# Patient Record
Sex: Male | Born: 1980 | Race: Black or African American | Hispanic: No | Marital: Married | Smoking: Current every day smoker
Health system: Southern US, Community
[De-identification: ages and names within clinical notes are randomized; demographics above are authoritative.]

## PROBLEM LIST (undated history)

## (undated) DIAGNOSIS — G4733 Obstructive sleep apnea (adult) (pediatric): Secondary | ICD-10-CM

## (undated) DIAGNOSIS — R001 Bradycardia, unspecified: Secondary | ICD-10-CM

## (undated) DIAGNOSIS — J45909 Unspecified asthma, uncomplicated: Secondary | ICD-10-CM

## (undated) DIAGNOSIS — I472 Ventricular tachycardia: Secondary | ICD-10-CM

## (undated) DIAGNOSIS — Z8489 Family history of other specified conditions: Secondary | ICD-10-CM

## (undated) DIAGNOSIS — I441 Atrioventricular block, second degree: Secondary | ICD-10-CM

## (undated) DIAGNOSIS — I4729 Other ventricular tachycardia: Secondary | ICD-10-CM

## (undated) DIAGNOSIS — E119 Type 2 diabetes mellitus without complications: Secondary | ICD-10-CM

## (undated) DIAGNOSIS — I10 Essential (primary) hypertension: Secondary | ICD-10-CM

## (undated) DIAGNOSIS — I5042 Chronic combined systolic (congestive) and diastolic (congestive) heart failure: Secondary | ICD-10-CM

## (undated) HISTORY — DX: Bradycardia, unspecified: R00.1

## (undated) HISTORY — DX: Other ventricular tachycardia: I47.29

## (undated) HISTORY — DX: Obstructive sleep apnea (adult) (pediatric): G47.33

## (undated) HISTORY — DX: Unspecified asthma, uncomplicated: J45.909

## (undated) HISTORY — DX: Chronic combined systolic (congestive) and diastolic (congestive) heart failure: I50.42

## (undated) HISTORY — DX: Ventricular tachycardia: I47.2

## (undated) HISTORY — DX: Essential (primary) hypertension: I10

## (undated) HISTORY — DX: Type 2 diabetes mellitus without complications: E11.9

## (undated) HISTORY — DX: Morbid (severe) obesity due to excess calories: E66.01

## (undated) HISTORY — DX: Atrioventricular block, second degree: I44.1

## (undated) HISTORY — PX: NO PAST SURGERIES: SHX2092

---

## 2000-09-17 ENCOUNTER — Encounter: Payer: Self-pay | Admitting: *Deleted

## 2000-09-17 ENCOUNTER — Ambulatory Visit (HOSPITAL_COMMUNITY): Admission: RE | Admit: 2000-09-17 | Discharge: 2000-09-17 | Payer: Self-pay | Admitting: *Deleted

## 2001-02-14 ENCOUNTER — Encounter: Payer: Self-pay | Admitting: Family Medicine

## 2001-02-14 ENCOUNTER — Ambulatory Visit (HOSPITAL_COMMUNITY): Admission: RE | Admit: 2001-02-14 | Discharge: 2001-02-14 | Payer: Self-pay | Admitting: Family Medicine

## 2001-04-14 ENCOUNTER — Encounter: Payer: Self-pay | Admitting: *Deleted

## 2001-04-14 ENCOUNTER — Ambulatory Visit: Admission: RE | Admit: 2001-04-14 | Discharge: 2001-04-14 | Payer: Self-pay | Admitting: *Deleted

## 2005-07-10 ENCOUNTER — Ambulatory Visit: Payer: Self-pay | Admitting: Internal Medicine

## 2005-07-24 ENCOUNTER — Ambulatory Visit: Payer: Self-pay | Admitting: Internal Medicine

## 2010-02-07 ENCOUNTER — Emergency Department (HOSPITAL_COMMUNITY): Admission: EM | Admit: 2010-02-07 | Discharge: 2010-02-07 | Payer: Self-pay | Admitting: Family Medicine

## 2010-02-07 ENCOUNTER — Emergency Department (HOSPITAL_COMMUNITY): Admission: EM | Admit: 2010-02-07 | Discharge: 2010-02-07 | Payer: Self-pay | Admitting: Emergency Medicine

## 2011-03-09 LAB — CBC
HCT: 44.6 % (ref 39.0–52.0)
Hemoglobin: 15.2 g/dL (ref 13.0–17.0)
MCHC: 34.1 g/dL (ref 30.0–36.0)
MCV: 83 fL (ref 78.0–100.0)
Platelets: 255 10*3/uL (ref 150–400)
RBC: 5.37 MIL/uL (ref 4.22–5.81)
RDW: 13.9 % (ref 11.5–15.5)
WBC: 8.5 10*3/uL (ref 4.0–10.5)

## 2011-03-09 LAB — DIFFERENTIAL
Basophils Absolute: 0 10*3/uL (ref 0.0–0.1)
Eosinophils Absolute: 0 10*3/uL (ref 0.0–0.7)
Lymphs Abs: 0.5 10*3/uL — ABNORMAL LOW (ref 0.7–4.0)
Neutrophils Relative %: 83 % — ABNORMAL HIGH (ref 43–77)

## 2011-03-09 LAB — BRAIN NATRIURETIC PEPTIDE: Pro B Natriuretic peptide (BNP): 34 pg/mL (ref 0.0–100.0)

## 2012-09-23 ENCOUNTER — Emergency Department (INDEPENDENT_AMBULATORY_CARE_PROVIDER_SITE_OTHER)
Admission: EM | Admit: 2012-09-23 | Discharge: 2012-09-23 | Disposition: A | Discharge status: Home / Self Care | Payer: Self-pay | Source: Home / Self Care | Attending: Family Medicine | Admitting: Family Medicine

## 2012-09-23 ENCOUNTER — Encounter (HOSPITAL_COMMUNITY): Payer: Self-pay | Admitting: *Deleted

## 2012-09-23 DIAGNOSIS — H109 Unspecified conjunctivitis: Secondary | ICD-10-CM

## 2012-09-23 MED ORDER — TOBRAMYCIN 0.3 % OP SOLN: 1.0000 [drp] | OPHTHALMIC | Status: DC

## 2012-09-23 NOTE — ED Provider Notes (Signed)
History     CSN: [id number]  Arrival date & time 09/23/12  1334   None     Chief Complaint  Patient presents with  . Conjunctivitis    (Consider location/radiation/quality/duration/timing/severity/associated sxs/prior treatment) Patient is a 31 y.o. male presenting with conjunctivitis. The history is provided by the patient. No language interpreter was used.  Conjunctivitis  The current episode started 3 to 5 days ago. The onset was gradual. The problem has been gradually worsening. The problem is moderate. Nothing relieves the symptoms. Nothing aggravates the symptoms. Associated symptoms include eye redness.   Pt complains of redness to both eyes.  Pt reports drainage in am Past Medical History  Diagnosis Date  . Hypertension     History reviewed. No pertinent past surgical history.  Family History  Problem Relation Age of Onset  . Family history unknown: Yes    History  Substance Use Topics  . Smoking status: Never Smoker   . Smokeless tobacco: Not on file  . Alcohol Use: No      Review of Systems  Eyes: Positive for redness.  All other systems reviewed and are negative.    Allergies  Penicillins  Home Medications  No current outpatient prescriptions on file.  BP 167/97  Pulse 92  Temp 98.5 F (36.9 C) (Oral)  Resp 16  SpO2 100%  Physical Exam  Nursing note and vitals reviewed. Constitutional: He is oriented to person, place, and time. He appears well-developed and well-nourished.  HENT:  Head: Normocephalic and atraumatic.  Right Ear: External ear normal.  Left Ear: External ear normal.  Eyes: Conjunctivae normal are normal. Pupils are equal, round, and reactive to light. Right eye exhibits discharge. Left eye exhibits discharge.  Neck: Normal range of motion.  Cardiovascular: Normal rate.   Pulmonary/Chest: Effort normal.  Musculoskeletal: Normal range of motion.  Neurological: He is alert and oriented to person, place, and time. He has  normal reflexes.  Skin: Skin is warm.    ED Course  Procedures (including critical care time)  Labs Reviewed - No data to display No results found.   1. Conjunctivitis       MDM  tobrex opth and warm compresses        Lonia Skinner [place], [place] 09/23/12 1500  Lonia Skinner [place], [place] 09/23/12 1720  Lonia Skinner [place], [place] 09/23/12 1721  Lonia Skinner [place], [place] 09/23/12 1721  Lonia Skinner [place], [place] 09/23/12 1722  Lonia Skinner [place], [place] 09/23/12 1725

## 2012-09-23 NOTE — ED Notes (Signed)
Pt reports redness and drainage in both eyes since friday

## 2012-09-23 NOTE — ED Provider Notes (Signed)
Medical screening examination/treatment/procedure(s) were performed by resident physician or non-physician practitioner and as supervising physician I was immediately available for consultation/collaboration.   Art Levan DOUGLAS MD.    Annaclaire Walsworth D Rayshell Goecke, MD 09/23/12 2059 

## 2015-02-17 ENCOUNTER — Encounter: Payer: Self-pay | Admitting: Family

## 2015-03-05 ENCOUNTER — Emergency Department (HOSPITAL_COMMUNITY): Payer: 59

## 2015-03-05 ENCOUNTER — Encounter (HOSPITAL_COMMUNITY): Payer: Self-pay | Admitting: *Deleted

## 2015-03-05 ENCOUNTER — Inpatient Hospital Stay (HOSPITAL_COMMUNITY)
Admission: EM | Admit: 2015-03-05 | Discharge: 2015-03-08 | DRG: 292 | Disposition: A | Discharge status: Home / Self Care | Payer: 59 | Attending: Oncology | Admitting: Oncology

## 2015-03-05 DIAGNOSIS — R188 Other ascites: Secondary | ICD-10-CM | POA: Diagnosis present

## 2015-03-05 DIAGNOSIS — R800 Isolated proteinuria: Secondary | ICD-10-CM | POA: Diagnosis not present

## 2015-03-05 DIAGNOSIS — G4733 Obstructive sleep apnea (adult) (pediatric): Secondary | ICD-10-CM

## 2015-03-05 DIAGNOSIS — I472 Ventricular tachycardia: Secondary | ICD-10-CM | POA: Diagnosis present

## 2015-03-05 DIAGNOSIS — I110 Hypertensive heart disease with heart failure: Secondary | ICD-10-CM | POA: Diagnosis present

## 2015-03-05 DIAGNOSIS — Z880 Allergy status to penicillin: Secondary | ICD-10-CM | POA: Diagnosis not present

## 2015-03-05 DIAGNOSIS — I5041 Acute combined systolic (congestive) and diastolic (congestive) heart failure: Secondary | ICD-10-CM

## 2015-03-05 DIAGNOSIS — E785 Hyperlipidemia, unspecified: Secondary | ICD-10-CM | POA: Diagnosis present

## 2015-03-05 DIAGNOSIS — N049 Nephrotic syndrome with unspecified morphologic changes: Secondary | ICD-10-CM | POA: Diagnosis present

## 2015-03-05 DIAGNOSIS — I43 Cardiomyopathy in diseases classified elsewhere: Secondary | ICD-10-CM | POA: Diagnosis present

## 2015-03-05 DIAGNOSIS — I5043 Acute on chronic combined systolic (congestive) and diastolic (congestive) heart failure: Secondary | ICD-10-CM | POA: Diagnosis present

## 2015-03-05 DIAGNOSIS — R0602 Shortness of breath: Secondary | ICD-10-CM | POA: Diagnosis not present

## 2015-03-05 DIAGNOSIS — J449 Chronic obstructive pulmonary disease, unspecified: Secondary | ICD-10-CM | POA: Diagnosis present

## 2015-03-05 DIAGNOSIS — I272 Other secondary pulmonary hypertension: Secondary | ICD-10-CM | POA: Diagnosis present

## 2015-03-05 DIAGNOSIS — R0600 Dyspnea, unspecified: Secondary | ICD-10-CM

## 2015-03-05 DIAGNOSIS — R609 Edema, unspecified: Secondary | ICD-10-CM

## 2015-03-05 DIAGNOSIS — E119 Type 2 diabetes mellitus without complications: Secondary | ICD-10-CM | POA: Diagnosis present

## 2015-03-05 DIAGNOSIS — I701 Atherosclerosis of renal artery: Secondary | ICD-10-CM | POA: Diagnosis present

## 2015-03-05 DIAGNOSIS — I441 Atrioventricular block, second degree: Secondary | ICD-10-CM | POA: Diagnosis not present

## 2015-03-05 DIAGNOSIS — E662 Morbid (severe) obesity with alveolar hypoventilation: Secondary | ICD-10-CM | POA: Diagnosis present

## 2015-03-05 DIAGNOSIS — I10 Essential (primary) hypertension: Secondary | ICD-10-CM

## 2015-03-05 DIAGNOSIS — F17210 Nicotine dependence, cigarettes, uncomplicated: Secondary | ICD-10-CM | POA: Diagnosis present

## 2015-03-05 DIAGNOSIS — I4510 Unspecified right bundle-branch block: Secondary | ICD-10-CM | POA: Diagnosis present

## 2015-03-05 DIAGNOSIS — I429 Cardiomyopathy, unspecified: Secondary | ICD-10-CM

## 2015-03-05 HISTORY — DX: Family history of other specified conditions: Z84.89

## 2015-03-05 LAB — BASIC METABOLIC PANEL
ANION GAP: 7 (ref 5–15)
BUN: 8 mg/dL (ref 6–23)
CO2: 31 mmol/L (ref 19–32)
Calcium: 8.6 mg/dL (ref 8.4–10.5)
Chloride: 103 mmol/L (ref 96–112)
Creatinine, Ser: 1.07 mg/dL (ref 0.50–1.35)
GFR calc Af Amer: >90 mL/min (ref >90)
GFR calc non Af Amer: 90 mL/min — ABNORMAL LOW (ref >90)
GLUCOSE: 115 mg/dL — AB (ref 70–99)
Potassium: 3.9 mmol/L (ref 3.5–5.1)
Sodium: 141 mmol/L (ref 135–145)

## 2015-03-05 LAB — I-STAT TROPONIN, ED: Troponin i, poc: 0.05 ng/mL (ref 0.00–0.08)

## 2015-03-05 LAB — URINALYSIS, ROUTINE W REFLEX MICROSCOPIC
Bilirubin Urine: NEGATIVE
GLUCOSE, UA: NEGATIVE mg/dL
Ketones, ur: NEGATIVE mg/dL
LEUKOCYTES UA: NEGATIVE
Nitrite: NEGATIVE
Protein, ur: 100 mg/dL — AB
Specific Gravity, Urine: 1.012 (ref 1.005–1.030)
UROBILINOGEN UA: 1 mg/dL (ref 0.0–1.0)
pH: 5.5 (ref 5.0–8.0)

## 2015-03-05 LAB — CBC
HEMATOCRIT: 44.2 % (ref 39.0–52.0)
HEMOGLOBIN: 14.5 g/dL (ref 13.0–17.0)
MCH: 26.3 pg (ref 26.0–34.0)
MCHC: 32.8 g/dL (ref 30.0–36.0)
MCV: 80.2 fL (ref 78.0–100.0)
PLATELETS: 346 10*3/uL (ref 150–400)
RBC: 5.51 MIL/uL (ref 4.22–5.81)
RDW: 15.2 % (ref 11.5–15.5)
WBC: 7.9 10*3/uL (ref 4.0–10.5)

## 2015-03-05 LAB — URINE MICROSCOPIC-ADD ON

## 2015-03-05 LAB — HEPATIC FUNCTION PANEL
ALT: 36 U/L (ref 0–53)
AST: 36 U/L (ref 0–37)
Albumin: 2.9 g/dL — ABNORMAL LOW (ref 3.5–5.2)
Alkaline Phosphatase: 46 U/L (ref 39–117)
BILIRUBIN DIRECT: 0.2 mg/dL (ref 0.0–0.5)
BILIRUBIN INDIRECT: 0.8 mg/dL (ref 0.3–0.9)
Total Bilirubin: 1 mg/dL (ref 0.3–1.2)
Total Protein: 5.8 g/dL — ABNORMAL LOW (ref 6.0–8.3)

## 2015-03-05 LAB — GLUCOSE, CAPILLARY: Glucose-Capillary: 103 mg/dL — ABNORMAL HIGH (ref 70–99)

## 2015-03-05 LAB — BRAIN NATRIURETIC PEPTIDE: B NATRIURETIC PEPTIDE 5: 338.9 pg/mL — AB (ref 0.0–100.0)

## 2015-03-05 LAB — TROPONIN I: Troponin I: 0.07 ng/mL — ABNORMAL HIGH (ref <0.031)

## 2015-03-05 MED ORDER — FUROSEMIDE 10 MG/ML IJ SOLN
40.0000 mg | Freq: Once | INTRAMUSCULAR | Status: AC
  Administered 2015-03-05: 40 mg via INTRAVENOUS
  Filled 2015-03-05: qty 4

## 2015-03-05 MED ORDER — ENOXAPARIN SODIUM 80 MG/0.8ML ~~LOC~~ SOLN
80.0000 mg | SUBCUTANEOUS | Status: DC
  Administered 2015-03-05 – 2015-03-07 (×3): 80 mg via SUBCUTANEOUS
  Filled 2015-03-05 (×4): qty 0.8

## 2015-03-05 MED ORDER — NITROGLYCERIN 2 % TD OINT
0.5000 [in_us] | TOPICAL_OINTMENT | Freq: Once | TRANSDERMAL | Status: AC
  Administered 2015-03-05: 0.5 [in_us] via TOPICAL
  Filled 2015-03-05: qty 1

## 2015-03-05 MED ORDER — IPRATROPIUM-ALBUTEROL 0.5-2.5 (3) MG/3ML IN SOLN
3.0000 mL | Freq: Four times a day (QID) | RESPIRATORY_TRACT | Status: DC
  Administered 2015-03-05 – 2015-03-07 (×6): 3 mL via RESPIRATORY_TRACT
  Filled 2015-03-05 (×7): qty 3

## 2015-03-05 NOTE — H&P (Signed)
Date: 03/05/2015               Patient Name:  Roger Ward MRN: [id number]  DOB: [birth year] Age / Sex: 34 y.o., male   PCP: No Pcp Per Patient         Medical Service: Internal Medicine Teaching Service         Attending Physician: Dr. Levert Feinstein, MD    First Contact: Dr. Eleonore Chiquito Pager: [phone]  Second Contact: Dr. Inocente Salles Pager: [phone]       After Hours (After 5p/  First Contact Pager: [phone]  weekends / holidays): Second Contact Pager: [phone]   Chief Complaint: shortness of breath for 4 months, swelling all over body for 1 week  History of Present Illness: Mr. Caesare Ward is a 34 yo man with no known medical history who presented to the hospital due to his shortness of breath and edema. He first noted shortness of breath about 4 months ago. He has had an occasionally productive cough with wheezing since that time. The shortness of breath is worst when he ambulates and at night. Though he has only been sleeping on one pillow, he does admit to waking up gasping for breath. He believes the shortness of breath has gotten worse since he first noticed it.   He noticed the swelling in his legs, then neck, face and whole body about a week ago. It has also gotten worse. He denies nausea, vomiting, diarrhea, fever, chills or urinary symptoms. He admits to a high fat, high salt diet. He is a Merchandiser, retail at [employer] and has had exposure to plastic floors at work in the past; he currently works in an office. He has never lived with a bird or worked on a farm. He has a 20 pack year history of smoking.   Meds: No current facility-administered medications for this encounter.    Allergies: Allergies as of 03/05/2015 - Review Complete 03/05/2015  Allergen Reaction Noted  . Penicillins  09/23/2012   Past Medical History  Diagnosis Date  . Hypertension   . Family history of adverse reaction to anesthesia     mother and aunt  both put to sleep for surgery and  "stopped breathing"   History reviewed. No pertinent past surgical history. Family History  Problem Relation Age of Onset  . Hypertension Mother   . Hyperlipidemia Mother   . Heart failure Mother   . Multiple sclerosis Mother   . Breast cancer Maternal Grandmother   . Other Maternal Uncle    History   Social History  . Marital Status: Single    Spouse Name: N/A  . Number of Children: N/A  . Years of Education: N/A   Occupational History  . Supervisor at [employer]    Social History Main Topics  . Smoking status: Current Every Day Smoker -- 0.25 packs/day for 19 years    Types: Cigarettes    Start date: 01/05/1996  . Smokeless tobacco: Never Used     Comment: Already decreasaed smoking  . Alcohol Use: 0.6 - 1.2 oz/week    1-2 Standard drinks or equivalent per week  . Drug Use: No  . Sexual Activity: Yes    Birth Control/ Protection: Condom   Other Topics Concern  . Not on file   Social History Narrative    Review of Systems: See HPI   Physical Exam: Blood pressure 153/104, pulse 98, temperature 98.5 F (36.9 C), temperature source Oral, resp. rate  22, height  (1.803 m), weight 351 lb 3.2 oz (159.303 kg), SpO2 100 %.on room air Appearance: in NAD, lying in bed with wife at bedside, sitting at 45 degree angle HEENT: AT/[place], PERRL, EOMi, no periorbital edema, full neck and face Heart: distant heart sounds, RRR, normal S1S2 Lungs: distant lung sounds, diffuse rhonchi and expiratory wheezes, slightly increased effort Abdomen: BS+, soft, nontender, extensive striae across abdomen that patient says are new, trace edema Musculoskeletal: normal range of motion Extremities: 2+ pitting edema to knees Neurologic: A&Ox3, grossly intact Skin: no rashes or lesions   Lab results: Basic Metabolic Panel:  Recent Labs  16/10/96 1155  NA 141  K 3.9  CL 103  CO2 31  GLUCOSE 115*  BUN 8  CREATININE 1.07  CALCIUM 8.6   CBC:  Recent Labs   03/05/15 1155  WBC 7.9  HGB 14.5  HCT 44.2  MCV 80.2  PLT 346   Urinalysis:  Recent Labs  03/05/15 1155  COLORURINE YELLOW  LABSPEC 1.012  PHURINE 5.5  GLUCOSEU NEGATIVE  HGBUR TRACE*  BILIRUBINUR NEGATIVE  KETONESUR NEGATIVE  PROTEINUR 100*  UROBILINOGEN 1.0  NITRITE NEGATIVE  LEUKOCYTESUR NEGATIVE    Imaging results:  Dg Chest 2 View  03/05/2015   CLINICAL DATA:  Pt states he has had a non-productive cough and SOB x 4months. He also claims swelling of his face, ankles and abdomen x 1week. No prior injury or surgery to his chest. Current smoker: 1/2 ppd  EXAM: CHEST  2 VIEW  COMPARISON:  02/07/2010  FINDINGS: Cardiac silhouette is borderline enlarged and larger than it was on the prior study. There is central vascular congestion and bilateral interstitial thickening, also new. No mediastinal or hilar masses. No focal areas of lung consolidation. No pleural effusion or pneumothorax.  Bony thorax is intact.  IMPRESSION: 1. Borderline enlargement of the cardiopericardial silhouette and mild bilateral interstitial thickening. Findings may reflect mild interstitial edema. Interstitial inflammation or infection is possible.   Electronically Signed   By: Amie Portland M.D.   On: 03/05/2015 12:04    Other results: EKG: Rate 105, sinus tachycardia, left-axis deviation, RBBB, conduction delay, PVCs  Assessment & Plan by Problem: Principal Problem:   Dyspnea Active Problems:   Edema  Dyspnea and Edema: These symptoms are likely due to fluid overload of cardiac etiology. He has no known history of heart failure, but his symptoms and EKG are worrisome for it. He admits to a poor diet and a family history of CHF (mother). Rhonchi and wheezing noted on exam. May have been cardiac wheezes, but the patient has a history of smoking and was found to have interstitial thickening on chest xray that could reflect mild interstitial inflammation. - Duonebs - Trend troponins - Telemetry -  Echocardiogram - Consider chest CT tomorrow to better visualize possible interstitial disease - Repeat EKG - Lasix 40 mg once; will observe and re-dose tomorrow - Ins & outs - Lipid panel  - HgbA1c - TSH  Hypertension: Patient has never taken medications for his hypertension, but has been told on prior ED visits that he was hypertensive. Currently 121/73, but previously 172/121. Getting IV lasix. - Trend BPs; consider starting medication for hypertension if returns to hypertension  Diet: Heart healthy  DVT Ppx: Lovenox  Dispo: Disposition is deferred at this time, awaiting improvement of current medical problems. Anticipated discharge in approximately 2-3 day(s).   The patient does not have a current PCP (No Pcp Per Patient) and does not  know need an [facility] hospital follow-up appointment after discharge.  The patient does not have transportation limitations that hinder transportation to clinic appointments.  Signed: Stark Bray, MD 03/05/2015, 5:15 PM

## 2015-03-05 NOTE — ED Notes (Signed)
Internal medicine at bedside

## 2015-03-05 NOTE — Progress Notes (Signed)
Report taken from ED RN.   Ainsleigh Kakos [employer], RN-BC, [employer]

## 2015-03-05 NOTE — ED Notes (Signed)
Pt reports having a cough for months and now sob x 3 months. Reports swelling to ankles and face. Airway intact at triage.

## 2015-03-05 NOTE — ED Notes (Signed)
Attempted report to 6E 

## 2015-03-05 NOTE — H&P (Signed)
Roger Ward is an 34 y.o. male.  Chief Complaint: shortness of breath and swelling   HPI  34 y/o obese male with no significant past medical history who presented to the ED with 3-4 months of shortness of breath and recent gradual onset swelling of the legs, abdomen and face/throat over the past 1-2 weeks. Patient states that he has SOB on exertion and when performing activities of daily living but not as much during rest. He also states that the SOB is worse at night and wakes him sometimes gasping for air, but states he only uses 1 pillow. He admits to a cough that is sometimes productive for clear/white phlegm. Patient denies chest pain/dizziness/headache/difficulty swallowing/nausea/vomiting/diarrhea/abdominal pain/dysuria or frequency/pain in his joints or muscles. He states that the swelling started 1-2 weeks ago and has gradually gotten worse, and states that propping his legs up while at rest helps with the swelling but hasn't noticed anything that makes it worse. He admits to a poor diet high in salt and fat. He has been smoking since the age of 40 but now only smokes 1-2 cigarettes a day. He states he only drinks alcohol once every other weekend and denies any illicit drug use. He is not currently on any medications and has taken no medications to alleviate his SOB or swelling. No recent illness/sick contacts/travel. 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 He also denies any exposure to animals (including farm animals and birds), no hot tub use. The patient does not have a PCP and states he has been told on previous clinic visits for acute illness that he has HTN but has never been prescribed any medications for it.   ED Procedures: EKG, Troponins, Chest X-ray, Labs (CBC, BMP, BNP, TSH, A1c, Lipid Panel, Hepatic Function, U/A)  No current facility-administered medications for this encounter.  Current  outpatient prescriptions:  .  tobramycin (TOBREX) 0.3 % ophthalmic solution, Place 1 drop into both eyes every 4 (four) hours. (Patient not taking: Reported on 03/05/2015), Disp: 5 mL, Rfl: 0   Allergies  Allergen Reactions  . Penicillins Swelling   Past Medical History  Diagnosis Date  . Hypertension    History reviewed. No pertinent past surgical history.   Family History  Problem Relation Age of Onset  . Hypertension Mother   . Hyperlipidemia Mother   . Heart failure Mother   . Breast cancer Maternal Grandmother   . Multiple sclerosis Mother   . Prostate cancer Maternal Uncle    History   Social History  . Marital Status: Single    Spouse Name: N/A  . Number of Children: N/A  . Years of Education: N/A   Occupational History  . 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 Smoking status: Current Every Day Smoker -- 0.50 packs/day for 19 years    Types: Cigarettes    Start date: 01/05/1996  . Smokeless tobacco: Not on file  . Alcohol Use: 0.6 - 1.2 oz/week    1-2 Standard drinks or equivalent per week  . Drug Use: No  . Sexual Activity: Yes   Other Topics Concern  . Not on file   Social History Narrative    Review of Systems  Constitutional: Negative for fever, chills, malaise/fatigue and diaphoresis.  HENT: Positive for congestion. Negative for sore throat.   Eyes: Negative for blurred vision.  Respiratory: Positive for cough, sputum  production, shortness of breath and wheezing. Negative for hemoptysis.   Cardiovascular: Positive for orthopnea and leg swelling. Negative for chest pain and palpitations.  Gastrointestinal: Negative for heartburn, nausea, vomiting, abdominal pain and diarrhea.  Genitourinary: Negative for dysuria, urgency and frequency.  Musculoskeletal: Negative for joint pain and neck pain.  Skin: Negative for itching and rash.  Neurological: Negative for dizziness, tingling, focal weakness, weakness and headaches.   Endo/Heme/Allergies: Negative for environmental allergies.      Filed Vitals:   03/05/15 1500 03/05/15 1530 03/05/15 1600 03/05/15 1613  BP: 168/114 167/112 153/104   Pulse: 94 111 98   Temp:    98.5 F (36.9 C)  TempSrc:    Oral  Resp: _0 Height:      Weight:      SpO2: 100% 98% 100%    Physical Exam  Constitutional: He is oriented to person, place, and time. He appears well-developed and well-nourished. He is cooperative. No distress.  obese  HENT:  Right Ear: External ear normal.  Left Ear: External ear normal.  Mouth/Throat: Oropharynx is clear and moist. No oropharyngeal exudate.  Facial/cheek edema.   Eyes: EOM are normal. Pupils are equal, round, and reactive to light. Right conjunctiva is injected. Left conjunctiva is injected. No scleral icterus.  Neck: Normal range of motion. No JVD present.  Bilateral edema.   Cardiovascular: Normal rate, regular rhythm, S1 normal and S2 normal.  Exam reveals distant heart sounds and decreased pulses. Exam reveals no gallop, no S3, no S4 and no friction rub.   No murmur heard. Respiratory: He has rhonchi.  End expiratory wheezes. No crackles.   GI: Bowel sounds are normal. He exhibits distension and ascites. He exhibits no fluid wave, no abdominal bruit and no pulsatile midline mass. There is no hepatosplenomegaly. There is no tenderness. There is no rebound, no guarding and no CVA tenderness. No hernia.  Prominent striae noted bilaterally in lower quadrants.   Musculoskeletal: Normal range of motion. He exhibits edema.  Neurological: He is alert and oriented to person, place, and time. No cranial nerve deficit. Coordination normal.  Skin: Skin is warm. No rash noted. He is not diaphoretic. No cyanosis or erythema. Nails show no clubbing.  Psychiatric: He has a normal mood and affect. Thought content normal.     BMP Latest Ref Rng 03/05/2015  Glucose 70 - 99 mg/dL 115(H)  BUN 6 - 23 mg/dL 8  Creatinine 0.50 - 1.35 mg/dL  1.07  Sodium 135 - 145 mmol/L 141  Potassium 3.5 - 5.1 mmol/L 3.9  Chloride 96 - 112 mmol/L 103  CO2 19 - 32 mmol/L 31  Calcium 8.4 - 10.5 mg/dL 8.6   CBC Latest Ref Rng 03/05/2015 02/07/2010  WBC 4.0 - 10.5 K/uL 7.9 8.5  Hemoglobin 13.0 - 17.0 g/dL 14.5 15.2  Hematocrit 39.0 - 52.0 % 44.2 44.6  Platelets 150 - 400 K/uL 346 255   Lipid Panel     Component Value Date/Time   CHOL 166 03/06/2015 0155   TRIG 248* 03/06/2015 0155   HDL 42 03/06/2015 0155   CHOLHDL 4.0 03/06/2015 0155   VLDL 50* 03/06/2015 0155   LDLCALC 74 03/06/2015 0155     Hepatic Function Latest Ref Rng 03/05/2015  Total Protein 6.0 - 8.3 g/dL 5.8(L)  Albumin 3.5 - 5.2 g/dL 2.9(L)  AST 0 - 37 U/L 36  ALT 0 - 53 U/L 36  Alk Phosphatase 39 - 117 U/L 46  Total Bilirubin 0.3 -  1.2 mg/dL 1.0  Bilirubin, Direct 0.0 - 0.5 mg/dL 0.2     Recent Labs  03/05/15 1155  BNP 338.9*  BNP from 2011: 34.0   Recent Labs  03/05/15 1228  TROPIPOC 0.05   Urinalysis    Component Value Date/Time   COLORURINE YELLOW 03/05/2015 1155   APPEARANCEUR CLEAR 03/05/2015 1155   LABSPEC 1.012 03/05/2015 1155   PHURINE 5.5 03/05/2015 1155   GLUCOSEU NEGATIVE 03/05/2015 1155   HGBUR TRACE* 03/05/2015 1155   BILIRUBINUR NEGATIVE 03/05/2015 1155   KETONESUR NEGATIVE 03/05/2015 1155   PROTEINUR 100* 03/05/2015 1155   UROBILINOGEN 1.0 03/05/2015 1155   NITRITE NEGATIVE 03/05/2015 1155   LEUKOCYTESUR NEGATIVE 03/05/2015 1155  Urine microscopic add-on (03/05/2015): positive for hyaline casts.  Lab Results  Component Value Date   TSH 0.568 03/05/2015    No results found for: HGBA1C   Imaging:  Chest X-ray: Borderline enlargement of the cardiopericardial silhouette and mild bilateral interstitial thickening. Findings may reflect mild interstitial edema.  EKG: Rate 105. Sinus Tachycardia with occasional premature ventricular complexes, LAD, RBBB   Assessment/Plan 34 y/o obese male with no significant past medical history  who presented to the ED with 3-4 months of shortness of breath and recent gradual onset swelling of the legs, abdomen and face/throat over the past 1-2 weeks.   Given the patients decreased albumin (2.9 g/dL), protein detected on U/A, uncontrolled HTN and gross edema, nephrotic syndrome is at the top of my differential (although creatinine 1.07, BUN 8). Given the patient's 1) history with obesity 2) smoking and a poor diet 3) symptoms of SOB that is worse at night with gradual onset swelling 4) family history of CHF and HTN 5) physical exam findings of diffuse edema, pulmonary wheezing and rhonchous breathing and 6) chest x-ray finding of cardiac enlargement with possible interstitial edema, heart failure/Cardiac Asthma is also at the top of my differential. Given his smoking status and the lung findings on x-ray, an interstitial etiology is also high on my differential. He seems too young for COPD with only a 10-20 pack year history but interstitial lung disease/pulmonary hypertension possibly leading to cor pulmonale is also on my differential given the EKG findings of RBBB, gross edema on physical exam, and apparent OSA. Cancer/mass (SVC for example) also considered given his smoking status (less likely given absent findings on chest x-ray). Interstitial inflammation or infection is possible (less likely given no fever, leukocytosis, sick contacts/exposures, history or crackles/consolidation heard on physical exam). Chest X-ray was negative for pleural effusion/pneumothorax. PE unlikely given 49-monthhistory, normal O2 sats on room air, n/l CBC, no cyanosis or clubbing on physical exam, no history of PE/DVT.   Shortness of Breath: Likely secondary to fluid overload from nephrotic syndrome or cardiopulmonary etiology as discussed above.  - Trend troponins - Repeat EKG - Echocardiogram - consider CT imaging of the chest for pulmonary workup in the setting of unremarkable echo.   - Duonebs [id number]  -  Supplemental O2 if needed - Diet: Heart Healthy Diet. Diet/Nutrition counseling needed. - Pending labs for lipid panel, hepatic function, TSH, A1c - Repeat Bmet - Telemetry    Edema: Patient not taking any meds. Likely secondary to underlying nephrotic syndrome (U/A protein 100 mg/dL, albumin 2.9 g/dL) and/or cardiopulmonary disease. Also consider idiopathic or allergic angioedema, infection, lymphatic system obstruction (unlikely given unremarkable labs and absence of associated symptoms). Liver dysfunction and thyroid dysfunction less likely given unremarkable TSH and hepatic function panel outside of decreased albumin and  no reported history of hepatitis or alcohol/IV drug abuse. - IV lasix - 57m -  See how he's doing tomorrow. Consider increasing to 80 bid if he doesn't seem to be loosing fluid and if his kidney function remains normal.  - Monitor Ins/Outs - Consider 24hr urine with protein/creatinine ratio  HTN: BP stabilized from 172/21 on admission to 121/73. Patient has been told that he has HTN at previous acute care clinic visits but has never taken any medication. Monitor and consider prescription as an outpatient (hydrocholorthiazide + ACEi or ARB).  - IV Lasix 435m- Trend BP - Lifestyle/diet modification and counseling.   OSA: Patient dropping O2 sats on room air at night (into the 60s). Consider sleep study, CPAP. Could be leading to/worsening pulmonary HTN causing right heart dysfunction and could also be worsening kidney function secondary to hypoperfusion.   Obesity: Diet/lifestyle counseling. Exercise planning.   Diet: Heart Healthy   DVT PPX: Lovenox [id number]      CumMindi Slicker19/2016, 4:38 PM

## 2015-03-05 NOTE — Progress Notes (Signed)
New Admission Note:   Arrival Method: Via stretcher from ED with EMT Charlie  Mental Orientation: Alert and Oriented X4 Telemetry: Placed on box 6E06 per MD orders Assessment: Completed Skin: Warm, dry and intact. Bilateral lower extremity edema  IV: Clean, dry and intact. NSL at this time Pain: No pain stated at this time  Tubes: N/A Safety Measures: Safety Fall Prevention Plan has been given, discussed and signed Admission: Completed [address] Orientation: Patient has been orientated to the room, unit and staff.  Family: Mother at the bedside   Orders have been reviewed and implemented. Will continue to monitor the patient. Call light has been placed within reach and bed alarm has been activated.   [employer], RN-BC, RN3 Phone number: [phone] (Late entry)

## 2015-03-05 NOTE — ED Notes (Signed)
Informed patient of room #

## 2015-03-05 NOTE — ED Provider Notes (Signed)
CSN: [id number]     Arrival date & time 03/05/15  1108 History   First MD Initiated Contact with Patient 03/05/15 1132     Chief Complaint  Patient presents with  . Cough  . Shortness of Breath      HPI  Patient presents with concern of dyspnea, cough. Symptoms have progressed over the past few months, and over the past week in particular the patient has new swelling in ankles, face. Patient denies chest pain, belly pain, confusion, disorientation. Symptoms are worse with supine positioning, exertion. No new medication, diet, herbal supplement. No new travel.  Patient smokes, and we discussed smoking cessation.   Patient has not seen a physician for this illness. No clear precipitating, alleviating, exacerbating factors.   Past Medical History  Diagnosis Date  . Hypertension    History reviewed. No pertinent past surgical history. Family History  Problem Relation Age of Onset  . Family history unknown: Yes   History  Substance Use Topics  . Smoking status: Never Smoker   . Smokeless tobacco: Not on file  . Alcohol Use: No    Review of Systems  Constitutional:       Per HPI, otherwise negative  HENT:       Per HPI, otherwise negative  Respiratory:       Per HPI, otherwise negative  Cardiovascular:       Per HPI, otherwise negative  Gastrointestinal: Negative for vomiting.  Endocrine:       Negative aside from HPI  Genitourinary:       Neg aside from HPI   Musculoskeletal:       Per HPI, otherwise negative  Skin: Negative.   Neurological: Negative for syncope.      Allergies  Penicillins  Home Medications   Prior to Admission medications   Medication Sig Start Date End Date Taking? Authorizing Provider  tobramycin (TOBREX) 0.3 % ophthalmic solution Place 1 drop into both eyes every 4 (four) hours. 09/23/12   Lonia Skinner Sofia, PA-C   BP 172/121 mmHg  Pulse 103  Temp(Src) 98.2 F (36.8 C) (Oral)  Resp 23  Ht  (1.803 m)  Wt 351 lb 3.2 oz  (159.303 kg)  BMI 49.00 kg/m2  SpO2 100% Physical Exam  Constitutional: He is oriented to person, place, and time. He appears well-developed. No distress.  Obese and edematous male awake and alert, interacting appropriate.   HENT:  Head: Normocephalic and atraumatic.  Eyes: Conjunctivae and EOM are normal.  Cardiovascular: Normal rate and regular rhythm.   Pulmonary/Chest: Effort normal. No stridor. No respiratory distress.  Abdominal: He exhibits no distension.  Musculoskeletal: He exhibits edema. He exhibits no tenderness.  Neurological: He is alert and oriented to person, place, and time.  Skin: Skin is warm and dry.  Psychiatric: He has a normal mood and affect.  Nursing note and vitals reviewed.   ED Course  Procedures (including critical care time) Labs Review Labs Reviewed  BASIC METABOLIC PANEL - Abnormal; Notable for the following:    Glucose, Bld 115 (*)    GFR calc non Af Amer 90 (*)    All other components within normal limits  BRAIN NATRIURETIC PEPTIDE - Abnormal; Notable for the following:    B Natriuretic Peptide 338.9 (*)    All other components within normal limits  URINALYSIS, ROUTINE W REFLEX MICROSCOPIC - Abnormal; Notable for the following:    Hgb urine dipstick TRACE (*)    Protein, ur 100 (*)  All other components within normal limits  URINE MICROSCOPIC-ADD ON - Abnormal; Notable for the following:    Casts HYALINE CASTS (*)    All other components within normal limits  CBC  I-STAT TROPOININ, ED    Imaging Review Dg Chest 2 View  03/05/2015   CLINICAL DATA:  Pt states he has had a non-productive cough and SOB x 85months. He also claims swelling of his face, ankles and abdomen x 1week. No prior injury or surgery to his chest. Current smoker: 1/2 ppd  EXAM: CHEST  2 VIEW  COMPARISON:  02/07/2010  FINDINGS: Cardiac silhouette is borderline enlarged and larger than it was on the prior study. There is central vascular congestion and bilateral  interstitial thickening, also new. No mediastinal or hilar masses. No focal areas of lung consolidation. No pleural effusion or pneumothorax.  Bony thorax is intact.  IMPRESSION: 1. Borderline enlargement of the cardiopericardial silhouette and mild bilateral interstitial thickening. Findings may reflect mild interstitial edema. Interstitial inflammation or infection is possible.   Electronically Signed   By: Amie Portland M.D.   On: 03/05/2015 12:04     EKG Interpretation   Date/Time:  Saturday March 05 2015 11:30:32 EDT Ventricular Rate:  105 PR Interval:  152 QRS Duration: 146 QT Interval:  392 QTC Calculation: 518 R Axis:   -62 Text Interpretation:  Sinus tachycardia with occasional Premature  ventricular complexes Left axis deviation Right bundle branch block  Abnormal ECG Sinus tachycardia Non-specific intra-ventricular conduction  delay Premature ventricular complexes No significant change since last  tracing Abnormal ekg Confirmed by Gerhard Munch  MD ([id number]) on 03/05/2015  11:33:48 AM     After the initial evaluation, with elevated blood pressure, 170/120, patient had nitroglycerin paste, was on monitors.  Cardiac 101, sinus tach, abnormal  Pulse oxygen 97% room air normal   2:47 PM Patient aware of all results.   MDM   This previously well male presents with new dyspnea, swelling. Patient is awake and alert, afebrile, but his evaluation is notable for 3 notable abnormalities. Patient has evidence for interstitial changes in the lungs, elevated BNP, and evidence for nephropathy. Given the patient's notable recent progression of edema, his worsening dyspnea, patient was admitted for further evaluation and management. With no specific chest pain, and reassuring EKG, ongoing coronary ischemia is not suspected. No evidence for infection.     Gerhard Munch, MD 03/05/15 [phone]

## 2015-03-06 DIAGNOSIS — R0600 Dyspnea, unspecified: Secondary | ICD-10-CM

## 2015-03-06 LAB — BASIC METABOLIC PANEL
Anion gap: 6 (ref 5–15)
Anion gap: 3 — ABNORMAL LOW (ref 5–15)
BUN: 10 mg/dL (ref 6–23)
BUN: 9 mg/dL (ref 6–23)
CHLORIDE: 102 mmol/L (ref 96–112)
CHLORIDE: 101 mmol/L (ref 96–112)
CO2: 34 mmol/L — AB (ref 19–32)
CO2: 38 mmol/L — AB (ref 19–32)
CREATININE: 1.22 mg/dL (ref 0.50–1.35)
Calcium: 8.6 mg/dL (ref 8.4–10.5)
Calcium: 8.7 mg/dL (ref 8.4–10.5)
Creatinine, Ser: 1.12 mg/dL (ref 0.50–1.35)
GFR calc Af Amer: >90 mL/min (ref >90)
GFR calc Af Amer: 89 mL/min — ABNORMAL LOW (ref >90)
GFR calc non Af Amer: 77 mL/min — ABNORMAL LOW (ref >90)
GFR, EST NON AFRICAN AMERICAN: 85 mL/min — AB (ref >90)
GLUCOSE: 113 mg/dL — AB (ref 70–99)
Glucose, Bld: 119 mg/dL — ABNORMAL HIGH (ref 70–99)
Potassium: 4.2 mmol/L (ref 3.5–5.1)
Potassium: 3.7 mmol/L (ref 3.5–5.1)
SODIUM: 142 mmol/L (ref 135–145)
Sodium: 142 mmol/L (ref 135–145)

## 2015-03-06 LAB — LIPID PANEL
CHOL/HDL RATIO: 4 ratio
Cholesterol: 166 mg/dL (ref 0–200)
HDL: 42 mg/dL (ref >39)
LDL CALC: 74 mg/dL (ref 0–99)
Triglycerides: 248 mg/dL — ABNORMAL HIGH (ref <150)
VLDL: 50 mg/dL — AB (ref 0–40)

## 2015-03-06 LAB — TROPONIN I
TROPONIN I: 0.06 ng/mL — AB (ref <0.031)
Troponin I: 0.06 ng/mL — ABNORMAL HIGH (ref <0.031)

## 2015-03-06 LAB — TSH: TSH: 0.568 u[IU]/mL (ref 0.350–4.500)

## 2015-03-06 LAB — PROTEIN / CREATININE RATIO, URINE
Creatinine, Urine: 125.17 mg/dL
PROTEIN CREATININE RATIO: 0.66 — AB (ref 0.00–0.15)
Total Protein, Urine: 82 mg/dL

## 2015-03-06 LAB — MAGNESIUM: MAGNESIUM: 2.1 mg/dL (ref 1.5–2.5)

## 2015-03-06 MED ORDER — PERFLUTREN LIPID MICROSPHERE
1.0000 mL | INTRAVENOUS | Status: AC | PRN
  Administered 2015-03-06: 2 mL via INTRAVENOUS
  Filled 2015-03-06: qty 10

## 2015-03-06 MED ORDER — FUROSEMIDE 10 MG/ML IJ SOLN
40.0000 mg | Freq: Two times a day (BID) | INTRAMUSCULAR | Status: DC
  Administered 2015-03-06 – 2015-03-07 (×3): 40 mg via INTRAVENOUS
  Filled 2015-03-06 (×5): qty 4

## 2015-03-06 NOTE — Progress Notes (Signed)
Echocardiogram 2D Echocardiogram with Definity has been performed.  Roger Ward 03/06/2015, 11:01 AM

## 2015-03-06 NOTE — Progress Notes (Signed)
Principal Problem:   Dyspnea Active Problems:   Edema      Code Status Orders        Start     Ordered   03/05/15 1844  Full code   Continuous     03/05/15 1843      Length of Stay (days):1   SUBJECTIVE/24 HOUR EVENTS: 34 y.o. male with PMH significant for HTN who presented yesterday to the ED with 3-4 months of SOB and 1-2 weeks of gradually worsening gross edema found to have low serum albumin, a protein positive U/A, abnormal EKG with RBBB and LAD, chest x-ray evident for mild cardiomegaly and interstitial thickening, increased troponins and increased BNP. Patient's symptoms are unchanged from yesterday - gross edema and continued shortness of breath with no overnight events other than episodes of O2 desaturation on room air, likely secondary to OSA per wife's description and history. Patient was sleeping with CPAP when we entered the room. Patient initiated on 24 hr urine collection for possible nephrotic syndrome. Echocardiogram pending. Patient continues to deny chest pain/nausea/vomiting/diarrhea/dysuria.    OBJECTIVE: Filed Vitals:   03/06/15 0147 03/06/15 0215 03/06/15 0446 03/06/15 0851  BP:   140/98   Pulse:  82 97   Temp:   97.9 F (36.6 C)   TempSrc:   Oral   Resp:  20 18   Height:      Weight:      SpO2: 100% 100% 97% 94%    Intake/Output Summary (Last 24 hours) at 03/06/15 1045 Last data filed at 03/06/15 0907  Gross per 24 hour  Intake    820 ml  Output   3700 ml  Net  -2880 ml    Intake/Output last 3 shifts: I/O last 3 completed shifts: In: 70 [P.O.:820] Out: 3300 [Urine:3300]  Allergies  Allergen Reactions  . Penicillins Swelling    Medications: Scheduled Meds: . enoxaparin (LOVENOX) injection  80 mg Subcutaneous Q24H  . ipratropium-albuterol  3 mL Nebulization Q6H   Continuous Infusions:  PRN Meds:.perflutren lipid microspheres (DEFINITY) IV suspension  Physical Exam: GEN: NAD, AAOx3, WDWN, obese  HEENT: MMM, EOMI, PERRLA,  conjunctival injection NECK: no JVD, no carotid bruits CV: distant heart sounds, RRR, S1S2nl, no murmurs/rubs/gallops, no S3/S4 PULM: end-expiratory and some inspiratory wheezing bilaterally. Rhonchorous breathing. No crackles.  ABD: normal/active bowel sounds, distended, non-tender. No rebound, no guarding, unable to appreciate hepatosplenomegaly. Bilateral striae in the lower abdominal quadrants.  EXT: gross edema. no cyanosis, clubbing  NEURO: CN II-XII intact, no focal deficits  PSYCH: nl affect, nl speech MSK: nl ROM, no erythema    Labs:  CBC Latest Ref Rng 03/05/2015 02/07/2010  WBC 4.0 - 10.5 K/uL 7.9 8.5  Hemoglobin 13.0 - 17.0 g/dL 14.5 15.2  Hematocrit 39.0 - 52.0 % 44.2 44.6  Platelets 150 - 400 K/uL 346 255    CMP Latest Ref Rng 03/05/2015  Glucose 70 - 99 mg/dL 115(H)  BUN 6 - 23 mg/dL 8  Creatinine 0.50 - 1.35 mg/dL 1.07  Sodium 135 - 145 mmol/L 141  Potassium 3.5 - 5.1 mmol/L 3.9  Chloride 96 - 112 mmol/L 103  CO2 19 - 32 mmol/L 31  Calcium 8.4 - 10.5 mg/dL 8.6  Total Protein 6.0 - 8.3 g/dL 5.8(L)  Total Bilirubin 0.3 - 1.2 mg/dL 1.0  Alkaline Phos 39 - 117 U/L 46  AST 0 - 37 U/L 36  ALT 0 - 53 U/L 36   Lipid Panel     Component Value Date/Time  CHOL 166 03/06/2015 0155   TRIG 248* 03/06/2015 0155   HDL 42 03/06/2015 0155   CHOLHDL 4.0 03/06/2015 0155   VLDL 50* 03/06/2015 0155   LDLCALC 74 03/06/2015 0155   Hepatic Function Latest Ref Rng 03/05/2015  Total Protein 6.0 - 8.3 g/dL 5.8(L)  Albumin 3.5 - 5.2 g/dL 2.9(L)  AST 0 - 37 U/L 36  ALT 0 - 53 U/L 36  Alk Phosphatase 39 - 117 U/L 46  Total Bilirubin 0.3 - 1.2 mg/dL 1.0  Bilirubin, Direct 0.0 - 0.5 mg/dL 0.2    Lab Results  Component Value Date   TSH 0.568 03/05/2015    Lab Results  Component Value Date   TROPONINI 0.06* 03/06/2015   BNP (last 3 results)  Recent Labs  03/05/15 1155  BNP 338.9*   9:08am 03/06/2015 Protein/Creatinine Ratio: 0.66 (High) Total Protein, urine: 82  mg/dL Creatinine, urine: 125.17 mg/dL  Images: Second EKG - normal sinus rhythm, RBBB (not evident of tachycardia like the first EKG from ED admission).   Echo pending   Medications, Vitals, Labs, and Images reviewed.   ASSESSMENT AND PLAN: 34 y.o. male with PMH significant for HTN with 3-4 months of SOB and 1-2 weeks of gradually worsening gross edema found to have low serum albumin, a protein positive U/A, abnormal EKG with RBBB and LAD, chest x-ray evident for mild cardiomegaly and interstitial thickening, increased troponins and increased BNP being worked-up for possible nephrotic syndrome in the setting of uncontrolled HTN. Patient is also being worked up for possible interstitial disease/pulmonary hypertension in the setting of obesity, OSA and smoking possibly leading to right heart failure given his gross edema, SOB, abnormal lung findings on physical exam, cardiomegaly found on chest x-ray and RBBB evident on repeat EKGs.   Shortness of Breath: Likely secondary to fluid overload from nephrotic syndrome or cardiopulmonary disease.  - Trend troponins - Repeat EKG - Echocardiogram pending  - continue Duonebs 79m q6  - Supplemental O2 if needed - Diet: Heart Healthy Diet. Diet/Nutrition counseling needed. - Pending labs for A1c - Repeat Bmet - Telemetry  - 24hr Urine protein  - Protein/Creatinine ration  - Consider Pulmonary Function Test   Edema: Likely secondary to underlying nephrotic syndrome and/or cardiopulmonary disease.  - IV lasix 479mBID (patient put out 2.8L with 4030mV over 12hr yesterday).  - Monitor Ins/Outs - 24hr urine with protein/creatinine ratio  HTN: BP was spiking again last night but has since stabilized at 136/91. Monitor and consider prescription as an outpatient (hydrocholorthiazide + ACEi or ARB).  - IV Lasix 58m60mD  - Trend BP - Lifestyle/diet modification and counseling.   OSA: Per nursing report, patient dropping O2 sats on room air at  night (into the 60s).  - Sleep study, CPAP.  Obesity: Diet/lifestyle counseling. Exercise planning.   Diet: Heart Healthy   DVT PPX: Lovenox 58mg44mhr

## 2015-03-06 NOTE — Progress Notes (Signed)
Pt asleep.

## 2015-03-06 NOTE — Progress Notes (Signed)
Subjective: Roger Ward slept well despite using the CPAP for the first time. He feels like his abdominal swelling might be slightly decreased today, but he remains uncomfortable from his extra fluid.  Objective: Vital signs in last 24 hours: Filed Vitals:   03/06/15 0446 03/06/15 0851 03/06/15 1030 03/06/15 1045  BP: 140/98  139/93 136/91  Pulse: 97  94 95  Temp: 97.9 F (36.6 C)     TempSrc: Oral     Resp: 18     Height:      Weight:      SpO2: 97% 94%     Weight change:   Intake/Output Summary (Last 24 hours) at 03/06/15 1105 Last data filed at 03/06/15 0907  Gross per 24 hour  Intake    820 ml  Output   3700 ml  Net  -2880 ml   Physical Exam: Appearance: in NAD, asleep in bed with wife at bedside, at 45 degree angle in bed, CPAP in place HEENT: AT/[place], PERRL, EOMi, no periorbital edema, full neck and face Heart: distant heart sounds, RRR, normal S1S2 Lungs: distant lung sounds, diffuse rhonchi and expiratory wheezes, slightly increased effort Abdomen: BS+, soft, nontender, ascites evident with shifting dullness, extensive striae across abdomen that patient says are new Musculoskeletal: normal range of motion Extremities: 2+ pitting edema to knees Neurologic: A&Ox3, grossly intact Skin: no rashes or lesions  Lab Results: Basic Metabolic Panel:  Recent Labs Lab 03/05/15 1155  NA 141  K 3.9  CL 103  CO2 31  GLUCOSE 115*  BUN 8  CREATININE 1.07  CALCIUM 8.6   Liver Function Tests:  Recent Labs Lab 03/05/15 1943  AST 36  ALT 36  ALKPHOS 46  BILITOT 1.0  PROT 5.8*  ALBUMIN 2.9*   CBC:  Recent Labs Lab 03/05/15 1155  WBC 7.9  HGB 14.5  HCT 44.2  MCV 80.2  PLT 346   Cardiac Enzymes:  Recent Labs Lab 03/05/15 1943 03/06/15 0155  TROPONINI 0.07* 0.06*   CBG:  Recent Labs Lab 03/05/15 1658  GLUCAP 103*   Fasting Lipid Panel:  Recent Labs Lab 03/06/15 0155  CHOL 166  HDL 42  LDLCALC 74  TRIG 248*  CHOLHDL 4.0   Thyroid  Function Tests:  Recent Labs Lab 03/05/15 1943  TSH 0.568   Urinalysis:  Recent Labs Lab 03/05/15 1155  COLORURINE YELLOW  LABSPEC 1.012  PHURINE 5.5  GLUCOSEU NEGATIVE  HGBUR TRACE*  BILIRUBINUR NEGATIVE  KETONESUR NEGATIVE  PROTEINUR 100*  UROBILINOGEN 1.0  NITRITE NEGATIVE  LEUKOCYTESUR NEGATIVE   Studies/Results: Dg Chest 2 View  03/05/2015   CLINICAL DATA:  Pt states he has had a non-productive cough and SOB x 85months. He also claims swelling of his face, ankles and abdomen x 1week. No prior injury or surgery to his chest. Current smoker: 1/2 ppd  EXAM: CHEST  2 VIEW  COMPARISON:  02/07/2010  FINDINGS: Cardiac silhouette is borderline enlarged and larger than it was on the prior study. There is central vascular congestion and bilateral interstitial thickening, also new. No mediastinal or hilar masses. No focal areas of lung consolidation. No pleural effusion or pneumothorax.  Bony thorax is intact.  IMPRESSION: 1. Borderline enlargement of the cardiopericardial silhouette and mild bilateral interstitial thickening. Findings may reflect mild interstitial edema. Interstitial inflammation or infection is possible.   Electronically Signed   By: Amie Portland M.D.   On: 03/05/2015 12:04   Medications: I have reviewed the patient's current medications. Scheduled Meds: .  enoxaparin (LOVENOX) injection  80 mg Subcutaneous Q24H  . ipratropium-albuterol  3 mL Nebulization Q6H   Continuous Infusions:  PRN Meds:.perflutren lipid microspheres (DEFINITY) IV suspension Assessment/Plan: Principal Problem:   Dyspnea Active Problems:   Edema  Roger Ward is a 34 yo man with no medical history who is here with hypertension and new dyspnea / ascites / full body edema. The cause of these symptoms is likely multifactorial; he has been found to have interstitial thickening on chest xray, obstructive sleep apnea, proteinuria on UA and an elevated BNP with signs of right heart strain on EKG.    Combined Heart Failure: The patient had no known history of heart failure, but his symptoms and EKG were worrisome for hypertensive cardiomyopathy. He admits to a poor diet and a family history of CHF (mother). His echo on this admission showed an LVEF 40% with diffuse hypokinesis, high ventricular filling pressure and signs of elevated central venous pressure. Lipids (cholesterol 166, triglycerides 248, HDL 42, LDL 74). TSH WNL. Troponins 0.07-->0.06 (likely due to CHF exacerbation). 50 lbs above baseline (per patient). Down 4 pounds and 2.8 L. - Continue lasix 40 mg IV BID - Will consider cardiology consult - Ins & Outs - Telemetry - HgbA1c pending  Proteinuria: Patient had 100 protein on UA. His physical exam supports nephrotic syndrome. Dipstick protein 82, creatinine 125.17, ratio 0.66 (H). This is likely suggestive of nephrotic syndrome, but 24 hour test to confirm if >3.5 g. - 24 hour total protein and creatinine clearance pending  Mild Bilateral Interstitial Thickening of Lungs on CXR: Patient has a smoking history; no history of exposures. Wheezing and rhonchi on exam.  - Consider PFTs - Consider further chest imaging (CT)  - Duonebs PRN  Likely Obstructive Sleep Apnea: Observed to be gasping for breath with brief periods where he appeared to not breath by RN (dropped to 60% saturation). CPAP initiated on this hospitalization. If confirmed, this would be contributing to right heart failure / current presentation. Also likely component of obesity hypoventilation syndrome. - Continue CPAP at night - Sleep study to investigate as outpatient - O2 saturations at night  Hypertension: Patient has never taken medications for his hypertension, but has been told on prior ED visits that he was hypertensive. Currently 121/73, but previously 172/121. Getting IV lasix. - Trend BPs; consider starting medication for hypertension if returns to hypertension  Diet: Heart healthy  DVT Ppx:  Lovenox  Dispo: Disposition is deferred at this time, awaiting improvement of current medical problems.  Anticipated discharge in approximately 2-3 day(s).   The patient does not have a current PCP (No Pcp Per Patient) and does not know need an [facility] hospital follow-up appointment after discharge.  The patient does not have transportation limitations that hinder transportation to clinic appointments.  .Services Needed at time of discharge: Y = Yes, Blank = No PT:   OT:   RN:   Equipment:   Other:     LOS: 1 day   Stark Bray, MD 03/06/2015, 11:05 AM

## 2015-03-06 NOTE — Progress Notes (Signed)
HR in 30's, heart block 2nd degree type 2 nonsustained. Baseline HR is NSR 80's. BP is 162/89. Upon assessment of patient he is sleeping in bed and having periods of apnea. O2 saturation is 67-93% via RA. Patient denies CP or SOB. Patient's wife at bedside states, " He sleeps like this all the time at home." Respiratory at bedside administering breathing treatment. Dr. Valentino Saxon notified. Orders received. Will continue to monitor.

## 2015-03-07 DIAGNOSIS — I5041 Acute combined systolic (congestive) and diastolic (congestive) heart failure: Secondary | ICD-10-CM

## 2015-03-07 DIAGNOSIS — I441 Atrioventricular block, second degree: Secondary | ICD-10-CM

## 2015-03-07 DIAGNOSIS — I10 Essential (primary) hypertension: Secondary | ICD-10-CM

## 2015-03-07 DIAGNOSIS — R0600 Dyspnea, unspecified: Secondary | ICD-10-CM

## 2015-03-07 LAB — BASIC METABOLIC PANEL
ANION GAP: 7 (ref 5–15)
BUN: 8 mg/dL (ref 6–23)
CO2: 33 mmol/L — ABNORMAL HIGH (ref 19–32)
Calcium: 8.6 mg/dL (ref 8.4–10.5)
Chloride: 102 mmol/L (ref 96–112)
Creatinine, Ser: 1.06 mg/dL (ref 0.50–1.35)
GFR calc non Af Amer: >90 mL/min (ref >90)
Glucose, Bld: 97 mg/dL (ref 70–99)
POTASSIUM: 3.8 mmol/L (ref 3.5–5.1)
SODIUM: 142 mmol/L (ref 135–145)

## 2015-03-07 MED ORDER — IPRATROPIUM-ALBUTEROL 0.5-2.5 (3) MG/3ML IN SOLN: 3.0000 mL | Freq: Four times a day (QID) | RESPIRATORY_TRACT | Status: DC | PRN

## 2015-03-07 MED ORDER — FUROSEMIDE 40 MG PO TABS
40.0000 mg | ORAL_TABLET | Freq: Two times a day (BID) | ORAL | Status: DC
  Administered 2015-03-07 – 2015-03-08 (×2): 40 mg via ORAL
  Filled 2015-03-07 (×4): qty 1

## 2015-03-07 NOTE — Progress Notes (Signed)
Principal Problem:   Dyspnea Active Problems:   Edema      Code Status Orders        Start     Ordered   03/05/15 1844  Full code   Continuous     03/05/15 1843      Length of Stay (days):2   SUBJECTIVE/24 HOUR EVENTS: 34 y.o. male with PMH significant for HTN and probable OSA with 3-4 months of SOB and 1-2 weeks of gradually worsening gross edema found to have low serum albumin, a protein positive U/A, increased protein/creatinine ratio, abnormal EKG with old RBBB and LAD, chest x-ray evident for mild cardiomegaly and interstitial thickening, increased troponins, increased BNP and ejection fraction of 40% and diffuse hypokenesis in the setting of bilateral ventricular and atrial dilation found on echo. Patient's SOB and edema have slightly improved from yesterday - slept with CPAP last night and states he slept really well and didn't mind the mask.  Patient continues to deny chest pain/palpitations/dizziness/headache/nausea/vomiting/diarrhea/dysuria/fever/chills. Telemetry from last night showed runs of ventricular tachycardia with periods of second degree AV block, type 2 - team has agreed a cardiology consultation is necessary at this time given his abnormal heart findings in the setting of SOB, edema, and possible lung and/or kidney disease. Patient was initiated on 24 hr urine collection for possible nephrotic syndrome yesterday - collection has completed but results are still pending. PFT, protein electrophoresis, kappa/lambda light chain results still pending. Will review results to rule out possible amyloidosis as an etiology for his nephrotic syndrome and cardiac symptoms.   OBJECTIVE: Filed Vitals:   03/07/15 0500 03/07/15 0632 03/07/15 0814 03/07/15 1141  BP: 149/107 154/62 141/89 147/106  Pulse: 94 84 80 61  Temp: 98.2 F (36.8 C)  98.5 F (36.9 C) 97.9 F (36.6 C)  TempSrc: Oral  Oral Oral  Resp: '18 18 17 17  ' Height:      Weight:      SpO2: 95% 94% 99% 97%     Intake/Output Summary (Last 24 hours) at 03/07/15 1528 Last data filed at 03/07/15 1402  Gross per 24 hour  Intake    340 ml  Output   3425 ml  Net  -3085 ml    Intake/Output last 3 shifts: I/O last 3 completed shifts: In: 1520 [P.O.:1520] Out: 8150 [Urine:8150]  Allergies  Allergen Reactions  . Penicillins Swelling    Medications: Scheduled Meds: . enoxaparin (LOVENOX) injection  80 mg Subcutaneous Q24H  . furosemide  40 mg Oral BID   Continuous Infusions:  PRN Meds:.ipratropium-albuterol  Physical Exam: GEN: NAD, AAOx3, WDWN, obese  HEENT: MMM, EOMI, PERRLA, conjunctival injection NECK: no JVD, no carotid bruits CV: distant heart sounds, RRR, S1S2nl, no murmurs/rubs/gallops, no S3/S4 PULM: some end-expiratory and inspiratory wheezing bilaterally but significantly decreased from yesterday. Rhonchorous breathing decreased from yesterday. No crackles.  ABD: normal/active bowel sounds, distended, non-tender. No rebound, no guarding, unable to appreciate hepatosplenomegaly. Bilateral striae in the lower abdominal quadrants.  EXT: gross edema. no cyanosis, clubbing  NEURO: CN II-XII intact, no focal deficits.  PSYCH: nl affect, nl speech MSK: nl ROM, no erythema    Labs: Recent Labs     03/05/15  1155   03/06/15  2147  03/07/15  0639  HGB  14.5   --    --    --   HCT  44.2   --    --    --   PLT  346   --    --    --  NA  141   < >  142  142  K  3.9   < >  3.7  3.8  CL  103   < >  101  102  CO2  31   < >  38*  33*  BUN  8   < >  9  8  CREATININE  1.07   < >  1.22  1.06  CALCIUM  8.6   < >  8.7  8.6   < > = values in this interval not displayed.    CBC Latest Ref Rng 03/05/2015 02/07/2010  WBC 4.0 - 10.5 K/uL 7.9 8.5  Hemoglobin 13.0 - 17.0 g/dL 14.5 15.2  Hematocrit 39.0 - 52.0 % 44.2 44.6  Platelets 150 - 400 K/uL 346 255    BMP Latest Ref Rng 03/07/2015 03/06/2015 03/06/2015  Glucose 70 - 99 mg/dL 97 119(H) 113(H)  BUN 6 - 23 mg/dL '8 9 10   ' Creatinine 0.50 - 1.35 mg/dL 1.06 1.22 1.12  Sodium 135 - 145 mmol/L 142 142 142  Potassium 3.5 - 5.1 mmol/L 3.8 3.7 4.2  Chloride 96 - 112 mmol/L 102 101 102  CO2 19 - 32 mmol/L 33(H) 38(H) 34(H)  Calcium 8.4 - 10.5 mg/dL 8.6 8.7 8.6    CMP Latest Ref Rng 03/07/2015 03/06/2015 03/06/2015  Glucose 70 - 99 mg/dL 97 119(H) 113(H)  BUN 6 - 23 mg/dL '8 9 10  ' Creatinine 0.50 - 1.35 mg/dL 1.06 1.22 1.12  Sodium 135 - 145 mmol/L 142 142 142  Potassium 3.5 - 5.1 mmol/L 3.8 3.7 4.2  Chloride 96 - 112 mmol/L 102 101 102  CO2 19 - 32 mmol/L 33(H) 38(H) 34(H)  Calcium 8.4 - 10.5 mg/dL 8.6 8.7 8.6  Total Protein 6.0 - 8.3 g/dL - - -  Total Bilirubin 0.3 - 1.2 mg/dL - - -  Alkaline Phos 39 - 117 U/L - - -  AST 0 - 37 U/L - - -  ALT 0 - 53 U/L - - -   Lipid Panel     Component Value Date/Time   CHOL 166 03/06/2015 0155   TRIG 248* 03/06/2015 0155   HDL 42 03/06/2015 0155   CHOLHDL 4.0 03/06/2015 0155   VLDL 50* 03/06/2015 0155   LDLCALC 74 03/06/2015 0155   Hepatic Function Latest Ref Rng 03/05/2015  Total Protein 6.0 - 8.3 g/dL 5.8(L)  Albumin 3.5 - 5.2 g/dL 2.9(L)  AST 0 - 37 U/L 36  ALT 0 - 53 U/L 36  Alk Phosphatase 39 - 117 U/L 46  Total Bilirubin 0.3 - 1.2 mg/dL 1.0  Bilirubin, Direct 0.0 - 0.5 mg/dL 0.2   Lab Results  Component Value Date   TSH 0.568 03/05/2015   Lab Results  Component Value Date   TROPONINI 0.06* 03/06/2015   BNP (last 3 results)  Recent Labs  03/05/15 1155  BNP 338.9*   9:08am 03/06/2015 Protein/Creatinine Ratio: 0.66 (High) Total Protein, urine: 82 mg/dL Creatinine, urine: 125.17 mg/dL  Magnesium: 2.1 mg/dL (03/06/2015)  Lab Results  Component Value Date   HGBA1C 6.5* 03/05/2015    Images: 3rd EKG ordered today due to runs of VT and 2nd degree heart block on telemetry. Will review.   Medications, Vitals, Labs, and Images reviewed.   ASSESSMENT AND PLAN: 34 y.o. male with PMH significant for HTN and probably OSA with 3-4 months of SOB  and 1-2 weeks of gradually worsening gross edema found to have low serum albumin, a protein positive  U/A, abnormal EKG with old RBBB and LAD with VT and second degree heart block on telemetry, chest x-ray evident for mild cardiomegaly and interstitial thickening, echo showing EF of 40% with hypokinesis and diffuse dilatation, increased troponins and increased BNP being worked-up for possible nephrotic syndrome in the setting of uncontrolled HTN or amyloidosis. Patient is also being worked up for possible interstitial disease/pulmonary hypertension in the setting of obesity, OSA and smoking possibly leading to right heart failure given his gross edema, SOB, abnormal lung findings on physical exam, cardiomegaly found on chest x-ray and RBBB evident on repeat EKGs.   Shortness of Breath: Likely secondary to fluid overload from nephrotic syndrome or cardiopulmonary disease.  - Trend troponins - Repeat EKG - continue Duonebs 56m q6  - Supplemental O2 if needed - Diet: Heart Healthy Diet. Diet/Nutrition counseling needed. - Pending labs for protein electrophoresis, 24hr urine protein, kappa/lambda light chains.  - Repeat Bmet - Telemetry  - Pulmonary Function Test pending    Edema: Likely secondary to underlying nephrotic syndrome and/or cardiopulmonary disease.  - switch from IV lasix 472mBID to oral lasix 4041mID - Monitor Ins/Outs - 24hr urine with protein/creatinine ratio results pending   HTN: BP stabilizing in the 140s/100s. Monitor and consider prescription as an outpatient (hydrocholorthiazide + ACEi or ARB).  - oral Lasix 15m21mD  - Trend BP - Lifestyle/diet modification and counseling  OSA: Patient slept well on CPAP and maintained O2 sats in the 90s.  - Sleep study, CPAP.  Obesity: Diet/lifestyle counseling. Exercise planning.   Diabetes: Patient's A1c 6.5 in the setting of continuously high serum glucose on bmet (110-120), capillary glucose 103. This could also be  another contributor to possible kidney disease.  - Counsel the patient on lifestlye/diet/exercise modifications to control before starting metformin.   Diet: Heart Healthy   DVT PPX: Lovenox 15mg63mhr

## 2015-03-07 NOTE — Progress Notes (Signed)
Subjective: Roger Ward slept well again on the CPAP. He feels like his swelling is diffusely less and that he is breathing more easily. He has 2 episodes of cramping overnight (one in his right hand, one in his abdomen). The cramping resolved after 1 minute. He has noticed increased urination.  Objective: Vital signs in last 24 hours: Filed Vitals:   03/07/15 0446 03/07/15 0500 03/07/15 0632 03/07/15 0814  BP:  149/107 154/62 141/89  Pulse:  94 84 80  Temp:  98.2 F (36.8 C)  98.5 F (36.9 C)  TempSrc:  Oral  Oral  Resp:  Height:      Weight: 341 lb (154.677 kg)     SpO2:  95% 94% 99%   Weight change: -10 lb 3.2 oz (-4.627 kg)  Intake/Output Summary (Last 24 hours) at 03/07/15 0855 Last data filed at 03/07/15 0758  Gross per 24 hour  Intake    580 ml  Output   5075 ml  Net  -4495 ml   Physical Exam: Appearance: in NAD, asleep in bed with wife at bedside, at 25 degree angle in bed, CPAP in place, TV on HEENT: AT/[place], PERRL, EOMi, no periorbital edema, full neck and face Heart: distant heart sounds, RRR, normal S1S2 Lungs: distant lung sounds, faint diffuse rhonchi and expiratory wheezes, normal effort Abdomen: BS+, soft, nontender, ascites evident with shifting dullness, extensive striae across abdomen that patient says are new Musculoskeletal: normal range of motion Extremities: 1+ pitting edema to knees, appear full Neurologic: A&Ox3, grossly intact Skin: no rashes or lesions  Lab Results: Basic Metabolic Panel:  Recent Labs Lab 03/06/15 2147 03/07/15 0639  NA 142 142  K 3.7 3.8  CL 101 102  CO2 38* 33*  GLUCOSE 119* 97  BUN 9 8  CREATININE 1.22 1.06  CALCIUM 8.7 8.6  MG 2.1  --    Liver Function Tests:  Recent Labs Lab 03/05/15 1943  AST 36  ALT 36  ALKPHOS 46  BILITOT 1.0  PROT 5.8*  ALBUMIN 2.9*   CBC:  Recent Labs Lab 03/05/15 1155  WBC 7.9  HGB 14.5  HCT 44.2  MCV 80.2  PLT 346   Cardiac Enzymes:  Recent Labs Lab  03/05/15 1943 03/06/15 0155 03/06/15 0843  TROPONINI 0.07* 0.06* 0.06*   CBG:  Recent Labs Lab 03/05/15 1658  GLUCAP 103*   Fasting Lipid Panel:  Recent Labs Lab 03/06/15 0155  CHOL 166  HDL 42  LDLCALC 74  TRIG 248*  CHOLHDL 4.0   Thyroid Function Tests:  Recent Labs Lab 03/05/15 1943  TSH 0.568   Urinalysis:  Recent Labs Lab 03/05/15 1155  COLORURINE YELLOW  LABSPEC 1.012  PHURINE 5.5  GLUCOSEU NEGATIVE  HGBUR TRACE*  BILIRUBINUR NEGATIVE  KETONESUR NEGATIVE  PROTEINUR 100*  UROBILINOGEN 1.0  NITRITE NEGATIVE  LEUKOCYTESUR NEGATIVE   Studies/Results: Dg Chest 2 View  03/05/2015   CLINICAL DATA:  Pt states he has had a non-productive cough and SOB x 4months. He also claims swelling of his face, ankles and abdomen x 1week. No prior injury or surgery to his chest. Current smoker: 1/2 ppd  EXAM: CHEST  2 VIEW  COMPARISON:  02/07/2010  FINDINGS: Cardiac silhouette is borderline enlarged and larger than it was on the prior study. There is central vascular congestion and bilateral interstitial thickening, also new. No mediastinal or hilar masses. No focal areas of lung consolidation. No pleural effusion or pneumothorax.  Bony thorax is intact.  IMPRESSION: 1. Borderline enlargement of the cardiopericardial silhouette and mild bilateral interstitial thickening. Findings may reflect mild interstitial edema. Interstitial inflammation or infection is possible.   Electronically Signed   By: Amie Portland M.D.   On: 03/05/2015 12:04   Medications: I have reviewed the patient's current medications. Scheduled Meds: . enoxaparin (LOVENOX) injection  80 mg Subcutaneous Q24H  . furosemide  40 mg Intravenous BID   Continuous Infusions:  PRN Meds:.ipratropium-albuterol Assessment/Plan: Principal Problem:   Dyspnea Active Problems:   Edema  Roger Ward is a 34 yo man with no medical history who is here with hypertension and new dyspnea / ascites / full body edema. The  cause of these symptoms is likely multifactorial; he has been found to have interstitial thickening on chest xray, obstructive sleep apnea, proteinuria on UA and an elevated BNP with signs of right heart strain on EKG. Patient with new findings on telemetry today (ventricular tachycardia 10 beats, 2nd degree AV block Mobitz type II x2).  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 He admits to a poor diet and a family history of CHF (mother). His echo on this admission showed an LVEF 40% with diffuse hypokinesis, high ventricular filling pressure, pulmonary artery peak pressure 45 and signs of elevated central venous pressure. Lipids (cholesterol 166, triglycerides 248, HDL 42, LDL 74). TSH WNL. Troponins 0.07-->0.06 (likely due to CHF exacerbation). Admission weight was 50 lbs above baseline (per patient). Down 10 pounds and 6.6 L. 10 beats of ventricular tachycardia and two occurrences of brief second degree AV block, Mobitz type II on telemetry today. Concern for cardiomyopathy, considering cardiac amyloidosis, cardiac sarcoidosis. - Switch lasix from IV to PO 40 mg BID - Appreciate cardiology consult  - Ins & Outs - Telemetry - HgbA1c pending - Investigate telemetry - Repeat EKG  Proteinuria: Patient had 100 protein on UA. His physical exam supports nephrotic syndrome. Dipstick protein 82, creatinine 125.17, ratio 0.66 (H). This is likely suggestive of nephrotic syndrome, but 24 hour test to confirm if >3.5 g. - 24 hour total protein and creatinine clearance in process - Protein electrophoresis and kappa/lambda light chains pending  Mild Bilateral Interstitial Thickening of Lungs on CXR: Patient has a smoking history; no history of exposures. Wheezing and rhonchi on exam.  - Consider PFTs - Consider further chest imaging (CT)  - Duonebs PRN  Likely Obstructive Sleep Apnea: Observed to be gasping for  breath with brief periods where he appeared to not breath by RN (dropped to 60% saturation). CPAP initiated on this hospitalization without any problems. If confirmed, this would be contributing to right heart failure / current presentation. Also likely component of obesity hypoventilation syndrome.  - Continue CPAP at night - Sleep study to investigate as outpatient - O2 saturations at night  Hypertension: Patient has never taken medications for his hypertension, but has been told on prior ED visits that he was hypertensive. Currently 141/106, previously up to 172/121. Getting IV lasix. - Trend BPs; consider starting medication for hypertension if returns to hypertension  Diet: Heart healthy  DVT Ppx: Lovenox  Dispo: Disposition is deferred at this time, awaiting improvement of current medical problems.  Anticipated discharge in approximately 1-2 day(s).   The patient does not have a current PCP (No Pcp Per Patient) and does not know need an [facility] hospital follow-up appointment after discharge.  The patient does not have transportation limitations that hinder transportation  to clinic appointments.  .Services Needed at time of discharge: Y = Yes, Blank = No PT:   OT:   RN:   Equipment:   Other:     LOS: 2 days   Stark Bray, MD 03/07/2015, 8:55 AM

## 2015-03-07 NOTE — Progress Notes (Signed)
Pt states that he places self on and off the CPAP at bedtime. He is wearing a Large FFM with 7 CPAP pressure. And 2 LPM O2 bled in. RT made pt aware that if he needed any help to please call.

## 2015-03-07 NOTE — Progress Notes (Signed)
Telemetry shows 10 beat run of VT. Patient is asleep in bed. Easily aroused. Denies CP or SOB. Vs - 84-18-154/62. O2 saturation 94% on CPAP. No distress noted. MD notified. Will continue to monitor.

## 2015-03-07 NOTE — Consult Note (Signed)
Reason for Consult: NSVT, SOB Referring Physician:   PER BEAGLEY is an 34 y.o. male.  HPI:   The patient is a 34 yo obese male with a history of HTN, tobacco abuse.  He has never been prescribe meds for blood pressure.  No family history of MI or PPM.  He reports SOB and edema in his legs for a week.  Cough for a month.  + orthopnea and DOE  Past Medical History  Diagnosis Date  . Hypertension   . Family history of adverse reaction to anesthesia     mother and aunt  both put to sleep for surgery and "stopped breathing"    History reviewed. No pertinent past surgical history.  Family History  Problem Relation Age of Onset  . Hypertension Mother   . Hyperlipidemia Mother   . Heart failure Mother   . Multiple sclerosis Mother   . Breast cancer Maternal Grandmother   . Other Maternal Uncle     Social History:  reports that he has been smoking Cigarettes.  He started smoking about 19 years ago. He has a 4.75 pack-year smoking history. He has never used smokeless tobacco. He reports that he drinks about 0.6 - 1.2 oz of alcohol per week. He reports that he does not use illicit drugs.  Allergies:  Allergies  Allergen Reactions  . Penicillins Swelling    Medications:  Scheduled Meds: . enoxaparin (LOVENOX) injection  80 mg Subcutaneous Q24H  . furosemide  40 mg Oral BID   Continuous Infusions:  PRN Meds:.ipratropium-albuterol   Results for orders placed or performed during the hospital encounter of 03/05/15 (from the past 48 hour(s))  Hemoglobin A1c     Status: Abnormal   Collection Time: 03/05/15  7:43 PM  Result Value Ref Range   Hgb A1c MFr Bld 6.5 (H) 4.8 - 5.6 %    Comment: (NOTE)         Pre-diabetes: 5.7 - 6.4         Diabetes: >6.4         Glycemic control for adults with diabetes: <7.0    Mean Plasma Glucose 140 mg/dL    Comment: (NOTE) Performed At: [facility] [place], [place] [id number] Lindon Romp MD FH:[id number]    Hepatic function panel     Status: Abnormal   Collection Time: 03/05/15  7:43 PM  Result Value Ref Range   Total Protein 5.8 (L) 6.0 - 8.3 g/dL   Albumin 2.9 (L) 3.5 - 5.2 g/dL   AST 36 0 - 37 U/L   ALT 36 0 - 53 U/L   Alkaline Phosphatase 46 39 - 117 U/L   Total Bilirubin 1.0 0.3 - 1.2 mg/dL   Bilirubin, Direct 0.2 0.0 - 0.5 mg/dL   Indirect Bilirubin 0.8 0.3 - 0.9 mg/dL  TSH     Status: None   Collection Time: 03/05/15  7:43 PM  Result Value Ref Range   TSH 0.568 0.350 - 4.500 uIU/mL  Troponin I (q 6hr x 3)     Status: Abnormal   Collection Time: 03/05/15  7:43 PM  Result Value Ref Range   Troponin I 0.07 (H) <0.031 ng/mL    Comment:        PERSISTENTLY INCREASED TROPONIN VALUES IN THE RANGE OF 0.04-0.49 ng/mL CAN BE SEEN IN:       -UNSTABLE ANGINA       -CONGESTIVE HEART FAILURE       -  MYOCARDITIS       -CHEST TRAUMA       -ARRYHTHMIAS       -LATE PRESENTING MYOCARDIAL INFARCTION       -COPD   CLINICAL FOLLOW-UP RECOMMENDED.   Lipid panel     Status: Abnormal   Collection Time: 03/06/15  1:55 AM  Result Value Ref Range   Cholesterol 166 0 - 200 mg/dL   Triglycerides 248 (H) <150 mg/dL   HDL 42 >39 mg/dL   Total CHOL/HDL Ratio 4.0 RATIO   VLDL 50 (H) 0 - 40 mg/dL   LDL Cholesterol 74 0 - 99 mg/dL    Comment:        Total Cholesterol/HDL:CHD Risk Coronary Heart Disease Risk Table                     Men   Women  1/2 Average Risk   3.4   3.3  Average Risk       5.0   4.4  2 X Average Risk   9.6   7.1  3 X Average Risk  23.4   11.0        Use the calculated Patient Ratio above and the CHD Risk Table to determine the patient's CHD Risk.        ATP III CLASSIFICATION (LDL):  <100     mg/dL   Optimal  100-129  mg/dL   Near or Above                    Optimal  130-159  mg/dL   Borderline  160-189  mg/dL   High  >190     mg/dL   Very High   Troponin I (q 6hr x 3)     Status: Abnormal   Collection Time: 03/06/15  1:55 AM  Result Value Ref Range   Troponin I  0.06 (H) <0.031 ng/mL    Comment:        PERSISTENTLY INCREASED TROPONIN VALUES IN THE RANGE OF 0.04-0.49 ng/mL CAN BE SEEN IN:       -UNSTABLE ANGINA       -CONGESTIVE HEART FAILURE       -MYOCARDITIS       -CHEST TRAUMA       -ARRYHTHMIAS       -LATE PRESENTING MYOCARDIAL INFARCTION       -COPD   CLINICAL FOLLOW-UP RECOMMENDED.   Basic metabolic panel     Status: Abnormal   Collection Time: 03/06/15  8:43 AM  Result Value Ref Range   Sodium 142 135 - 145 mmol/L   Potassium 4.2 3.5 - 5.1 mmol/L   Chloride 102 96 - 112 mmol/L   CO2 34 (H) 19 - 32 mmol/L   Glucose, Bld 113 (H) 70 - 99 mg/dL   BUN 10 6 - 23 mg/dL   Creatinine, Ser 1.12 0.50 - 1.35 mg/dL   Calcium 8.6 8.4 - 10.5 mg/dL   GFR calc non Af Amer 85 (L) >90 mL/min   GFR calc Af Amer >90 >90 mL/min    Comment: (NOTE) The eGFR has been calculated using the CKD EPI equation. This calculation has not been validated in all clinical situations. eGFR's persistently <90 mL/min signify possible Chronic Kidney Disease.    Anion gap 6 5 - 15  Troponin I (q 6hr x 3)     Status: Abnormal   Collection Time: 03/06/15  8:43 AM  Result Value Ref Range   Troponin I  0.06 (H) <0.031 ng/mL    Comment:        PERSISTENTLY INCREASED TROPONIN VALUES IN THE RANGE OF 0.04-0.49 ng/mL CAN BE SEEN IN:       -UNSTABLE ANGINA       -CONGESTIVE HEART FAILURE       -MYOCARDITIS       -CHEST TRAUMA       -ARRYHTHMIAS       -LATE PRESENTING MYOCARDIAL INFARCTION       -COPD   CLINICAL FOLLOW-UP RECOMMENDED.   Protein / creatinine ratio, urine     Status: Abnormal   Collection Time: 03/06/15  9:08 AM  Result Value Ref Range   Creatinine, Urine 125.17 mg/dL   Total Protein, Urine 82 mg/dL    Comment: NO NORMAL RANGE ESTABLISHED FOR THIS TEST   Protein Creatinine Ratio 0.66 (H) 0.00 - 4.58  Basic metabolic panel     Status: Abnormal   Collection Time: 03/06/15  9:47 PM  Result Value Ref Range   Sodium 142 135 - 145 mmol/L   Potassium  3.7 3.5 - 5.1 mmol/L   Chloride 101 96 - 112 mmol/L   CO2 38 (H) 19 - 32 mmol/L   Glucose, Bld 119 (H) 70 - 99 mg/dL   BUN 9 6 - 23 mg/dL   Creatinine, Ser 1.22 0.50 - 1.35 mg/dL   Calcium 8.7 8.4 - 10.5 mg/dL   GFR calc non Af Amer 77 (L) >90 mL/min   GFR calc Af Amer 89 (L) >90 mL/min    Comment: (NOTE) The eGFR has been calculated using the CKD EPI equation. This calculation has not been validated in all clinical situations. eGFR's persistently <90 mL/min signify possible Chronic Kidney Disease.    Anion gap 3 (L) 5 - 15  Magnesium     Status: None   Collection Time: 03/06/15  9:47 PM  Result Value Ref Range   Magnesium 2.1 1.5 - 2.5 mg/dL  Basic metabolic panel     Status: Abnormal   Collection Time: 03/07/15  6:39 AM  Result Value Ref Range   Sodium 142 135 - 145 mmol/L   Potassium 3.8 3.5 - 5.1 mmol/L   Chloride 102 96 - 112 mmol/L   CO2 33 (H) 19 - 32 mmol/L   Glucose, Bld 97 70 - 99 mg/dL   BUN 8 6 - 23 mg/dL   Creatinine, Ser 1.06 0.50 - 1.35 mg/dL   Calcium 8.6 8.4 - 10.5 mg/dL   GFR calc non Af Amer >90 >90 mL/min   GFR calc Af Amer >90 >90 mL/min    Comment: (NOTE) The eGFR has been calculated using the CKD EPI equation. This calculation has not been validated in all clinical situations. eGFR's persistently <90 mL/min signify possible Chronic Kidney Disease.    Anion gap 7 5 - 15   Echo Study Conclusions  - Left ventricle: The cavity size was mildly to moderately dilated. Wall thickness was increased in a pattern of mild LVH. The estimated ejection fraction was 40%. Diffuse hypokinesis. Doppler parameters are consistent with high ventricular filling pressure. - Mitral valve: There was moderate regurgitation. - Left atrium: The atrium was mildly dilated. - Right ventricle: The cavity size was mildly dilated. Systolic function was mildly reduced. - Right atrium: The atrium was mildly dilated. - Pulmonary arteries: PA peak pressure: 45 mm Hg  (S).  No results found.  Review of Systems  Constitutional: Negative for fever and diaphoresis.  HENT: Positive for congestion.  Respiratory: Positive for cough and shortness of breath.   Cardiovascular: Positive for orthopnea and leg swelling. Negative for chest pain and claudication.  Gastrointestinal: Negative for nausea, vomiting, abdominal pain, blood in stool and melena.  Genitourinary: Negative for flank pain.  Neurological: Negative for dizziness.  All other systems reviewed and are negative.  Blood pressure 147/106, pulse 61, temperature 97.9 F (36.6 C), temperature source Oral, resp. rate 17, height '5\' 11"'  (1.803 m), weight 341 lb (154.677 kg), SpO2 97 %. Physical Exam  Nursing note and vitals reviewed. Constitutional: He is oriented to person, place, and time. He appears well-developed. No distress.  Obese  HENT:  Head: Normocephalic and atraumatic.  Eyes: EOM are normal. Pupils are equal, round, and reactive to light. No scleral icterus.  Neck: Normal range of motion. Neck supple.  Cardiovascular: Normal rate, regular rhythm, S1 normal and S2 normal.  Exam reveals gallop.   No murmur heard. Pulses:      Radial pulses are 2+ on the right side, and 2+ on the left side.       Dorsalis pedis pulses are 2+ on the right side, and 2+ on the left side.  Respiratory: Effort normal. He has no wheezes.  + rhonchi   GI: Soft. Bowel sounds are normal. He exhibits no distension. There is no tenderness.  Musculoskeletal: He exhibits edema.  Lymphadenopathy:    He has no cervical adenopathy.  Neurological: He is alert and oriented to person, place, and time. He exhibits normal muscle tone.  Skin: Skin is warm and dry.  Psychiatric: He has a normal mood and affect.   Lipid Panel     Component Value Date/Time   CHOL 166 03/06/2015 0155   TRIG 248* 03/06/2015 0155   HDL 42 03/06/2015 0155   CHOLHDL 4.0 03/06/2015 0155   VLDL 50* 03/06/2015 0155   LDLCALC 74 03/06/2015 0155      Assessment/Plan: Principal Problem:   Dyspnea Active Problems:   Edema   Mitral regurgitation, moderate   Cardiomyopathy, EF 40%   NSVT   Second degree AVB type II   RBBB    HLD     DM2   Tobacco abuse   34 yo obese male with a history of HTN, tobacco abuse.  He has never been prescribe meds for blood pressure.  No family history of MI or PPM.  He reports SOB and edema in his legs for a week.  Cough for a month.  + orthopnea and DOE.  He is having short runs of NSVT and second degree AVB type 2 in the setting of volume overload with a new cardiomyopathy with EF of 40%.  Diffuse hypokinesis with moderate MR.  LV mild to moderate dilation, RV mild dilation. PA pressure 68mMg.  He is not on any AV nodal agents and should not be.  He is diuresing very well on IV lasix 40 mg BID. This was just changed to PO.  I think he still has fluid to lose.  Net fluids:  -1.5L/-7.9L as of now.   Could be related to untreated HTN.    Troponin was elevated to 0.07.  Probably from volume overload, NSVT(I only saw a few short runs 7-10 beats).  He should have an ischemic eval.    He likely has OSA and is sleeping with a CPAP while here.  TSH WNL. Protein/Creatinine elevated.  Pending labs:  Urine prot electro, 24hr urine protein, 24 hr creatine clear, kappa/lambda light chains.    HAGER,  BRYAN, PA-C 03/07/2015, 5:01 PM    I have seen and examined the patient along with HAGER, BRYAN, PA-C.  I have reviewed the chart, notes and new data.  I agree with PA/NP's note.  Key new complaints: feeling better Key examination changes: morbid obese, crowded oropharynx worrisome for OSA, no edema, cannot see JVP or locate apical impulse Key new findings / data: echo reviewed, LVEF 40% with global hypokinesis, mild LVH, suspect secondary (functional) MR  PLAN: Malignant HTN with secondary severe cardiomyopathy and systolic+diastolic CHF  Needs outpatient sleep study. Low level of suspicion for CAD, but  outpatient nuclear stress test is reasonable. Workup for secondary causes of HTN due to primary renal disease has been initiated. Renal artery stenosis will be hard to evaluate with [place] due to habitus. Discussed dietary sodium restriction. Long term, weight loss would be beneficial. Aggressive BP control, preferential use of ACEi/ARB, diuretic, hydralazine + nitrates. Beta blockers are to be used cautiously in view of 6AM second degree AV block (which is most likely due to OSA, though)   Sanda Klein, MD, [facility] [place] [phone] 03/07/2015, 6:12 PM

## 2015-03-07 NOTE — Progress Notes (Addendum)
Patient had 3 short runs of 2nd degree heart block and then one short run of VT. Patient asymptomatic with stable vitals. MD notified.  Will continue to monitor.

## 2015-03-08 DIAGNOSIS — I5041 Acute combined systolic (congestive) and diastolic (congestive) heart failure: Secondary | ICD-10-CM

## 2015-03-08 DIAGNOSIS — R800 Isolated proteinuria: Secondary | ICD-10-CM

## 2015-03-08 DIAGNOSIS — I429 Cardiomyopathy, unspecified: Secondary | ICD-10-CM

## 2015-03-08 DIAGNOSIS — G4733 Obstructive sleep apnea (adult) (pediatric): Secondary | ICD-10-CM

## 2015-03-08 DIAGNOSIS — R7309 Other abnormal glucose: Secondary | ICD-10-CM

## 2015-03-08 DIAGNOSIS — I10 Essential (primary) hypertension: Secondary | ICD-10-CM

## 2015-03-08 LAB — HEMOGLOBIN A1C
HEMOGLOBIN A1C: 6.5 % — AB (ref 4.8–5.6)
MEAN PLASMA GLUCOSE: 140 mg/dL

## 2015-03-08 LAB — BASIC METABOLIC PANEL
ANION GAP: 7 (ref 5–15)
BUN: 9 mg/dL (ref 6–23)
CO2: 33 mmol/L — ABNORMAL HIGH (ref 19–32)
CREATININE: 1.03 mg/dL (ref 0.50–1.35)
Calcium: 8.5 mg/dL (ref 8.4–10.5)
Chloride: 100 mmol/L (ref 96–112)
GFR calc Af Amer: >90 mL/min (ref >90)
Glucose, Bld: 114 mg/dL — ABNORMAL HIGH (ref 70–99)
POTASSIUM: 4 mmol/L (ref 3.5–5.1)
Sodium: 140 mmol/L (ref 135–145)

## 2015-03-08 LAB — PROTEIN, URINE, 24 HOUR
Collection Interval-UPROT: 24 hours
PROTEIN 24H UR: 1400 mg/d — AB (ref <150)
Protein, Urine: 28 mg/dL — ABNORMAL HIGH (ref 5–25)
URINE TOTAL VOLUME-UPROT: 5000 mL

## 2015-03-08 LAB — KAPPA/LAMBDA LIGHT CHAINS
KAPPA, LAMDA LIGHT CHAIN RATIO: 1.31 (ref 0.26–1.65)
Kappa free light chain: 31.29 mg/L — ABNORMAL HIGH (ref 3.30–19.40)
Lambda free light chains: 23.92 mg/L (ref 5.71–26.30)

## 2015-03-08 MED ORDER — LISINOPRIL 5 MG PO TABS
5.0000 mg | ORAL_TABLET | Freq: Every day | ORAL | Status: DC
  Administered 2015-03-08: 5 mg via ORAL
  Filled 2015-03-08: qty 1

## 2015-03-08 MED ORDER — ISOSORB DINITRATE-HYDRALAZINE 20-37.5 MG PO TABS: 1.0000 | ORAL_TABLET | Freq: Three times a day (TID) | ORAL | Status: DC

## 2015-03-08 MED ORDER — ISOSORBIDE MONONITRATE 15 MG HALF TABLET
15.0000 mg | ORAL_TABLET | Freq: Every day | ORAL | Status: DC
  Administered 2015-03-08: 15 mg via ORAL
  Filled 2015-03-08 (×2): qty 1

## 2015-03-08 MED ORDER — LISINOPRIL 5 MG PO TABS: 5.0000 mg | ORAL_TABLET | Freq: Every day | ORAL | Status: DC

## 2015-03-08 MED ORDER — FUROSEMIDE 40 MG PO TABS
40.0000 mg | ORAL_TABLET | Freq: Two times a day (BID) | ORAL | Status: DC
Start: 2015-03-08 — End: 2015-04-22

## 2015-03-08 MED ORDER — HYDRALAZINE HCL 25 MG PO TABS
25.0000 mg | ORAL_TABLET | Freq: Three times a day (TID) | ORAL | Status: DC
Start: 2015-03-08 — End: 2015-03-08
  Administered 2015-03-08: 25 mg via ORAL
  Filled 2015-03-08 (×4): qty 1

## 2015-03-08 NOTE — Progress Notes (Signed)
Pt discharge instructions and work note given, pt verbalized understanding.  VSS. Denies pain. Pt left floor ambulating accompanied by staff and family.

## 2015-03-08 NOTE — Progress Notes (Signed)
Patient Name: Roger Ward Date of Encounter: 03/08/2015  Primary Cardiologist: New   Principal Problem:   Dyspnea Active Problems:   Edema    SUBJECTIVE  Denies any CP or SOB. Feeling well.  CURRENT MEDS . enoxaparin (LOVENOX) injection  80 mg Subcutaneous Q24H  . furosemide  40 mg Oral BID  . lisinopril  5 mg Oral Daily    OBJECTIVE  Filed Vitals:   03/08/15 0036 03/08/15 0129 03/08/15 0436 03/08/15 0820  BP:   153/96 160/102  Pulse: 93  89 95  Temp:   98.2 F (36.8 C) 98 F (36.7 C)  TempSrc:   Oral Oral  Resp: 24  16 18   Height:      Weight:      SpO2: 97% 97% 97% 99%    Intake/Output Summary (Last 24 hours) at 03/08/15 1247 Last data filed at 03/08/15 0905  Gross per 24 hour  Intake   1164 ml  Output   1566 ml  Net   -402 ml   Filed Weights   03/05/15 2028 03/07/15 0446 03/07/15 2051  Weight: 347 lb 3.6 oz (157.5 kg) 341 lb (154.677 kg) 340 lb 14.4 oz (154.631 kg)    PHYSICAL EXAM  General: Pleasant, NAD. Neuro: Alert and oriented X 3. Moves all extremities spontaneously. Psych: Normal affect. HEENT:  Normal  Neck: Supple without bruits or JVD. Lungs:  Resp regular and unlabored. Decreased breath on R base Heart: RRR no s3, s4, or murmurs. Abdomen: Soft, non-tender, non-distended, BS + x 4.  Extremities: No clubbing, cyanosis or edema. DP/PT/Radials 2+ and equal bilaterally.  Accessory Clinical Findings  CBC No results for input(s): WBC, NEUTROABS, HGB, HCT, MCV, PLT in the last 72 hours. Basic Metabolic Panel  Recent Labs  03/06/15 2147 03/07/15 0639 03/08/15 0455  NA 142 142 140  K 3.7 3.8 4.0  CL 101 102 100  CO2 38* 33* 33*  GLUCOSE 119* 97 114*  BUN 9 8 9   CREATININE 1.22 1.06 1.03  CALCIUM 8.7 8.6 8.5  MG 2.1  --   --    Liver Function Tests  Recent Labs  03/05/15 1943  AST 36  ALT 36  ALKPHOS 46  BILITOT 1.0  PROT 5.8*  ALBUMIN 2.9*   Cardiac Enzymes  Recent Labs  03/05/15 1943 03/06/15 0155  03/06/15 0843  TROPONINI 0.07* 0.06* 0.06*   Hemoglobin A1C  Recent Labs  03/05/15 1943  HGBA1C 6.5*   Fasting Lipid Panel  Recent Labs  03/06/15 0155  CHOL 166  HDL 42  LDLCALC 74  TRIG 248*  CHOLHDL 4.0   Thyroid Function Tests  Recent Labs  03/05/15 1943  TSH 0.568    TELE NSR with single burst of NSVT and episodic AV block this morning    ECG  No new EKG  Echocardiogram 03/06/2015  LV EF: 40%  ------------------------------------------------------------------- Indications:   Dyspnea 786.09.  ------------------------------------------------------------------- History:  Risk factors: Current tobacco use. Morbidly obese.  ------------------------------------------------------------------- Study Conclusions  - Left ventricle: The cavity size was mildly to moderately dilated. Wall thickness was increased in a pattern of mild LVH. The estimated ejection fraction was 40%. Diffuse hypokinesis. Doppler parameters are consistent with high ventricular filling pressure. - Mitral valve: There was moderate regurgitation. - Left atrium: The atrium was mildly dilated. - Right ventricle: The cavity size was mildly dilated. Systolic function was mildly reduced. - Right atrium: The atrium was mildly dilated. - Pulmonary arteries: PA peak pressure: 45 mm Hg (  S).    Radiology/Studies  Dg Chest 2 View  03/05/2015   CLINICAL DATA:  Pt states he has had a non-productive cough and SOB x 4months. He also claims swelling of his face, ankles and abdomen x 1week. No prior injury or surgery to his chest. Current smoker: 1/2 ppd  EXAM: CHEST  2 VIEW  COMPARISON:  02/07/2010  FINDINGS: Cardiac silhouette is borderline enlarged and larger than it was on the prior study. There is central vascular congestion and bilateral interstitial thickening, also new. No mediastinal or hilar masses. No focal areas of lung consolidation. No pleural effusion or pneumothorax.  Bony  thorax is intact.  IMPRESSION: 1. Borderline enlargement of the cardiopericardial silhouette and mild bilateral interstitial thickening. Findings may reflect mild interstitial edema. Interstitial inflammation or infection is possible.   Electronically Signed   By: Amie Portland M.D.   On: 03/05/2015 12:04    ASSESSMENT AND PLAN  34 yo male with PMH of HTN not on BP med and tobacco abuse came in with 1 wk onset of SOB and edema  1. Acute on chronic combined systolic and diastolic heart failure  - Echo 03/06/2015 EF 40%, diffuse hypokinesis, moderate MR, PA peak pressure  - switched to  BID of lasix. Will need BMET in 1 wk to assess renal function.   - Add hydralazine + low dose nitrate. Continue ACEI  2. Newly diagnosed cardiomoypathy with EF 40%  - will need outpatient treadmill myoview to r/o ischemic CAD (no lexiscan or adenosine given AV block)  3. Malignant HTN  4. NSVT and AVB type 2 in the setting of volume overload  - will avoid BB, continue to have AV block overnight. Will need sleep study and possibly outpatient Holter monitor before EP eval. No Lexiscan with AV block, will need treadmill stress test  5. Probable OSA: need outpt sleep study  6. RBBB  Signed, Azalee Course PA-C Pager: [phone]  I have seen and examined the patient along with Azalee Course PA-C.  I have reviewed the chart, notes and new data.  I agree with PA's note.  Key new complaints: feeling much better, walking without dyspnea Key examination changes: no rales or S3 Key new findings / data: proteinuria 1.4 g/24h, not in nephrotic range  Second degree AV block again at monitor around 10 AM. He confirmed that he fell asleep around that time.  PLAN: Add Bid[place]. Needs outpatient sleep study. I think his OSA is the cause for his intermittent second degree AV block. OSA needs to be treated promptly - can we get him equipment before formal sleep study is performed? Avoid beta blocker for now Treadmill  stress test as outpatient AFTER BP control is optimized.     Thurmon Fair, MD, [facility] [phone] 03/08/2015, 1:29 PM

## 2015-03-08 NOTE — Progress Notes (Signed)
Pt brady down x 2 HR 26 and 29 back up to 97, pt went into second degree heart block type 2, bp 160/102, advised Dr. Leatha Gilding, no new orders at this time.

## 2015-03-08 NOTE — Discharge Summary (Signed)
Name: Roger Ward MRN: [id number] DOB: [birth year] 34 y.o. PCP: No Pcp Per Patient  Date of Admission: 03/05/2015 11:31 AM Date of Discharge: 03/08/2015 Attending Physician: Levert Feinstein, MD  Discharge Diagnosis: Principal Problem:   Dyspnea Active Problems:   Edema   Cardiomyopathy   Acute combined systolic and diastolic congestive heart failure   Second degree AV block, Mobitz type I   OSA (obstructive sleep apnea)   Essential hypertension, malignant   Second degree AV block  Discharge Medications:   Medication List    STOP taking these medications        tobramycin 0.3 % ophthalmic solution  Commonly known as:  TOBREX      TAKE these medications        furosemide 40 MG tablet  Commonly known as:  LASIX  Take 1 tablet (40 mg total) by mouth 2 (two) times daily.     isosorbide-hydrALAZINE 20-37.5 MG per tablet  Commonly known as:  BIDIL  Take 1 tablet by mouth 3 (three) times daily.     lisinopril 5 MG tablet  Commonly known as:  PRINIVIL,ZESTRIL  Take 1 tablet (5 mg total) by mouth daily.        Disposition and follow-up:   Roger Ward was discharged from [facility] in Stable condition.  At the hospital follow up visit please address:  1.  Acute on Chronic Combined Heart Failure: New diagnosis; was started on lasix 40 mg BID. LVEF 40% with diffuse hypokinesis. Patient's weight was up on admission; he lost 7 pounds and 8.1 cumulative L by discharge. Please monitor for volume status and electrolytes  Cardiomyopathy: second degree AV block mobitz type II noted x2 on telemetry along with 10 beats of ventricular tachycardia. With diffuse hypokinesis, could be cardiac amyloidosis or cardiac sarcoidosis?  Cardiology follow up in April. Outpatient stress test recommended along with new medications.  Proteinuria: Some protein in urine, but not nephrotic range; please follow up pending urine studies  Hypertension: PO lasix 40 mg BID,  lisinopril 5 mg daily, bidil 3 tablets per day prescribed on this admission  OSA: Sleep study scheduled for 5/25 at 8PM at [facility]. CPAP ordered for home. Please confirm that patient has been using this.  Pre-Diabetes/Diabetes: Patient's A1c 6.5%; counseled on diet   2.  Labs / imaging needed at time of follow-up: BMET (in context of new lasix use)  3.  Pending labs/ test needing follow-up: Protein electrophoresis and kappa/lambda light chains pending.  Follow-up Appointments: Follow-up Information    Follow up with Jeanine Luz, FNP On 03/11/2015.   Specialty:  Family Medicine   Why:  10:00 AM   Contact information:   [address] [place] [address] [phone]       Follow up with Runell Gess, MD On 04/06/2015.   Specialty:  Cardiology   Why:  9:00AM   Contact information:   [address] [place] [address] [phone]       Follow up with [facility] On 05/11/2015.   Why:  8:00PM   Contact information:   [address] [place] [address] [phone]      Discharge Instructions: Please do all you can to cut down on the salt and sugar in your diet; this will help you high blood pressure, diabetes, heart and kidneys.   Diabetes Mellitus and Food It is important for you to manage your blood sugar (  glucose) level. Your blood glucose level can be greatly affected by what you eat. Eating healthier foods in the appropriate amounts throughout the day at about the same time each day will help you control your blood glucose level. It can also help slow or prevent worsening of your diabetes mellitus. Healthy eating may even help you improve the level of your blood pressure and reach or maintain a healthy weight.  HOW CAN FOOD AFFECT ME? Carbohydrates Carbohydrates affect your blood glucose level more than any other type of food. Your dietitian will help you determine how many carbohydrates to eat at each  meal and teach you how to count carbohydrates. Counting carbohydrates is important to keep your blood glucose at a healthy level, especially if you are using insulin or taking certain medicines for diabetes mellitus. Alcohol Alcohol can cause sudden decreases in blood glucose (hypoglycemia), especially if you use insulin or take certain medicines for diabetes mellitus. Hypoglycemia can be a life-threatening condition. Symptoms of hypoglycemia (sleepiness, dizziness, and disorientation) are similar to symptoms of having too much alcohol.  If your health care provider has given you approval to drink alcohol, do so in moderation and use the following guidelines:  Women should not have more than one drink per day, and men should not have more than two drinks per day. One drink is equal to:  12 oz of beer.  5 oz of wine.  1 oz of hard liquor.  Do not drink on an empty stomach.  Keep yourself hydrated. Have water, diet soda, or unsweetened iced tea.  Regular soda, juice, and other mixers might contain a lot of carbohydrates and should be counted. WHAT FOODS ARE NOT RECOMMENDED? As you make food choices, it is important to remember that all foods are not the same. Some foods have fewer nutrients per serving than other foods, even though they might have the same number of calories or carbohydrates. It is difficult to get your body what it needs when you eat foods with fewer nutrients. Examples of foods that you should avoid that are high in calories and carbohydrates but low in nutrients include:  Trans fats (most processed foods list trans fats on the Nutrition Facts label).  Regular soda.  Juice.  Candy.  Sweets, such as cake, pie, doughnuts, and cookies.  Fried foods. WHAT FOODS CAN I EAT? Have nutrient-rich foods, which will nourish your body and keep you healthy. The food you should eat also will depend on several factors, including:  The calories you need.  The medicines you  take.  Your weight.  Your blood glucose level.  Your blood pressure level.  Your cholesterol level. You also should eat a variety of foods, including:  Protein, such as meat, poultry, fish, tofu, nuts, and seeds (lean animal proteins are best).  Fruits.  Vegetables.  Dairy products, such as milk, cheese, and yogurt (low fat is best).  Breads, grains, pasta, cereal, rice, and beans.  Fats such as olive oil, trans fat-free margarine, canola oil, avocado, and olives. DOES EVERYONE WITH DIABETES MELLITUS HAVE THE SAME MEAL PLAN? Because every person with diabetes mellitus is different, there is not one meal plan that works for everyone. It is very important that you meet with a dietitian who will help you create a meal plan that is just right for you. Document Released: 08/30/2005 Document Revised: 12/08/2013 Document Reviewed: 10/30/2013 [facility] Patient Information 2015 [place], [place]. This information is not intended to replace advice given to you by your health  care provider. Make sure you discuss any questions you have with your health care provider. Low-Sodium Eating Plan Sodium raises blood pressure and causes water to be held in the body. Getting less sodium from food will help lower your blood pressure, reduce any swelling, and protect your heart, liver, and kidneys. We get sodium by adding salt (sodium chloride) to food. Most of our sodium comes from canned, boxed, and frozen foods. Restaurant foods, fast foods, and pizza are also very high in sodium. Even if you take medicine to lower your blood pressure or to reduce fluid in your body, getting less sodium from your food is important. WHAT IS MY PLAN? Most people should limit their sodium intake to 2,300 mg a day. Your health care provider recommends that you limit your sodium intake to __________ a day.  WHAT DO I NEED TO KNOW ABOUT THIS EATING PLAN? For the low-sodium eating plan, you will follow these general  guidelines:  Choose foods with a % Daily Value for sodium of less than 5% (as listed on the food label).   Use salt-free seasonings or herbs instead of table salt or sea salt.   Check with your health care provider or pharmacist before using salt substitutes.   Eat fresh foods.  Eat more vegetables and fruits.  Limit canned vegetables. If you do use them, rinse them well to decrease the sodium.   Limit cheese to 1 oz (28 g) per day.   Eat lower-sodium products, often labeled as "lower sodium" or "no salt added."  Avoid foods that contain monosodium glutamate (MSG). MSG is sometimes added to [place] food and some canned foods.  Check food labels (Nutrition Facts labels) on foods to learn how much sodium is in one serving.  Eat more home-cooked food and less restaurant, buffet, and fast food.  When eating at a restaurant, ask that your food be prepared with less salt or none, if possible.  HOW DO I READ FOOD LABELS FOR SODIUM INFORMATION? The Nutrition Facts label lists the amount of sodium in one serving of the food. If you eat more than one serving, you must multiply the listed amount of sodium by the number of servings. Food labels may also identify foods as:  Sodium free--Less than 5 mg in a serving.  Very low sodium--35 mg or less in a serving.  Low sodium--140 mg or less in a serving.  Light in sodium--50% less sodium in a serving. For example, if a food that usually has 300 mg of sodium is changed to become light in sodium, it will have 150 mg of sodium.  Reduced sodium--25% less sodium in a serving. For example, if a food that usually has 400 mg of sodium is changed to reduced sodium, it will have 300 mg of sodium. WHAT FOODS CAN I EAT? Grains Low-sodium cereals, including oats, puffed wheat and rice, and shredded wheat cereals. Low-sodium crackers. Unsalted rice and pasta. Lower-sodium bread.  Vegetables Frozen or fresh vegetables. Low-sodium or  reduced-sodium canned vegetables. Low-sodium or reduced-sodium tomato sauce and paste. Low-sodium or reduced-sodium tomato and vegetable juices.  Fruits Fresh, frozen, and canned fruit. Fruit juice.  Meat and Other Protein Products Low-sodium canned tuna and salmon. Fresh or frozen meat, poultry, seafood, and fish. Lamb. Unsalted nuts. Dried beans, peas, and lentils without added salt. Unsalted canned beans. Homemade soups without salt. Eggs.  Dairy Milk. Soy milk. Ricotta cheese. Low-sodium or reduced-sodium cheeses. Yogurt.  Condiments Fresh and dried herbs and spices. Salt-free seasonings.  Onion and garlic powders. Low-sodium varieties of mustard and ketchup. Lemon juice.  Fats and Oils Reduced-sodium salad dressings. Unsalted butter.  Other Unsalted popcorn and pretzels.  The items listed above may not be a complete list of recommended foods or beverages. Contact your dietitian for more options. WHAT FOODS ARE NOT RECOMMENDED? Grains Instant hot cereals. Bread stuffing, pancake, and biscuit mixes. Croutons. Seasoned rice or pasta mixes. Noodle soup cups. Boxed or frozen macaroni and cheese. Self-rising flour. Regular salted crackers. Vegetables Regular canned vegetables. Regular canned tomato sauce and paste. Regular tomato and vegetable juices. Frozen vegetables in sauces. Salted french fries. Olives. Rosita Fire. Relishes. Sauerkraut. Salsa. Meat and Other Protein Products Salted, canned, smoked, spiced, or pickled meats, seafood, or fish. Bacon, ham, sausage, hot dogs, corned beef, chipped beef, and packaged luncheon meats. Salt pork. Jerky. Pickled herring. Anchovies, regular canned tuna, and sardines. Salted nuts. Dairy Processed cheese and cheese spreads. Cheese curds. Blue cheese and cottage cheese. Buttermilk.  Condiments Onion and garlic salt, seasoned salt, table salt, and sea salt. Canned and packaged gravies. Worcestershire sauce. Tartar sauce. Barbecue sauce.  Teriyaki sauce. Soy sauce, including reduced sodium. Steak sauce. Fish sauce. Oyster sauce. Cocktail sauce. Horseradish. Regular ketchup and mustard. Meat flavorings and tenderizers. Bouillon cubes. Hot sauce. Tabasco sauce. Marinades. Taco seasonings. Relishes. Fats and Oils Regular salad dressings. Salted butter. Margarine. Ghee. Bacon fat.  Other Potato and tortilla chips. Corn chips and puffs. Salted popcorn and pretzels. Canned or dried soups. Pizza. Frozen entrees and pot pies.  The items listed above may not be a complete list of foods and beverages to avoid. Contact your dietitian for more information. Document Released: 05/25/2002 Document Revised: 12/08/2013 Document Reviewed: 10/07/2013 [facility] Patient Information 2015 [place], [place]. This information is not intended to replace advice given to you by your health care provider. Make sure you discuss any questions you have with your health care provider.  Discharge Instructions    For home use only DME continuous positive airway pressure (CPAP)    Complete by:  As directed   Patient has OSA or probable OSA:  Yes  Is the patient currently using CPAP in the home:  No  If no (to question two) date of sleep study:  To be scheduled by PCP  Date of face to face encounter:  03/08/15  Settings:  5-10  Signs and symptoms of probable OSA  (select all that apply):  Witnessed apneas  CPAP supplies needed:  Mask, headgear, cushions, filters, heated tubing and water chamber           Consultations: Treatment Team:  Rounding Lbcardiology, MD  Procedures Performed:  Dg Chest 2 View  03/05/2015   CLINICAL DATA:  Pt states he has had a non-productive cough and SOB x 4months. He also claims swelling of his face, ankles and abdomen x 1week. No prior injury or surgery to his chest. Current smoker: 1/2 ppd  EXAM: CHEST  2 VIEW  COMPARISON:  02/07/2010  FINDINGS: Cardiac silhouette is borderline enlarged and larger than it was on the prior study.  There is central vascular congestion and bilateral interstitial thickening, also new. No mediastinal or hilar masses. No focal areas of lung consolidation. No pleural effusion or pneumothorax.  Bony thorax is intact.  IMPRESSION: 1. Borderline enlargement of the cardiopericardial silhouette and mild bilateral interstitial thickening. Findings may reflect mild interstitial edema. Interstitial inflammation or infection is possible.   Electronically Signed   By: Amie Portland M.D.   On: 03/05/2015 12:04  2D Echo: 03/06/15 Study Conclusions - Left ventricle: The cavity size was mildly to moderately dilated. Wall thickness was increased in a pattern of mild LVH. The estimated ejection fraction was 40%. Diffuse hypokinesis. Doppler parameters are consistent with high ventricular filling pressure. - Mitral valve: There was moderate regurgitation. - Left atrium: The atrium was mildly dilated. - Right ventricle: The cavity size was mildly dilated. Systolic function was mildly reduced. - Right atrium: The atrium was mildly dilated. - Pulmonary arteries: PA peak pressure: 45 mm Hg (S).  Admission HPI: Roger Ward is a 34 yo man with no known medical history who presented to the hospital due to his shortness of breath and edema. He first noted shortness of breath about 4 months ago. He has had an occasionally productive cough with wheezing since that time. The shortness of breath is worst when he ambulates and at night. Though he has only been sleeping on one pillow, he does admit to waking up gasping for breath. He believes the shortness of breath has gotten worse since he first noticed it.   He noticed the swelling in his legs, then neck, face and whole body about a week ago. It has also gotten worse. He denies nausea, vomiting, diarrhea, fever, chills or urinary symptoms. He admits to a high fat, high salt diet. He is a Merchandiser, retail at [employer] and has had exposure to plastic floors at  work in the past; he currently works in an office. He has never lived with a bird or worked on a farm. He has a 20 pack year history of smoking.   Hospital Course by problem list: Principal Problem:   Dyspnea Active Problems:   Edema   Cardiomyopathy   Acute combined systolic and diastolic congestive heart failure   Second degree AV block, Mobitz type I   OSA (obstructive sleep apnea)   Essential hypertension, malignant   Second degree AV block   Roger Ward is a 34 yo man with no medical history who is here with hypertension and new dyspnea / ascites / full body edema. The cause of these symptoms is likely multifactorial; he has been found to have interstitial thickening on chest xray, obstructive sleep apnea, proteinuria on UA and a newly diagnosed cardiomyopathy (with runs of ventricular tachycardia and second degree AVB type 2) in the setting of volume overload.   Acute on Chronic Combined Heart Failure: The patient had no known history of heart failure, but his symptoms and EKG were worrisome for hypertensive cardiomyopathy. He admits to a poor diet and a family history of CHF (mother). His echo on this admission showed an LVEF 40% with diffuse hypokinesis, high ventricular filling pressure, pulmonary artery peak pressure 45 and signs of elevated central venous pressure. Lipids (cholesterol 166, triglycerides 248, HDL 42, LDL 74). TSH WNL. Troponins 0.07-->0.06 (likely due to volume overload). Concern for cardiomyopathy; considering cardiac amyloidosis, cardiac sarcoidosis. Admission weight was 50 lbs above baseline (per patient). Lasix 40 mg PO BID was initiated. Down 7 pounds and 8.1 L (cumulative) by day of discharge.   Cardiomyopathy: 10 beats of ventricular tachycardia noted on 03/07/15. Second degree AV block, Mobitz type II also noted x2. Patient also had slightly elevated troponins (to 0.07), likely due to volume overload. Outpatient nuclear stress test recommended along with new  medications. Patient has cardiology follow up with Dr. Allyson Sabal.  Proteinuria: Patient had 100 protein on UA. His physical exam (edema, ascites) was suggestive of nephrotic syndrome, but dipstick showed  protein/creatinine ratio of 0.66 (only slightly elevated) and 24hour protein showed 1.4 g (signifnicant, but does not meet criteria to qualify as nephrotic syndrome). Protein electrophoresis and kappa/lambda light chains pending.  Pre-Diabetes/Diabetes Type II: Patient technically has diabetes with an A1c 6.5%. Spoke with patient about low carbohydrate diet and provided resources. Manage with diet and lifestyle changes for now.  Observed Obstructive Sleep Apnea: Observed to be gasping for breath with brief periods where he appeared to not breath by RN (dropped to 60% saturation) and wife. CPAP initiated on this hospitalization without any problems at settings 5 to 7. OSA is likely contributing to his right heart strain and presentation. Also likely component of obesity hypoventilation syndrome. Patient to continue on CPAP at night at home and needs an outpatient sleep study.   Hypertension: Patient has never taken medications for his hypertension, but has been told on prior ED visits that he was hypertensive. Maintained in the 150s / 90s here with several peaks above this. Started PO lasix 40 mg BID and lisinopril 5 mg daily. Cardiology also recommended eventual incorporation of hydralazine and low dose nitrate (did not start them in the hospital, as patient already had introduction of two new medications).  Mild Bilateral Interstitial Thickening of Lungs on CXR: Patient has a smoking history; no history of exposures. Wheezing and rhonchi on exam. Could consider PFTs in future.  Discharge Vitals:   BP 158/93 mmHg  Pulse 93  Temp(Src) 98 F (36.7 C) (Oral)  Resp 18  Ht 5\' 11"  (1.803 m)  Wt 340 lb 14.4 oz (154.631 kg)  BMI 47.57 kg/m2  SpO2 99%  Discharge Labs:  Results for orders placed or  performed during the hospital encounter of 03/05/15 (from the past 24 hour(s))  Basic metabolic panel     Status: Abnormal   Collection Time: 03/08/15  4:55 AM  Result Value Ref Range   Sodium 140 135 - 145 mmol/L   Potassium 4.0 3.5 - 5.1 mmol/L   Chloride 100 96 - 112 mmol/L   CO2 33 (H) 19 - 32 mmol/L   Glucose, Bld 114 (H) 70 - 99 mg/dL   BUN 9 6 - 23 mg/dL   Creatinine, Ser 1.61 0.50 - 1.35 mg/dL   Calcium 8.5 8.4 - 09.6 mg/dL   GFR calc non Af Amer >90 >90 mL/min   GFR calc Af Amer >90 >90 mL/min   Anion gap 7 5 - 15    Signed: Stark Bray, MD 03/08/2015, 2:00 PM    Services Ordered on Discharge: none Equipment Ordered on Discharge: CPAP

## 2015-03-08 NOTE — Care Management Note (Signed)
CARE MANAGEMENT NOTE 03/08/2015  Patient:  Roger Ward, Roger Ward   Account Number:  [id number]  Date Initiated:  03/08/2015  Documentation initiated by:  Dalonda Simoni  Subjective/Objective Assessment:   CM following for progression and d/c planning.     Action/Plan:   C-PAP ordered for this pt and MD has arranged an outpatient sleep study for May 11, 2015 @ 8pm.   Anticipated DC Date:  03/08/2015   Anticipated DC Plan:  HOME W HOME HEALTH SERVICES         Choice offered to / List presented to:     DME arranged  CPAP      DME agency  [facility]        Status of service:  Completed, signed off Medicare Important Message given?  NA - LOS <3 / Initial given by admissions (If response is "NO", the following Medicare IM given date fields will be blank) Date Medicare IM given:   Medicare IM given by:   Date Additional Medicare IM given:   Additional Medicare IM given by:    Discharge Disposition:  HOME/SELF CARE  Per UR Regulation:    If discussed at Long Length of Stay Meetings, dates discussed:    Comments:

## 2015-03-08 NOTE — Progress Notes (Signed)
Principal Problem:   Dyspnea Active Problems:   Edema      Code Status Orders        Start     Ordered   03/05/15 1844  Full code   Continuous     03/05/15 1843      Length of Stay (days):3   SUBJECTIVE/24 HOUR EVENTS: 34 y.o. male with PMH significant for HTN and probable OSA with 3-4 months of SOB and 1-2 weeks of gradually worsening gross edema found to have low serum albumin, a protein positive U/A, increased protein/creatinine ratio, abnormal EKG with old RBBB and LAD, chest x-ray evident for mild cardiomegaly and interstitial thickening, increased troponins, increased BNP and ejection fraction of 40% and diffuse hypokenesis in the setting of bilateral ventricular and atrial dilation found on echo. Patient's SOB and edema continue to improve - slept with CPAP last night and states he had some issues with it being on too high of an oxygen setting - improved and he slept better when it was lowered. Patient continues to deny chest pain/palpitations/dizziness/headache/nausea/vomiting/diarrhea/dysuria/fever/chills. Cardiology consultation yesterday - they recommended outpatient stress test and sleep study and outpatient meds with ACEi/ARB and diuretic. Urine collection was completed yesterday and showed moderate proteinuria but not enough to meet nephrotic syndrome criteria. This is still unexplained from his relatively mild HTN and only slightly elevated A1c. PFT, protein electrophoresis, kappa/lambda light chain results still pending. Will review results to rule out possible amyloidosis as an etiology for his nephrotic syndrome and cardiac symptoms.   OBJECTIVE: Filed Vitals:   03/08/15 0036 03/08/15 0129 03/08/15 0436 03/08/15 0820  BP:   153/96 160/102  Pulse: 93  89 95  Temp:   98.2 F (36.8 C) 98 F (36.7 C)  TempSrc:   Oral Oral  Resp: 24  16 18   Height:      Weight:      SpO2: 97% 97% 97% 99%    Intake/Output Summary (Last 24 hours) at 03/08/15 1151 Last data filed at  03/08/15 0905  Gross per 24 hour  Intake   1164 ml  Output   1566 ml  Net   -402 ml    Intake/Output last 3 shifts: I/O last 3 completed shifts: In: 1024 [P.O.:1024] Out: 3841 [Urine:3840; Stool:1]  Allergies  Allergen Reactions  . Penicillins Swelling    Medications: Scheduled Meds: . enoxaparin (LOVENOX) injection  80 mg Subcutaneous Q24H  . furosemide  40 mg Oral BID  . lisinopril  5 mg Oral Daily   Continuous Infusions:  PRN Meds:.ipratropium-albuterol  Physical Exam: GEN: NAD, AAOx3, WDWN, obese  HEENT: MMM, EOMI, PERRLA, conjunctival injection NECK: no JVD, no carotid bruits CV: distant heart sounds, RRR, S1S2nl, no murmurs/rubs/gallops, no S3/S4 PULM: some end-expiratory wheezing bilaterally but significantly decreased from admission. No crackles.  ABD: normal/active bowel sounds, distended, non-tender. No rebound, no guarding, unable to appreciate hepatosplenomegaly. Bilateral striae in the lower abdominal quadrants.  EXT: gross edema. no cyanosis, clubbing  NEURO: CN II-XII intact, no focal deficits.  PSYCH: nl affect, nl speech MSK: nl ROM, no erythema    Labs: Recent Labs     03/05/15  1155   03/07/15  0639  03/08/15  0455  HGB  14.5   --    --    --   HCT  44.2   --    --    --   PLT  346   --    --    --   NA  141   < >  142  140  K  3.9   < >  3.8  4.0  CL  103   < >  102  100  CO2  31   < >  33*  33*  BUN  8   < >  8  9  CREATININE  1.07   < >  1.06  1.03  CALCIUM  8.6   < >  8.6  8.5   < > = values in this interval not displayed.    CBC Latest Ref Rng 03/05/2015 02/07/2010  WBC 4.0 - 10.5 K/uL 7.9 8.5  Hemoglobin 13.0 - 17.0 g/dL 16.1 09.6  Hematocrit 04.5 - 52.0 % 44.2 44.6  Platelets 150 - 400 K/uL 346 255    BMP Latest Ref Rng 03/08/2015 03/07/2015 03/06/2015  Glucose 70 - 99 mg/dL 409(W) 97 119(J)  BUN 6 - 23 mg/dL Creatinine 0.50 - 1.35 mg/dL 4.78 2.95 6.21  Sodium 135 - 145 mmol/L 140 142 142  Potassium 3.5 - 5.1  mmol/L 4.0 3.8 3.7  Chloride 96 - 112 mmol/L 100 102 101  CO2 19 - 32 mmol/L 33(H) 33(H) 38(H)  Calcium 8.4 - 10.5 mg/dL 8.5 8.6 8.7    CMP Latest Ref Rng 03/08/2015 03/07/2015 03/06/2015  Glucose 70 - 99 mg/dL 308(M) 97 578(I)  BUN 6 - 23 mg/dL Creatinine 0.50 - 1.35 mg/dL 6.96 2.95 2.84  Sodium 135 - 145 mmol/L 140 142 142  Potassium 3.5 - 5.1 mmol/L 4.0 3.8 3.7  Chloride 96 - 112 mmol/L 100 102 101  CO2 19 - 32 mmol/L 33(H) 33(H) 38(H)  Calcium 8.4 - 10.5 mg/dL 8.5 8.6 8.7  Total Protein 6.0 - 8.3 g/dL - - -  Total Bilirubin 0.3 - 1.2 mg/dL - - -  Alkaline Phos 39 - 117 U/L - - -  AST 0 - 37 U/L - - -  ALT 0 - 53 U/L - - -    Images: No new images.   Medications, Vitals, Labs, and Images reviewed.   ASSESSMENT AND PLAN: 34 y.o. male with PMH significant for HTN and probably OSA with 3-4 months of SOB and 1-2 weeks of gradually worsening gross edema found to have low serum albumin, a protein positive U/A and elevated 24hr protein and creatinine, abnormal EKG with old RBBB and LAD with VT and second degree heart block on telemetry, chest x-ray evident for mild cardiomegaly and interstitial thickening, echo showing EF of 40% with hypokinesis and diffuse dilatation, increased troponins and increased BNP being worked-up for possible nephrotic syndrome in the setting of uncontrolled HTN or amyloidosis. Patient is also being worked up for possible interstitial disease/pulmonary hypertension in the setting of obesity, OSA and smoking possibly leading to right heart failure given his gross edema, SOB, abnormal lung findings on physical exam, cardiomegaly found on chest x-ray and RBBB evident on repeat EKGs. Patient is being discharged today on lasix and lisinopril with CPAP for f/u with PCP and Cardiology.   Shortness of Breath: Likely secondary to fluid overload from nephrotic syndrome or cardiopulmonary disease.  - Diet: Heart Healthy Diet. Diet/Nutrition counseling needed. - Pending  labs for protein electrophoresis, kappa/lambda light chains to determine cause of underlying kidney disease as reason for fluid overload leading to SOB.  - Consider Pulmonary Function Test as outpatient   Edema: Likely secondary to underlying nephrotic syndrome and/or cardiopulmonary disease.  - oral lasix  BID  as outpatient  - protein electrophoresis and kappa/lamba light chains pending   HTN: BP stabilizing in the 140s/100s. Monitor and consider prescription as an outpatient (hydrocholorthiazide + ACEi or ARB).  - oral Lasix  BID on discharge  - lisinopril  once a day  - f/u with PCP and Cardiology  - outpatient stress test  - Lifestyle/diet modification and counseling   OSA:  - Sleep study as outpatient scheduled per PCP - CPAP on discharge   Obesity: Diet/lifestyle counseling. Exercise planning - spoke with patient about exercising minimum 3 times per week for at least 30 minutes.   Diabetes: Patient's A1c 6.5 in the setting of continuously elevated serum glucose on bmet (110-120), capillary glucose 103. This could also be another contributor to possible kidney disease.  - Counseled the patient on lifestlye/diet/exercise modifications to control as a primary prevention.  - Monitor for progression - if his blood sugars and A1c continue to rise consider starting metformin in the future.   Diet: Counseled patient on heart healthy and diabetes prevention diet low in carbohydrates, sugar and salt.   DVT PPX: Discontinue Lovenox on discharge.   F/u with PCP and Cardiology on discharge.

## 2015-03-08 NOTE — Discharge Instructions (Signed)
Please do all you can to cut down on the salt and sugar in your diet; this will help you high blood pressure, diabetes, heart and kidneys.   Diabetes Mellitus and Food It is important for you to manage your blood sugar (glucose) level. Your blood glucose level can be greatly affected by what you eat. Eating healthier foods in the appropriate amounts throughout the day at about the same time each day will help you control your blood glucose level. It can also help slow or prevent worsening of your diabetes mellitus. Healthy eating may even help you improve the level of your blood pressure and reach or maintain a healthy weight.  HOW CAN FOOD AFFECT ME? Carbohydrates Carbohydrates affect your blood glucose level more than any other type of food. Your dietitian will help you determine how many carbohydrates to eat at each meal and teach you how to count carbohydrates. Counting carbohydrates is important to keep your blood glucose at a healthy level, especially if you are using insulin or taking certain medicines for diabetes mellitus. Alcohol Alcohol can cause sudden decreases in blood glucose (hypoglycemia), especially if you use insulin or take certain medicines for diabetes mellitus. Hypoglycemia can be a life-threatening condition. Symptoms of hypoglycemia (sleepiness, dizziness, and disorientation) are similar to symptoms of having too much alcohol.  If your health care provider has given you approval to drink alcohol, do so in moderation and use the following guidelines:  Women should not have more than one drink per day, and men should not have more than two drinks per day. One drink is equal to:  12 oz of beer.  5 oz of wine.  1 oz of hard liquor.  Do not drink on an empty stomach.  Keep yourself hydrated. Have water, diet soda, or unsweetened iced tea.  Regular soda, juice, and other mixers might contain a lot of carbohydrates and should be counted. WHAT FOODS ARE NOT RECOMMENDED? As  you make food choices, it is important to remember that all foods are not the same. Some foods have fewer nutrients per serving than other foods, even though they might have the same number of calories or carbohydrates. It is difficult to get your body what it needs when you eat foods with fewer nutrients. Examples of foods that you should avoid that are high in calories and carbohydrates but low in nutrients include:  Trans fats (most processed foods list trans fats on the Nutrition Facts label).  Regular soda.  Juice.  Candy.  Sweets, such as cake, pie, doughnuts, and cookies.  Fried foods. WHAT FOODS CAN I EAT? Have nutrient-rich foods, which will nourish your body and keep you healthy. The food you should eat also will depend on several factors, including:  The calories you need.  The medicines you take.  Your weight.  Your blood glucose level.  Your blood pressure level.  Your cholesterol level. You also should eat a variety of foods, including:  Protein, such as meat, poultry, fish, tofu, nuts, and seeds (lean animal proteins are best).  Fruits.  Vegetables.  Dairy products, such as milk, cheese, and yogurt (low fat is best).  Breads, grains, pasta, cereal, rice, and beans.  Fats such as olive oil, trans fat-free margarine, canola oil, avocado, and olives. DOES EVERYONE WITH DIABETES MELLITUS HAVE THE SAME MEAL PLAN? Because every person with diabetes mellitus is different, there is not one meal plan that works for everyone. It is very important that you meet with a dietitian who  will help you create a meal plan that is just right for you. Document Released: 08/30/2005 Document Revised: 12/08/2013 Document Reviewed: 10/30/2013 [facility] Patient Information 2015 [place], [place]. This information is not intended to replace advice given to you by your health care provider. Make sure you discuss any questions you have with your health care provider. Low-Sodium Eating  Plan Sodium raises blood pressure and causes water to be held in the body. Getting less sodium from food will help lower your blood pressure, reduce any swelling, and protect your heart, liver, and kidneys. We get sodium by adding salt (sodium chloride) to food. Most of our sodium comes from canned, boxed, and frozen foods. Restaurant foods, fast foods, and pizza are also very high in sodium. Even if you take medicine to lower your blood pressure or to reduce fluid in your body, getting less sodium from your food is important. WHAT IS MY PLAN? Most people should limit their sodium intake to 2,300 mg a day. Your health care provider recommends that you limit your sodium intake to __________ a day.  WHAT DO I NEED TO KNOW ABOUT THIS EATING PLAN? For the low-sodium eating plan, you will follow these general guidelines:  Choose foods with a % Daily Value for sodium of less than 5% (as listed on the food label).   Use salt-free seasonings or herbs instead of table salt or sea salt.   Check with your health care provider or pharmacist before using salt substitutes.   Eat fresh foods.  Eat more vegetables and fruits.  Limit canned vegetables. If you do use them, rinse them well to decrease the sodium.   Limit cheese to 1 oz (28 g) per day.   Eat lower-sodium products, often labeled as "lower sodium" or "no salt added."  Avoid foods that contain monosodium glutamate (MSG). MSG is sometimes added to [place] food and some canned foods.  Check food labels (Nutrition Facts labels) on foods to learn how much sodium is in one serving.  Eat more home-cooked food and less restaurant, buffet, and fast food.  When eating at a restaurant, ask that your food be prepared with less salt or none, if possible.  HOW DO I READ FOOD LABELS FOR SODIUM INFORMATION? The Nutrition Facts label lists the amount of sodium in one serving of the food. If you eat more than one serving, you must multiply the  listed amount of sodium by the number of servings. Food labels may also identify foods as:  Sodium free--Less than 5 mg in a serving.  Very low sodium--35 mg or less in a serving.  Low sodium--140 mg or less in a serving.  Light in sodium--50% less sodium in a serving. For example, if a food that usually has 300 mg of sodium is changed to become light in sodium, it will have 150 mg of sodium.  Reduced sodium--25% less sodium in a serving. For example, if a food that usually has 400 mg of sodium is changed to reduced sodium, it will have 300 mg of sodium. WHAT FOODS CAN I EAT? Grains Low-sodium cereals, including oats, puffed wheat and rice, and shredded wheat cereals. Low-sodium crackers. Unsalted rice and pasta. Lower-sodium bread.  Vegetables Frozen or fresh vegetables. Low-sodium or reduced-sodium canned vegetables. Low-sodium or reduced-sodium tomato sauce and paste. Low-sodium or reduced-sodium tomato and vegetable juices.  Fruits Fresh, frozen, and canned fruit. Fruit juice.  Meat and Other Protein Products Low-sodium canned tuna and salmon. Fresh or frozen meat, poultry, seafood, and  fish. [place]. Unsalted nuts. Dried beans, peas, and lentils without added salt. Unsalted canned beans. Homemade soups without salt. Eggs.  Dairy Milk. Soy milk. Ricotta cheese. Low-sodium or reduced-sodium cheeses. Yogurt.  Condiments Fresh and dried herbs and spices. Salt-free seasonings. Onion and garlic powders. Low-sodium varieties of mustard and ketchup. Lemon juice.  Fats and Oils Reduced-sodium salad dressings. Unsalted butter.  Other Unsalted popcorn and pretzels.  The items listed above may not be a complete list of recommended foods or beverages. Contact your dietitian for more options. WHAT FOODS ARE NOT RECOMMENDED? Grains Instant hot cereals. Bread stuffing, pancake, and biscuit mixes. Croutons. Seasoned rice or pasta mixes. Noodle soup cups. Boxed or frozen macaroni and  cheese. Self-rising flour. Regular salted crackers. Vegetables Regular canned vegetables. Regular canned tomato sauce and paste. Regular tomato and vegetable juices. Frozen vegetables in sauces. Salted french fries. Olives. Rosita Fire. Relishes. Sauerkraut. Salsa. Meat and Other Protein Products Salted, canned, smoked, spiced, or pickled meats, seafood, or fish. Bacon, ham, sausage, hot dogs, corned beef, chipped beef, and packaged luncheon meats. Salt pork. Jerky. Pickled herring. Anchovies, regular canned tuna, and sardines. Salted nuts. Dairy Processed cheese and cheese spreads. Cheese curds. Blue cheese and cottage cheese. Buttermilk.  Condiments Onion and garlic salt, seasoned salt, table salt, and sea salt. Canned and packaged gravies. Worcestershire sauce. Tartar sauce. Barbecue sauce. Teriyaki sauce. Soy sauce, including reduced sodium. Steak sauce. Fish sauce. Oyster sauce. Cocktail sauce. Horseradish. Regular ketchup and mustard. Meat flavorings and tenderizers. Bouillon cubes. Hot sauce. Tabasco sauce. Marinades. Taco seasonings. Relishes. Fats and Oils Regular salad dressings. Salted butter. Margarine. Ghee. Bacon fat.  Other Potato and tortilla chips. Corn chips and puffs. Salted popcorn and pretzels. Canned or dried soups. Pizza. Frozen entrees and pot pies.  The items listed above may not be a complete list of foods and beverages to avoid. Contact your dietitian for more information. Document Released: 05/25/2002 Document Revised: 12/08/2013 Document Reviewed: 10/07/2013 [facility] Patient Information 2015 [place], [place]. This information is not intended to replace advice given to you by your health care provider. Make sure you discuss any questions you have with your health care provider.

## 2015-03-08 NOTE — Progress Notes (Signed)
Pt stated that CPAP was "blowing to much air". CPAP was adjusted from 7 cmH2O to 5 cmH2O. Pt Vitals stable. Pt is comfortable and stated this was better. RT will continue to monitor.

## 2015-03-08 NOTE — Progress Notes (Signed)
Subjective: Roger Ward feels "pretty good" this morning. He has had no more cramping, but feels that his swelling is continuing to decrease.  Objective: Vital signs in last 24 hours: Filed Vitals:   03/08/15 0036 03/08/15 0129 03/08/15 0436 03/08/15 0820  BP:   153/96 160/102  Pulse: 93  89 95  Temp:   98.2 F (36.8 C) 98 F (36.7 C)  TempSrc:   Oral Oral  Resp: Height:      Weight:      SpO2: 97% 97% 97% 99%   Weight change: -1.6 oz (-0.045 kg)  Intake/Output Summary (Last 24 hours) at 03/08/15 1119 Last data filed at 03/08/15 0905  Gross per 24 hour  Intake   1164 ml  Output   1566 ml  Net   -402 ml   Physical Exam: Appearance: obese African-American man in NAD, sitting up at side of bed with wife at bedside, CPAP in place, TV on HEENT: AT/[place], PERRL, EOMi, no periorbital edema, neck and face slightly less full Heart: distant heart sounds, RRR, normal S1S2 Lungs: distant lung sounds, faint diffuse rhonchi and expiratory wheezes, normal effort Abdomen: BS+, soft, nontender, ascites evident with shifting dullness, extensive striae across abdomen that patient says are new Musculoskeletal: normal range of motion Extremities: 1+ pitting edema to knees Neurologic: A&Ox3, grossly intact Skin: no rashes or lesions  Lab Results: Basic Metabolic Panel:  Recent Labs Lab 03/06/15 2147 03/07/15 0639 03/08/15 0455  NA 142 142 140  K 3.7 3.8 4.0  CL 101 102 100  CO2 38* 33* 33*  GLUCOSE 119* 97 114*  BUN CREATININE 1.22 1.06 1.03  CALCIUM 8.7 8.6 8.5  MG 2.1  --   --    Liver Function Tests:  Recent Labs Lab 03/05/15 1943  AST 36  ALT 36  ALKPHOS 46  BILITOT 1.0  PROT 5.8*  ALBUMIN 2.9*   CBC:  Recent Labs Lab 03/05/15 1155  WBC 7.9  HGB 14.5  HCT 44.2  MCV 80.2  PLT 346   Cardiac Enzymes:  Recent Labs Lab 03/05/15 1943 03/06/15 0155 03/06/15 0843  TROPONINI 0.07* 0.06* 0.06*   CBG:  Recent Labs Lab 03/05/15 1658    GLUCAP 103*   Fasting Lipid Panel:  Recent Labs Lab 03/06/15 0155  CHOL 166  HDL 42  LDLCALC 74  TRIG 248*  CHOLHDL 4.0   Thyroid Function Tests:  Recent Labs Lab 03/05/15 1943  TSH 0.568   Urinalysis:  Recent Labs Lab 03/05/15 1155  COLORURINE YELLOW  LABSPEC 1.012  PHURINE 5.5  GLUCOSEU NEGATIVE  HGBUR TRACE*  BILIRUBINUR NEGATIVE  KETONESUR NEGATIVE  PROTEINUR 100*  UROBILINOGEN 1.0  NITRITE NEGATIVE  LEUKOCYTESUR NEGATIVE   Studies/Results: No results found. Medications: I have reviewed the patient's current medications. Scheduled Meds: . enoxaparin (LOVENOX) injection  80 mg Subcutaneous Q24H  . furosemide  40 mg Oral BID  . lisinopril  5 mg Oral Daily   Continuous Infusions:  PRN Meds:.ipratropium-albuterol Assessment/Plan: Principal Problem:   Dyspnea Active Problems:   Edema  Roger Ward is a 34 yo man with no medical history who is here with hypertension and new dyspnea / ascites / full body edema. The cause of these symptoms is likely multifactorial; he has been found to have interstitial thickening on chest xray, obstructive sleep apnea, proteinuria on UA and a newly diagnosed cardiomyopathy (with runs of ventricular tachycardia and second degree AVB type 2) in the  setting of volume overload.   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 He admits to a poor diet and a family history of CHF (mother). His echo on this admission showed an LVEF 40% with diffuse hypokinesis, high ventricular filling pressure, pulmonary artery peak pressure 45 and signs of elevated central venous pressure. Lipids (cholesterol 166, triglycerides 248, HDL 42, LDL 74). TSH WNL. Troponins 0.07-->0.06 (likely due to volume overload). Admission weight was 50 lbs above baseline (per patient). Down 7 pounds and 8.1 L (cummulative). 10 beats of ventricular tachycardia and two occurrences of  brief second degree AV block, Mobitz type II on telemetry yesterday. Concern for cardiomyopathy, considering cardiac amyloidosis, cardiac sarcoidosis. - Continue lasix PO 40 mg BID - Appreciate cardiology consult  - Ins & Outs - Telemetry - Telemetry  Ventricular Tachycardia on Telemetry: 10 beats noted on 03/07/15. Patient also had slightly elevated troponins (to 0.07), likely due to volume overload.  - Outpatient nuclear stress test  Proteinuria: Patient had 100 protein on UA. His physical exam supports nephrotic syndrome. Dipstick protein 82, creatinine 125.17, ratio 0.66 (H), 24 hour test showed 1.4 g protein; significant, but not suggestive of nephrotic syndrome. - Protein electrophoresis and kappa/lambda light chains pending  Pre-Diabetes/Diabetes Type II: Patient technically has diabetes with an A1c 6.5%.  - Spoke with patient about low carbohydrate diet and provided resources - Manage with diet and lifestyle changes for now  Likely Obstructive Sleep Apnea: Observed to be gasping for breath with brief periods where he appeared to not breath by RN (dropped to 60% saturation). CPAP initiated on this hospitalization without any problems. If confirmed, this would be contributing to right heart failure / current presentation. Also likely component of obesity hypoventilation syndrome.  - Continue CPAP at night and arrange for CPAP at home - Sleep study to investigate as outpatient - O2 saturations at night  Hypertension: Patient has never taken medications for his hypertension, but has been told on prior ED visits that he was hypertensive. Currently 160s/100s, previously up to 172/121. - Continue PO lasix 40 mg BID - Start lisinopril 5 mg daily  Mild Bilateral Interstitial Thickening of Lungs on CXR: Patient has a smoking history; no history of exposures. Wheezing and rhonchi on exam.  - Consider PFTs in future - Duonebs PRN  Diet: Heart healthy  DVT Ppx: Lovenox  Dispo: Disposition  is deferred at this time, awaiting improvement of current medical problems.  Anticipated discharge in approximately 0 day(s).   The patient does not have a current PCP (No Pcp Per Patient) and does not know need an [facility] hospital follow-up appointment after discharge.  The patient does not have transportation limitations that hinder transportation to clinic appointments.  .Services Needed at time of discharge: Y = Yes, Blank = No PT:   OT:   RN:   Equipment:   Other:     LOS: 3 days   Stark Bray, MD 03/08/2015, 11:19 AM

## 2015-03-08 NOTE — Progress Notes (Signed)
Pt bp 160/102, paged teaching service, pt not any antihypertensives at this time.

## 2015-03-09 ENCOUNTER — Encounter: Payer: Self-pay | Admitting: Family

## 2015-03-09 ENCOUNTER — Telehealth: Payer: Self-pay | Admitting: Family

## 2015-03-09 ENCOUNTER — Ambulatory Visit (INDEPENDENT_AMBULATORY_CARE_PROVIDER_SITE_OTHER): Payer: 59 | Admitting: Family

## 2015-03-09 ENCOUNTER — Other Ambulatory Visit (INDEPENDENT_AMBULATORY_CARE_PROVIDER_SITE_OTHER): Payer: 59

## 2015-03-09 VITALS — BP 140/94 | HR 105 | Temp 98.1°F | Resp 20 | Ht 73.0 in | Wt 337.8 lb

## 2015-03-09 DIAGNOSIS — I10 Essential (primary) hypertension: Secondary | ICD-10-CM | POA: Diagnosis not present

## 2015-03-09 DIAGNOSIS — I5041 Acute combined systolic (congestive) and diastolic (congestive) heart failure: Secondary | ICD-10-CM | POA: Diagnosis not present

## 2015-03-09 DIAGNOSIS — G4733 Obstructive sleep apnea (adult) (pediatric): Secondary | ICD-10-CM

## 2015-03-09 DIAGNOSIS — E119 Type 2 diabetes mellitus without complications: Secondary | ICD-10-CM

## 2015-03-09 LAB — BASIC METABOLIC PANEL
BUN: 12 mg/dL (ref 6–23)
CO2: 33 mEq/L — ABNORMAL HIGH (ref 19–32)
Calcium: 8.9 mg/dL (ref 8.4–10.5)
Chloride: 103 mEq/L (ref 96–112)
Creatinine, Ser: 0.99 mg/dL (ref 0.40–1.50)
GFR: 111.65 mL/min (ref >60.00)
Glucose, Bld: 101 mg/dL — ABNORMAL HIGH (ref 70–99)
Potassium: 4.3 mEq/L (ref 3.5–5.1)
SODIUM: 138 meq/L (ref 135–145)

## 2015-03-09 LAB — UIFE/LIGHT CHAINS/TP QN, 24-HR UR
ALPHA 2 UR: DETECTED — AB
Albumin, U: DETECTED
Alpha 1, Urine: DETECTED — AB
Beta, Urine: DETECTED — AB
GAMMA UR: DETECTED — AB
Total Protein, Urine: 56 mg/dL — ABNORMAL HIGH (ref 5–25)

## 2015-03-09 LAB — CREATININE CLEARANCE, URINE, 24 HOUR
Collection Interval-CRCL: 24 h
Creatinine Clearance: 202 mL/min — ABNORMAL HIGH (ref 75–125)
Creatinine, 24H Ur: 3085 mg/d — ABNORMAL HIGH (ref 800–2000)
Creatinine, Urine: 61.7 mg/dL
Urine Total Volume-CRCL: 5000 mL

## 2015-03-09 NOTE — Assessment & Plan Note (Signed)
Patient received his CPAP machine yesterday. Notes some improved sleep with the machine thus far. Has a sleep study scheduled for May 25. He will continue to use the automated settings until that time. Goal would be to lose approximately 5-10% of his body weight which would help with his breathing. Follow up if symptoms worsen or fail to improve.

## 2015-03-09 NOTE — Assessment & Plan Note (Signed)
Symptoms improved with diuresis. Discussed with patient importance of daily weights and decreasing sodium in his diet while increasing physical activity. Patient continues to appear with slight fluid overload and residual cough. Continue furosemide at current dosage. Follow up with cardiology as scheduled.

## 2015-03-09 NOTE — Assessment & Plan Note (Signed)
Blood pressure is slightly elevated today however improved. Continue current lisinopril, BiDil, and furosemide. Follow-up after cardiology.

## 2015-03-09 NOTE — Progress Notes (Signed)
Subjective:    Patient ID: Roger Ward, male    DOB: [birth year], 34 y.o.   MRN: [id number]  Chief Complaint  Patient presents with  . Establish Care    was just in the hospital for SOB and swelling in legs, says he is doing better    HPI:  Roger Ward is a 34 y.o. male who presents today to establish care and discuss a recent hospitalization.   Was recently admitted to the hospital for shortness of breath. After work up was found to have acute on chronic heart failure, cardiomyopathy, hypertension and the obstructive sleep apnea. He was diuresed and lost approximately 7 lbs of fluid. All hospital records were reviewed in detail.   Indicates he has improved breathing but still has associated cough related to his heart failure.   1) OSA - Started on CPAP and has a scheduled sleep study on 5/25. Previously experienced the associated symptoms daytime fatigue and sleepiness. Notes that he started it last night and has seen some improvement.   2) Blood pressure - currently maintained on lasix, lisinopril and bidil.  BP Readings from Last 3 Encounters:  03/09/15 140/94  03/08/15 127/100  09/23/12 167/97   3) Acute on chronic heart failure - Improved since diuresed 7 lbs. Indicates that he feels better, but still has an occasional associated cough. He has a follow up with cardiology in April.  4) Pre-diabetes/Diabetes - In hospital A1c was 6.5 indicating potential diabetes. Will recheck A1c in a month to determine confirmation.     Allergies  Allergen Reactions  . Penicillins Swelling    Current Outpatient Prescriptions on File Prior to Visit  Medication Sig Dispense Refill  . furosemide (LASIX) 40 MG tablet Take 1 tablet (40 mg total) by mouth 2 (two) times daily. 60 tablet 0  . isosorbide-hydrALAZINE (BIDIL) 20-37.5 MG per tablet Take 1 tablet by mouth 3 (three) times daily. 90 tablet 0  . lisinopril (PRINIVIL,ZESTRIL) 5 MG tablet Take 1 tablet (5 mg total) by mouth daily.  30 tablet 0   No current facility-administered medications on file prior to visit.    Past Medical History  Diagnosis Date  . Hypertension   . Family history of adverse reaction to anesthesia     mother and aunt  both put to sleep for surgery and "stopped breathing"  . Asthma     Childhood  . Heart disease     History reviewed. No pertinent past surgical history.  Family History  Problem Relation Age of Onset  . Hypertension Mother   . Hyperlipidemia Mother   . Heart failure Mother   . Multiple sclerosis Mother   . Breast cancer Maternal Grandmother   . Diabetes Maternal Grandmother   . Other Maternal Uncle     Colon Cancer    History   Social History  . Marital Status: Married    Spouse Name: N/A  . Number of Children: 0  . Years of Education: 12   Occupational History  . Supervisor at [employer]    Social History Main Topics  . Smoking status: Former Smoker -- 0.25 packs/day for 19 years    Types: Cigarettes    Start date: 01/05/1996    Quit date: 03/04/2015  . Smokeless tobacco: Never Used     Comment: Already decreasaed smoking  . Alcohol Use: 0.6 - 1.2 oz/week    1-2 Standard drinks or equivalent per week     Comment: Occasional  . Drug Use:  No  . Sexual Activity: Yes    Birth Control/ Protection: Condom   Other Topics Concern  . Not on file   Social History Narrative   Born and raised in [place]. Fun: Bowling, movies, we go   Denies religious beliefs that would effect health care.     Review of Systems  Eyes:       Negative for changes in vision.   Respiratory: Positive for cough. Negative for chest tightness, shortness of breath and wheezing.   Cardiovascular: Negative for chest pain, palpitations and leg swelling.  Endocrine: Negative for polydipsia, polyphagia and polyuria.  Neurological: Negative for headaches.      Objective:    BP 140/94 mmHg  Pulse 105  Temp(Src) 98.1 F (36.7 C) (Oral)  Resp 20  Ht 6\' 1"  (1.854 m)   Wt 337 lb 12.8 oz (153.225 kg)  BMI 44.58 kg/m2  SpO2 92% Nursing note and vital signs reviewed.  Physical Exam  Constitutional: He is oriented to person, place, and time. He appears well-developed and well-nourished. No distress.  Morbidly obese male seated in the chair, dressed appropriately and appears slightly older than his stated age.   Cardiovascular: Normal rate, regular rhythm, normal heart sounds and intact distal pulses.   Pulmonary/Chest: Effort normal and breath sounds normal.  Neurological: He is alert and oriented to person, place, and time.  Skin: Skin is warm and dry.  Psychiatric: He has a normal mood and affect. His behavior is normal. Judgment and thought content normal.       Assessment & Plan:

## 2015-03-09 NOTE — Patient Instructions (Addendum)
Thank you for choosing [facility].  Summary/Instructions:  Please stop by the lab on the basement level of the building for your blood work. Your results will be released to MyChart (or called to you) after review, usually within 72 hours after test completion. If any changes need to be made, you will be notified at that same time.  If your symptoms worsen or fail to improve, please contact our office for further instruction, or in case of emergency go directly to the emergency room at the closest medical facility.    Heart Failure Heart failure is a condition in which the heart has trouble pumping blood. This means your heart does not pump blood efficiently for your body to work well. In some cases of heart failure, fluid may back up into your lungs or you may have swelling (edema) in your lower legs. Heart failure is usually a long-term (chronic) condition. It is important for you to take good care of yourself and follow your health care provider's treatment plan. CAUSES  Some health conditions can cause heart failure. Those health conditions include:  High blood pressure (hypertension). Hypertension causes the heart muscle to work harder than normal. When pressure in the blood vessels is high, the heart needs to pump (contract) with more force in order to circulate blood throughout the body. High blood pressure eventually causes the heart to become stiff and weak.  Coronary artery disease (CAD). CAD is the buildup of cholesterol and fat (plaque) in the arteries of the heart. The blockage in the arteries deprives the heart muscle of oxygen and blood. This can cause chest pain and may lead to a heart attack. High blood pressure can also contribute to CAD.  Heart attack (myocardial infarction). A heart attack occurs when one or more arteries in the heart become blocked. The loss of oxygen damages the muscle tissue of the heart. When this happens, part of the heart muscle dies. The injured  tissue does not contract as well and weakens the heart's ability to pump blood.  Abnormal heart valves. When the heart valves do not open and close properly, it can cause heart failure. This makes the heart muscle pump harder to keep the blood flowing.  Heart muscle disease (cardiomyopathy or myocarditis). Heart muscle disease is damage to the heart muscle from a variety of causes. These can include drug or alcohol abuse, infections, or unknown reasons. These can increase the risk of heart failure.  Lung disease. Lung disease makes the heart work harder because the lungs do not work properly. This can cause a strain on the heart, leading it to fail.  Diabetes. Diabetes increases the risk of heart failure. High blood sugar contributes to high fat (lipid) levels in the blood. Diabetes can also cause slow damage to tiny blood vessels that carry important nutrients to the heart muscle. When the heart does not get enough oxygen and food, it can cause the heart to become weak and stiff. This leads to a heart that does not contract efficiently.  Other conditions can contribute to heart failure. These include abnormal heart rhythms, thyroid problems, and low blood counts (anemia). Certain unhealthy behaviors can increase the risk of heart failure, including:  Being overweight.  Smoking or chewing tobacco.  Eating foods high in fat and cholesterol.  Abusing illicit drugs or alcohol.  Lacking physical activity. SYMPTOMS  Heart failure symptoms may vary and can be hard to detect. Symptoms may include:  Shortness of breath with activity, such as climbing  stairs.  Persistent cough.  Swelling of the feet, ankles, legs, or abdomen.  Unexplained weight gain.  Difficulty breathing when lying flat (orthopnea).  Waking from sleep because of the need to sit up and get more air.  Rapid heartbeat.  Fatigue and loss of energy.  Feeling light-headed, dizzy, or close to fainting.  Loss of  appetite.  Nausea.  Increased urination during the night (nocturia). DIAGNOSIS  A diagnosis of heart failure is based on your history, symptoms, physical examination, and diagnostic tests. Diagnostic tests for heart failure may include:  Echocardiography.  Electrocardiography.  Chest X-ray.  Blood tests.  Exercise stress test.  Cardiac angiography.  Radionuclide scans. TREATMENT  Treatment is aimed at managing the symptoms of heart failure. Medicines, behavioral changes, or surgical intervention may be necessary to treat heart failure.  Medicines to help treat heart failure may include:  Angiotensin-converting enzyme (ACE) inhibitors. This type of medicine blocks the effects of a blood protein called angiotensin-converting enzyme. ACE inhibitors relax (dilate) the blood vessels and help lower blood pressure.  Angiotensin receptor blockers (ARBs). This type of medicine blocks the actions of a blood protein called angiotensin. Angiotensin receptor blockers dilate the blood vessels and help lower blood pressure.  Water pills (diuretics). Diuretics cause the kidneys to remove salt and water from the blood. The extra fluid is removed through urination. This loss of extra fluid lowers the volume of blood the heart pumps.  Beta blockers. These prevent the heart from beating too fast and improve heart muscle strength.  Digitalis. This increases the force of the heartbeat.  Healthy behavior changes include:  Obtaining and maintaining a healthy weight.  Stopping smoking or chewing tobacco.  Eating heart-healthy foods.  Limiting or avoiding alcohol.  Stopping illicit drug use.  Physical activity as directed by your health care provider.  Surgical treatment for heart failure may include:  A procedure to open blocked arteries, repair damaged heart valves, or remove damaged heart muscle tissue.  A pacemaker to improve heart muscle function and control certain abnormal heart  rhythms.  An internal cardioverter defibrillator to treat certain serious abnormal heart rhythms.  A left ventricular assist device (LVAD) to assist the pumping ability of the heart. HOME CARE INSTRUCTIONS   Take medicines only as directed by your health care provider. Medicines are important in reducing the workload of your heart, slowing the progression of heart failure, and improving your symptoms.  Do not stop taking your medicine unless directed by your health care provider.  Do not skip any dose of medicine.  Refill your prescriptions before you run out of medicine. Your medicines are needed every day.  Engage in moderate physical activity if directed by your health care provider. Moderate physical activity can benefit some people. The elderly and people with severe heart failure should consult with a health care provider for physical activity recommendations.  Eat heart-healthy foods. Food choices should be free of trans fat and low in saturated fat, cholesterol, and salt (sodium). Healthy choices include fresh or frozen fruits and vegetables, fish, lean meats, legumes, fat-free or low-fat dairy products, and whole grain or high fiber foods. Talk to a dietitian to learn more about heart-healthy foods.  Limit sodium if directed by your health care provider. Sodium restriction may reduce symptoms of heart failure in some people. Talk to a dietitian to learn more about heart-healthy seasonings.  Use healthy cooking methods. Healthy cooking methods include roasting, grilling, broiling, baking, poaching, steaming, or stir-frying. Talk to a dietitian  to learn more about healthy cooking methods.  Limit fluids if directed by your health care provider. Fluid restriction may reduce symptoms of heart failure in some people.  Weigh yourself every day. Daily weights are important in the early recognition of excess fluid. You should weigh yourself every morning after you urinate and before you eat  breakfast. Wear the same amount of clothing each time you weigh yourself. Record your daily weight. Provide your health care provider with your weight record.  Monitor and record your blood pressure if directed by your health care provider.  Check your pulse if directed by your health care provider.  Lose weight if directed by your health care provider. Weight loss may reduce symptoms of heart failure in some people.  Stop smoking or chewing tobacco. Nicotine makes your heart work harder by causing your blood vessels to constrict. Do not use nicotine gum or patches before talking to your health care provider.  Keep all follow-up visits as directed by your health care provider. This is important.  Limit alcohol intake to no more than 1 drink per day for nonpregnant women and 2 drinks per day for men. One drink equals 12 ounces of beer, 5 ounces of wine, or 1 ounces of hard liquor. Drinking more than that is harmful to your heart. Tell your health care provider if you drink alcohol several times a week. Talk with your health care provider about whether alcohol is safe for you. If your heart has already been damaged by alcohol or you have severe heart failure, drinking alcohol should be stopped completely.  Stop illicit drug use.  Stay up-to-date with immunizations. It is especially important to prevent respiratory infections through current pneumococcal and influenza immunizations.  Manage other health conditions such as hypertension, diabetes, thyroid disease, or abnormal heart rhythms as directed by your health care provider.  Learn to manage stress.  Plan rest periods when fatigued.  Learn strategies to manage high temperatures. If the weather is extremely hot:  Avoid vigorous physical activity.  Use air conditioning or fans or seek a cooler location.  Avoid caffeine and alcohol.  Wear loose-fitting, lightweight, and light-colored clothing.  Learn strategies to manage cold  temperatures. If the weather is extremely cold:  Avoid vigorous physical activity.  Layer clothes.  Wear mittens or gloves, a hat, and a scarf when going outside.  Avoid alcohol.  Obtain ongoing education and support as needed.  Participate in or seek rehabilitation as needed to maintain or improve independence and quality of life. SEEK MEDICAL CARE IF:   Your weight increases by 03 lb/1.4 kg in 1 day or 05 lb/2.3 kg in a week.  You have increasing shortness of breath that is unusual for you.  You are unable to participate in your usual physical activities.  You tire easily.  You cough more than normal, especially with physical activity.  You have any or more swelling in areas such as your hands, feet, ankles, or abdomen.  You are unable to sleep because it is hard to breathe.  You feel like your heart is beating fast (palpitations).  You become dizzy or light-headed upon standing up. SEEK IMMEDIATE MEDICAL CARE IF:   You have difficulty breathing.  There is a change in mental status such as decreased alertness or difficulty with concentration.  You have a pain or discomfort in your chest.  You have an episode of fainting (syncope). MAKE SURE YOU:   Understand these instructions.  Will watch your condition.  Will get help right away if you are not doing well or get worse. Document Released: 12/03/2005 Document Revised: 04/19/2014 Document Reviewed: 01/02/2013 [facility] Patient Information 2015 [place], [place]. This information is not intended to replace advice given to you by your health care provider. Make sure you discuss any questions you have with your health care provider.

## 2015-03-09 NOTE — Assessment & Plan Note (Addendum)
Patient with A1c of 6.5, which is consistent with type 2 diabetes. No treatment is indicated at this time from a pharmacological standpoint. Continue to work on lifestyle behaviors including increasing nutrient density and decreasing saturated fats while increasing his physical activity as tolerated. If A1c continues to rise, we will start metformin. Follow-up in 3 months for repeat A1c

## 2015-03-09 NOTE — Telephone Encounter (Signed)
Please inform patient that his blood work looks good and his electrolytes and kidney function are normal.

## 2015-03-09 NOTE — Progress Notes (Signed)
Pre visit review using our clinic review tool, if applicable. No additional management support is needed unless otherwise documented below in the visit note. 

## 2015-03-10 NOTE — Telephone Encounter (Signed)
Tried calling pt. No VM will try back later

## 2015-03-11 ENCOUNTER — Ambulatory Visit: Payer: 59 | Admitting: Family

## 2015-03-14 NOTE — Telephone Encounter (Signed)
Lab results sent in the mail. 

## 2015-03-23 DIAGNOSIS — Z0279 Encounter for issue of other medical certificate: Secondary | ICD-10-CM

## 2015-04-06 ENCOUNTER — Ambulatory Visit: Payer: 59 | Admitting: Cardiovascular Disease

## 2015-04-16 ENCOUNTER — Other Ambulatory Visit: Payer: Self-pay | Admitting: Internal Medicine

## 2015-04-22 ENCOUNTER — Telehealth: Payer: Self-pay | Admitting: Family

## 2015-04-22 MED ORDER — ISOSORB DINITRATE-HYDRALAZINE 20-37.5 MG PO TABS
1.0000 | ORAL_TABLET | Freq: Three times a day (TID) | ORAL | Status: DC
Start: 2015-04-22 — End: 2015-07-17

## 2015-04-22 MED ORDER — LISINOPRIL 5 MG PO TABS: 5.0000 mg | ORAL_TABLET | Freq: Every day | ORAL | Status: DC

## 2015-04-22 MED ORDER — FUROSEMIDE 40 MG PO TABS: 40.0000 mg | ORAL_TABLET | Freq: Two times a day (BID) | ORAL | Status: DC

## 2015-04-22 NOTE — Telephone Encounter (Signed)
Patient is requesting refill for lasix, bidil and lisinopril to be sent to [facility] on [address].

## 2015-04-22 NOTE — Telephone Encounter (Signed)
Medications refilled

## 2015-06-09 ENCOUNTER — Ambulatory Visit: Payer: 59 | Admitting: Family

## 2015-06-10 ENCOUNTER — Telehealth: Payer: Self-pay | Admitting: Family

## 2015-06-10 NOTE — Telephone Encounter (Signed)
Ok to reschedule if he calls back 

## 2015-06-10 NOTE — Telephone Encounter (Signed)
Patient no showed for acute visit 6/23.  Please advise.

## 2015-06-10 NOTE — Telephone Encounter (Signed)
Noted  

## 2015-07-17 ENCOUNTER — Other Ambulatory Visit: Payer: Self-pay | Admitting: Family

## 2015-08-13 ENCOUNTER — Other Ambulatory Visit: Payer: Self-pay | Admitting: Family

## 2015-10-10 ENCOUNTER — Ambulatory Visit (INDEPENDENT_AMBULATORY_CARE_PROVIDER_SITE_OTHER): Payer: 59 | Admitting: Family

## 2015-10-10 ENCOUNTER — Other Ambulatory Visit (INDEPENDENT_AMBULATORY_CARE_PROVIDER_SITE_OTHER): Payer: 59

## 2015-10-10 ENCOUNTER — Telehealth: Payer: Self-pay | Admitting: Family

## 2015-10-10 ENCOUNTER — Encounter: Payer: Self-pay | Admitting: Family

## 2015-10-10 VITALS — BP 138/98 | HR 102 | Temp 99.4°F | Resp 18 | Ht 73.0 in | Wt 338.0 lb

## 2015-10-10 DIAGNOSIS — I5041 Acute combined systolic (congestive) and diastolic (congestive) heart failure: Secondary | ICD-10-CM

## 2015-10-10 DIAGNOSIS — G4733 Obstructive sleep apnea (adult) (pediatric): Secondary | ICD-10-CM

## 2015-10-10 DIAGNOSIS — E118 Type 2 diabetes mellitus with unspecified complications: Secondary | ICD-10-CM

## 2015-10-10 DIAGNOSIS — I10 Essential (primary) hypertension: Secondary | ICD-10-CM

## 2015-10-10 LAB — HEMOGLOBIN A1C: Hgb A1c MFr Bld: 6.5 % (ref 4.6–6.5)

## 2015-10-10 LAB — COMPREHENSIVE METABOLIC PANEL
ALT: 19 U/L (ref 0–53)
AST: 21 U/L (ref 0–37)
Albumin: 3.9 g/dL (ref 3.5–5.2)
Alkaline Phosphatase: 52 U/L (ref 39–117)
BILIRUBIN TOTAL: 0.5 mg/dL (ref 0.2–1.2)
BUN: 13 mg/dL (ref 6–23)
CO2: 33 meq/L — AB (ref 19–32)
Calcium: 9.4 mg/dL (ref 8.4–10.5)
Chloride: 102 mEq/L (ref 96–112)
Creatinine, Ser: 1.14 mg/dL (ref 0.40–1.50)
GFR: 94.54 mL/min (ref >60.00)
GLUCOSE: 98 mg/dL (ref 70–99)
Potassium: 3.9 mEq/L (ref 3.5–5.1)
Sodium: 141 mEq/L (ref 135–145)
TOTAL PROTEIN: 7.7 g/dL (ref 6.0–8.3)

## 2015-10-10 MED ORDER — CARVEDILOL 6.25 MG PO TABS
6.2500 mg | ORAL_TABLET | Freq: Two times a day (BID) | ORAL | Status: DC
Start: 2015-10-10 — End: 2015-11-01

## 2015-10-10 NOTE — Telephone Encounter (Signed)
Please inform patient that his A1c remains at 6.5 for which no medication is needed at this time. I would like to see him back in about 1 month to complete the rest of his diabetes prevention recommendations. Also his kidney function and electrolytes currently look good.

## 2015-10-10 NOTE — Assessment & Plan Note (Signed)
Prescribed CPAP and currently non-compliant with regimen secondary to "taking it off at night." Most likely a contributing factor to his lower extremity edema. Encouraged to use CPAP as prescribed. Refer to pulmonology for sleep apnea evaluation and management of CPAP settings.

## 2015-10-10 NOTE — Assessment & Plan Note (Signed)
Blood pressure remains above goal of 140/90. Appears compliant with current regimen and denies adverse side effects or hypertensive events. Continue current dosage of lisinopril and furosemide. Start carvedilol to assess for blood pressure management and heart failure management. Monitor blood pressure at home. Follow-up in one month.

## 2015-10-10 NOTE — Progress Notes (Signed)
Pre visit review using our clinic review tool, if applicable. No additional management support is needed unless otherwise documented below in the visit note. 

## 2015-10-10 NOTE — Patient Instructions (Addendum)
Thank you for choosing [facility].  Summary/Instructions:  Please start the carvedilol.  Please elevate your legs, decrease the sodium in your diet, compression socks as needed.  Your prescription(s) have been submitted to your pharmacy or been printed and provided for you. Please take as directed and contact our office if you believe you are having problem(s) with the medication(s) or have any questions.  Please stop by the lab on the basement level of the building for your blood work. Your results will be released to MyChart (or called to you) after review, usually within 72 hours after test completion. If any changes need to be made, you will be notified at that same time.  Referrals have been made during this visit. You should expect to hear back from our schedulers in about 7-10 days in regards to establishing an appointment with the specialists we discussed.   If your symptoms worsen or fail to improve, please contact our office for further instruction, or in case of emergency go directly to the emergency room at the closest medical facility.    Heart Failure Heart failure is a condition in which the heart has trouble pumping blood. This means your heart does not pump blood efficiently for your body to work well. In some cases of heart failure, fluid may back up into your lungs or you may have swelling (edema) in your lower legs. Heart failure is usually a long-term (chronic) condition. It is important for you to take good care of yourself and follow your health care provider's treatment plan. CAUSES  Some health conditions can cause heart failure. Those health conditions include:  High blood pressure (hypertension). Hypertension causes the heart muscle to work harder than normal. When pressure in the blood vessels is high, the heart needs to pump (contract) with more force in order to circulate blood throughout the body. High blood pressure eventually causes the heart to become  stiff and weak.  Coronary artery disease (CAD). CAD is the buildup of cholesterol and fat (plaque) in the arteries of the heart. The blockage in the arteries deprives the heart muscle of oxygen and blood. This can cause chest pain and may lead to a heart attack. High blood pressure can also contribute to CAD.  Heart attack (myocardial infarction). A heart attack occurs when one or more arteries in the heart become blocked. The loss of oxygen damages the muscle tissue of the heart. When this happens, part of the heart muscle dies. The injured tissue does not contract as well and weakens the heart's ability to pump blood.  Abnormal heart valves. When the heart valves do not open and close properly, it can cause heart failure. This makes the heart muscle pump harder to keep the blood flowing.  Heart muscle disease (cardiomyopathy or myocarditis). Heart muscle disease is damage to the heart muscle from a variety of causes. These can include drug or alcohol abuse, infections, or unknown reasons. These can increase the risk of heart failure.  Lung disease. Lung disease makes the heart work harder because the lungs do not work properly. This can cause a strain on the heart, leading it to fail.  Diabetes. Diabetes increases the risk of heart failure. High blood sugar contributes to high fat (lipid) levels in the blood. Diabetes can also cause slow damage to tiny blood vessels that carry important nutrients to the heart muscle. When the heart does not get enough oxygen and food, it can cause the heart to become weak and stiff. This  leads to a heart that does not contract efficiently.  Other conditions can contribute to heart failure. These include abnormal heart rhythms, thyroid problems, and low blood counts (anemia). Certain unhealthy behaviors can increase the risk of heart failure, including: 1. Being overweight. 2. Smoking or chewing tobacco. 3. Eating foods high in fat and cholesterol. 4. Abusing  illicit drugs or alcohol. 5. Lacking physical activity. SYMPTOMS  Heart failure symptoms may vary and can be hard to detect. Symptoms may include:  Shortness of breath with activity, such as climbing stairs.  Persistent cough.  Swelling of the feet, ankles, legs, or abdomen.  Unexplained weight gain.  Difficulty breathing when lying flat (orthopnea).  Waking from sleep because of the need to sit up and get more air.  Rapid heartbeat.  Fatigue and loss of energy.  Feeling light-headed, dizzy, or close to fainting.  Loss of appetite.  Nausea.  Increased urination during the night (nocturia). DIAGNOSIS  A diagnosis of heart failure is based on your history, symptoms, physical examination, and diagnostic tests. Diagnostic tests for heart failure may include:  Echocardiography.  Electrocardiography.  Chest X-ray.  Blood tests.  Exercise stress test.  Cardiac angiography.  Radionuclide scans. TREATMENT  Treatment is aimed at managing the symptoms of heart failure. Medicines, behavioral changes, or surgical intervention may be necessary to treat heart failure.  Medicines to help treat heart failure may include:  Angiotensin-converting enzyme (ACE) inhibitors. This type of medicine blocks the effects of a blood protein called angiotensin-converting enzyme. ACE inhibitors relax (dilate) the blood vessels and help lower blood pressure.  Angiotensin receptor blockers (ARBs). This type of medicine blocks the actions of a blood protein called angiotensin. Angiotensin receptor blockers dilate the blood vessels and help lower blood pressure.  Water pills (diuretics). Diuretics cause the kidneys to remove salt and water from the blood. The extra fluid is removed through urination. This loss of extra fluid lowers the volume of blood the heart pumps.  Beta blockers. These prevent the heart from beating too fast and improve heart muscle strength.  Digitalis. This increases the  force of the heartbeat.  Healthy behavior changes include:  Obtaining and maintaining a healthy weight.  Stopping smoking or chewing tobacco.  Eating heart-healthy foods.  Limiting or avoiding alcohol.  Stopping illicit drug use.  Physical activity as directed by your health care provider.  Surgical treatment for heart failure may include:  A procedure to open blocked arteries, repair damaged heart valves, or remove damaged heart muscle tissue.  A pacemaker to improve heart muscle function and control certain abnormal heart rhythms.  An internal cardioverter defibrillator to treat certain serious abnormal heart rhythms.  A left ventricular assist device (LVAD) to assist the pumping ability of the heart. HOME CARE INSTRUCTIONS   Take medicines only as directed by your health care provider. Medicines are important in reducing the workload of your heart, slowing the progression of heart failure, and improving your symptoms.  Do not stop taking your medicine unless directed by your health care provider.  Do not skip any dose of medicine.  Refill your prescriptions before you run out of medicine. Your medicines are needed every day.  Engage in moderate physical activity if directed by your health care provider. Moderate physical activity can benefit some people. The elderly and people with severe heart failure should consult with a health care provider for physical activity recommendations.  Eat heart-healthy foods. Food choices should be free of trans fat and low in saturated fat,  cholesterol, and salt (sodium). Healthy choices include fresh or frozen fruits and vegetables, fish, lean meats, legumes, fat-free or low-fat dairy products, and whole grain or high fiber foods. Talk to a dietitian to learn more about heart-healthy foods.  Limit sodium if directed by your health care provider. Sodium restriction may reduce symptoms of heart failure in some people. Talk to a dietitian to  learn more about heart-healthy seasonings.  Use healthy cooking methods. Healthy cooking methods include roasting, grilling, broiling, baking, poaching, steaming, or stir-frying. Talk to a dietitian to learn more about healthy cooking methods.  Limit fluids if directed by your health care provider. Fluid restriction may reduce symptoms of heart failure in some people.  Weigh yourself every day. Daily weights are important in the early recognition of excess fluid. You should weigh yourself every morning after you urinate and before you eat breakfast. Wear the same amount of clothing each time you weigh yourself. Record your daily weight. Provide your health care provider with your weight record.  Monitor and record your blood pressure if directed by your health care provider.  Check your pulse if directed by your health care provider.  Lose weight if directed by your health care provider. Weight loss may reduce symptoms of heart failure in some people.  Stop smoking or chewing tobacco. Nicotine makes your heart work harder by causing your blood vessels to constrict. Do not use nicotine gum or patches before talking to your health care provider.  Keep all follow-up visits as directed by your health care provider. This is important.  Limit alcohol intake to no more than 1 drink per day for nonpregnant women and 2 drinks per day for men. One drink equals 12 ounces of beer, 5 ounces of wine, or 1 ounces of hard liquor. Drinking more than that is harmful to your heart. Tell your health care provider if you drink alcohol several times a week. Talk with your health care provider about whether alcohol is safe for you. If your heart has already been damaged by alcohol or you have severe heart failure, drinking alcohol should be stopped completely.  Stop illicit drug use.  Stay up-to-date with immunizations. It is especially important to prevent respiratory infections through current pneumococcal and  influenza immunizations.  Manage other health conditions such as hypertension, diabetes, thyroid disease, or abnormal heart rhythms as directed by your health care provider.  Learn to manage stress.  Plan rest periods when fatigued.  Learn strategies to manage high temperatures. If the weather is extremely hot:  Avoid vigorous physical activity.  Use air conditioning or fans or seek a cooler location.  Avoid caffeine and alcohol.  Wear loose-fitting, lightweight, and light-colored clothing.  Learn strategies to manage cold temperatures. If the weather is extremely cold:  Avoid vigorous physical activity.  Layer clothes.  Wear mittens or gloves, a hat, and a scarf when going outside.  Avoid alcohol.  Obtain ongoing education and support as needed.  Participate in or seek rehabilitation as needed to maintain or improve independence and quality of life. SEEK MEDICAL CARE IF:   You have a rapid weight gain.  You have increasing shortness of breath that is unusual for you.  You are unable to participate in your usual physical activities.  You tire easily.  You cough more than normal, especially with physical activity.  You have any or more swelling in areas such as your hands, feet, ankles, or abdomen.  You are unable to sleep because it is hard  to breathe.  You feel like your heart is beating fast (palpitations).  You become dizzy or light-headed upon standing up. SEEK IMMEDIATE MEDICAL CARE IF:   You have difficulty breathing.  There is a change in mental status such as decreased alertness or difficulty with concentration.  You have a pain or discomfort in your chest.  You have an episode of fainting (syncope). MAKE SURE YOU:   Understand these instructions.  Will watch your condition.  Will get help right away if you are not doing well or get worse.   This information is not intended to replace advice given to you by your health care provider. Make  sure you discuss any questions you have with your health care provider.   Document Released: 12/03/2005 Document Revised: 04/19/2015 Document Reviewed: 01/02/2013 Elsevier Interactive Patient Education 2016 [employer]  Sleep Apnea  Sleep apnea is a sleep disorder characterized by abnormal pauses in breathing while you sleep. When your breathing pauses, the level of oxygen in your blood decreases. This causes you to move out of deep sleep and into light sleep. As a result, your quality of sleep is poor, and the system that carries your blood throughout your body (cardiovascular system) experiences stress. If sleep apnea remains untreated, the following conditions can develop:  High blood pressure (hypertension).  Coronary artery disease.  Inability to achieve or maintain an erection (impotence).  Impairment of your thought process (cognitive dysfunction). There are three types of sleep apnea: 6. Obstructive sleep apnea--Pauses in breathing during sleep because of a blocked airway. 7. Central sleep apnea--Pauses in breathing during sleep because the area of the brain that controls your breathing does not send the correct signals to the muscles that control breathing. 8. Mixed sleep apnea--A combination of both obstructive and central sleep apnea. RISK FACTORS The following risk factors can increase your risk of developing sleep apnea:  Being overweight.  Smoking.  Having narrow passages in your nose and throat.  Being of older age.  Being male.  Alcohol use.  Sedative and tranquilizer use.  Ethnicity. Among individuals younger than 35 years, African Americans are at increased risk of sleep apnea. SYMPTOMS   Difficulty staying asleep.  Daytime sleepiness and fatigue.  Loss of energy.  Irritability.  Loud, heavy snoring.  Morning headaches.  Trouble concentrating.  Forgetfulness.  Decreased interest in sex.  Unexplained sleepiness. DIAGNOSIS  In order to  diagnose sleep apnea, your caregiver will perform a physical examination. A sleep study done in the comfort of your own home may be appropriate if you are otherwise healthy. Your caregiver may also recommend that you spend the night in a sleep lab. In the sleep lab, several monitors record information about your heart, lungs, and brain while you sleep. Your leg and arm movements and blood oxygen level are also recorded. TREATMENT The following actions may help to resolve mild sleep apnea:  Sleeping on your side.   Using a decongestant if you have nasal congestion.   Avoiding the use of depressants, including alcohol, sedatives, and narcotics.   Losing weight and modifying your diet if you are overweight. There also are devices and treatments to help open your airway:  Oral appliances. These are custom-made mouthpieces that shift your lower jaw forward and slightly open your bite. This opens your airway.  Devices that create positive airway pressure. This positive pressure "splints" your airway open to help you breathe better during sleep. The following devices create positive airway pressure:  Continuous positive airway  pressure (CPAP) device. The CPAP device creates a continuous level of air pressure with an air pump. The air is delivered to your airway through a mask while you sleep. This continuous pressure keeps your airway open.  Nasal expiratory positive airway pressure (EPAP) device. The EPAP device creates positive air pressure as you exhale. The device consists of single-use valves, which are inserted into each nostril and held in place by adhesive. The valves create very little resistance when you inhale but create much more resistance when you exhale. That increased resistance creates the positive airway pressure. This positive pressure while you exhale keeps your airway open, making it easier to breath when you inhale again.  Bilevel positive airway pressure (BPAP) device. The  BPAP device is used mainly in patients with central sleep apnea. This device is similar to the CPAP device because it also uses an air pump to deliver continuous air pressure through a mask. However, with the BPAP machine, the pressure is set at two different levels. The pressure when you exhale is lower than the pressure when you inhale.  Surgery. Typically, surgery is only done if you cannot comply with less invasive treatments or if the less invasive treatments do not improve your condition. Surgery involves removing excess tissue in your airway to create a wider passage way.   This information is not intended to replace advice given to you by your health care provider. Make sure you discuss any questions you have with your health care provider.   Document Released: 11/23/2002 Document Revised: 12/24/2014 Document Reviewed: 04/10/2012 Elsevier Interactive Patient Education [employer]

## 2015-10-10 NOTE — Progress Notes (Signed)
Subjective:    Patient ID: Roger Ward, male    DOB: June 10, 1981, 34 y.o.   MRN: [id number]  Chief Complaint  Patient presents with  . Leg Swelling    states he has been having leg swelling on and off and he is taking lasix like he is supposed to    HPI:  Roger WINZELER is a 34 y.o. male who  has a past medical history of Hypertension; Family history of adverse reaction to anesthesia; Asthma; and Heart disease. and presents today for a follow up office visit.   1.) Leg swelling - Associated symptom of leg swelling has been going on and off for for the about 7 months. Modifying factors include furosemide which he takes as prescribed and denies adverse side effects. Denies any chest pain or shortness of breath at rest. Does have some shortness of breath with exertion.  Wt Readings from Last 3 Encounters:  10/10/15 338 lb (153.316 kg)  03/09/15 337 lb 12.8 oz (153.225 kg)  03/07/15 340 lb 14.4 oz (154.631 kg)    2.) Obstructive Sleep Apnea - Previously diagnosed with obstructive sleep apnea when he was in the hospital and has never had a formal sleep study performed. Currently prescribed as CPAP and reports night time awakenings. He reports non-complaince with the CPAP. Believes that he takes it off as he sleeps.   3.) Hypertension - Currently maintained on lisinopril and furosemide. Takes the medications as prescribed and denies adverse side effects or hypotensive events. 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   BP Readings from Last 3 Encounters:  10/10/15 138/98  03/09/15 140/94  03/08/15 127/100     Allergies  Allergen Reactions  . Penicillins Swelling    Current Outpatient Prescriptions on File Prior to Visit  Medication Sig Dispense Refill  . BIDIL 20-37.5 MG per tablet TAKE 1 TABLET BY MOUTH 3 TIMES DAILY. 90 tablet 0  . furosemide (LASIX) 40 MG tablet TAKE 1 TABLET BY MOUTH 2 TIMES DAILY. 180  tablet 0  . lisinopril (PRINIVIL,ZESTRIL) 5 MG tablet TAKE 1 TABLET BY MOUTH DAILY. 90 tablet 0   No current facility-administered medications on file prior to visit.    Past Medical History  Diagnosis Date  . Hypertension   . Family history of adverse reaction to anesthesia     mother and aunt  both put to sleep for surgery and "stopped breathing"  . Asthma     Childhood  . Heart disease     Review of Systems  Constitutional: Negative for fever and chills.  Respiratory: Positive for cough. Negative for chest tightness and shortness of breath.   Cardiovascular: Negative for chest pain, palpitations and leg swelling.  Neurological: Negative for headaches.      Objective:    BP 138/98 mmHg  Pulse 102  Temp(Src) 99.4 F (37.4 C) (Oral)  Resp 18  Ht 6\' 1"  (1.854 m)  Wt 338 lb (153.316 kg)  BMI 44.60 kg/m2  SpO2 96% Nursing note and vital signs reviewed.  Physical Exam  Constitutional: He is oriented to person, place, and time. He appears well-developed and well-nourished. No distress.  Obese male seated in the chair and dressed appropriately for the situation and appears about his stated age.   Cardiovascular: Normal rate, regular rhythm, normal heart sounds and intact distal pulses.   Questionable scant edema noted bilaterally with good pulses.   Pulmonary/Chest: Effort normal and  breath sounds normal. He has no wheezes. He has no rales.  Neurological: He is alert and oriented to person, place, and time.  Skin: Skin is warm and dry.  Psychiatric: He has a normal mood and affect. His behavior is normal. Judgment and thought content normal.       Assessment & Plan:   Problem List Items Addressed This Visit      Cardiovascular and Mediastinum   Acute combined systolic and diastolic congestive heart failure (HCC)    Lower extremity edema most likely related to heart failure and sleep apnea. Denies shortness of breath at present with minimal lower extremity edema. Does  not appear to be fluid overloaded at present. Start carvediolol for beta-blocker protection. Continue current dosage of Bidil. Refer to cardiology to establish care and future management of heart failure. Educated regarding importance of lifestyle changes and managing chronic conditions to slow progression. Follow up pending referral to cardiology.       Relevant Medications   carvedilol (COREG) 6.25 MG tablet   Other Relevant Orders   Comprehensive metabolic panel (Completed)   Ambulatory referral to Cardiology   Essential hypertension, malignant    Blood pressure remains above goal of 140/90. Appears compliant with current regimen and denies adverse side effects or hypertensive events. Continue current dosage of lisinopril and furosemide. Start carvedilol to assess for blood pressure management and heart failure management. Monitor blood pressure at home. Follow-up in one month.      Relevant Medications   carvedilol (COREG) 6.25 MG tablet     Respiratory   OSA (obstructive sleep apnea) - Primary    Prescribed CPAP and currently non-compliant with regimen secondary to "taking it off at night." Most likely a contributing factor to his lower extremity edema. Encouraged to use CPAP as prescribed. Refer to pulmonology for sleep apnea evaluation and management of CPAP settings.       Relevant Orders   Ambulatory referral to Pulmonology     Endocrine   Type 2 diabetes mellitus (HCC)    Obain A1c to check current status and CMET to check kidney function. Not currently maintained on medication and does not check his blood sugar at home. Discussed possible addition of a statin to his current regimen in addition to the lisinopril to reduce his risk for CAD. He will schedule an eye exam independently. Declines Pneumovax at this time. Counseled on importance of lifestyle changes to assist with managing blood pressure.       Relevant Orders   Comprehensive metabolic panel (Completed)   Hemoglobin  A1c (Completed)

## 2015-10-10 NOTE — Assessment & Plan Note (Signed)
Lower extremity edema most likely related to heart failure and sleep apnea. Denies shortness of breath at present with minimal lower extremity edema. Does not appear to be fluid overloaded at present. Start carvediolol for beta-blocker protection. Continue current dosage of Bidil. Refer to cardiology to establish care and future management of heart failure. Educated regarding importance of lifestyle changes and managing chronic conditions to slow progression. Follow up pending referral to cardiology.

## 2015-10-10 NOTE — Assessment & Plan Note (Addendum)
Obain A1c to check current status and CMET to check kidney function. Not currently maintained on medication and does not check his blood sugar at home. Discussed possible addition of a statin to his current regimen in addition to the lisinopril to reduce his risk for CAD. He will schedule an eye exam independently. Declines Pneumovax at this time. Counseled on importance of lifestyle changes to assist with managing blood pressure.

## 2015-10-11 NOTE — Telephone Encounter (Signed)
Pt aware of results 

## 2015-10-27 NOTE — Progress Notes (Signed)
No Show

## 2015-10-28 ENCOUNTER — Encounter: Payer: 59 | Admitting: Physician Assistant

## 2015-10-28 ENCOUNTER — Encounter: Payer: Self-pay | Admitting: Physician Assistant

## 2015-10-28 ENCOUNTER — Encounter: Payer: 59 | Admitting: Cardiovascular Disease

## 2015-10-28 DIAGNOSIS — I472 Ventricular tachycardia: Secondary | ICD-10-CM | POA: Insufficient documentation

## 2015-10-28 DIAGNOSIS — I441 Atrioventricular block, second degree: Secondary | ICD-10-CM | POA: Insufficient documentation

## 2015-10-28 DIAGNOSIS — E119 Type 2 diabetes mellitus without complications: Secondary | ICD-10-CM | POA: Insufficient documentation

## 2015-10-28 DIAGNOSIS — I4729 Other ventricular tachycardia: Secondary | ICD-10-CM | POA: Insufficient documentation

## 2015-10-28 NOTE — Progress Notes (Signed)
This encounter was created in error - please disregard.

## 2015-10-28 NOTE — Progress Notes (Deleted)
Error

## 2015-10-31 NOTE — Progress Notes (Signed)
Cardiology Office Note   Date:  11/01/2015   ID:  Roger Ward, DOB 07/29/1981, MRN [id number]   Patient Care Team: Veryl Speak, FNP as PCP - General (Family Medicine) Thurmon Fair, MD as Consulting Physician (Cardiology)    Chief Complaint  Patient presents with  . Congestive Heart Failure     History of Present Illness: Roger Ward is a 34 y.o. male with a hx of obesity, HTN, tobacco abuse.  He was seen by cardiology in 3/16 during an admission for acute HF.  Echo demonstrated diff HK with EF 40% and mild LVH.  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  Patient was therefore not placed on beta-blocker therapy. It was felt that beta-blocker should be avoided.  It was suspected his bradycardia was related to severe OSA.  He was started on nitrates, Hydralazine, ACE inhibitor.  OP sleep study, Holter monitor and ETT nuclear stress test was recommended.  He has never been seen in follow-up. He has missed or canceled several appointments in this office since March.  He was seen last month by PCP and placed on Carvedilol.  Of note he was on CPAP but never had formal evaluation.  Referral was made to Pulmonology.    He returns for FU.  He is doing well.  He really denies significant DOE. He is NYHA 2.  He denies orthopnea, PND.  He is not tolerating CPAP.  He denies syncope or near syncope.  He does have a NP cough.  This has been persistent since prior to starting ACE inhibitor (not likely from ACE).  No bleeding issues. He smokes.  He does admit to a diet high in salt.     Studies/Reports Reviewed Today:  Echo 3/16 Mild LVH, EF 40%, diffuse HK, moderate MR, mild LAE, mild RVE, mildly reduced RVSF, mild RAE, PASP 45 mmHg   Past Medical History  Diagnosis Date  . Essential hypertension   . Family history of adverse reaction to anesthesia     mother and aunt  both put to sleep for surgery and "stopped  breathing"  . Asthma     Childhood  . Chronic combined systolic and diastolic CHF (congestive heart failure) (HCC)     a. echo 03/06/15 showing mild LVH, EF 40%, high ventricular filling pressures, mod MR, mildly dilated RV/mildly reduced RV function, mildly dilated RA, PASP .  . OSA (obstructive sleep apnea)     a. noncompliance with CPAP - wakes up with it off.  Marland Kitchen NSVT (nonsustained ventricular tachycardia) (HCC)     a. noted during 02/2015 admission.  . 2Nd degree AV block     a. noted during 02/2015 admission - second degree AV AV block (not further defined in notes). Not on BB due to this.  . Bradycardia     a. noted during 02/2015 admission - second degree AV AV block (not further defined in notes). Not on BB due to this.  . Diabetes mellitus, type 2 (HCC)   . Morbid obesity (HCC)     No past surgical history on file.   Current Outpatient Prescriptions  Medication Sig Dispense Refill  . BIDIL 20-37.5 MG per tablet TAKE 1 TABLET BY MOUTH 3 TIMES DAILY. 90 tablet 0  . furosemide (LASIX) 40 MG tablet TAKE 1 TABLET BY MOUTH 2 TIMES DAILY. 180 tablet 0  . lisinopril (PRINIVIL,ZESTRIL) 10 MG tablet Take 1 tablet (10 mg  total) by mouth daily. 90 tablet 3   No current facility-administered medications for this visit.    Allergies:   Penicillins    Social History:   Social History   Social History  . Marital Status: Married    Spouse Name: N/A  . Number of Children: 0  . Years of Education: 12   Occupational History  . Supervisor at [employer]    Social History Main Topics  . Smoking status: Former Smoker -- 0.25 packs/day for 19 years    Types: Cigarettes    Start date: 01/05/1996    Quit date: 03/04/2015  . Smokeless tobacco: Never Used     Comment: Already decreasaed smoking  . Alcohol Use: 0.6 - 1.2 oz/week    1-2 Standard drinks or equivalent per week     Comment: Occasional  . Drug Use: No  . Sexual Activity: Yes    Birth Control/ Protection:  Condom   Other Topics Concern  . None   Social History Narrative   Born and raised in [place]. Fun: Bowling, movies, we go   Denies religious beliefs that would effect health care.      Family History:   Family History  Problem Relation Age of Onset  . Hypertension Mother   . Hyperlipidemia Mother   . Heart failure Mother   . Multiple sclerosis Mother   . Breast cancer Maternal Grandmother   . Diabetes Maternal Grandmother   . Other Maternal Uncle     Colon Cancer      ROS:   Please see the history of present illness.   Review of Systems  Respiratory: Positive for cough, shortness of breath and snoring.   All other systems reviewed and are negative.     PHYSICAL EXAM: VS:  BP 140/60 mmHg  Pulse 105  Ht  (1.854 m)  Wt 343 lb 6.4 oz (155.765 kg)  BMI 45.32 kg/m2    Wt Readings from Last 3 Encounters:  11/01/15 343 lb 6.4 oz (155.765 kg)  10/10/15 338 lb (153.316 kg)  03/09/15 337 lb 12.8 oz (153.225 kg)     GEN: Well nourished, well developed, in no acute distress HEENT: normal Neck: I cannot assess JVD, no masses Cardiac:  Normal S1/S2, RRR; no murmur ,  no rubs or gallops, trace bilateral LE edema   Respiratory:  clear to auscultation bilaterally, no wheezing, rhonchi or rales. GI: soft, nontender, nondistended, + BS MS: no deformity or atrophy Skin: warm and dry  Neuro:  CNs II-XII intact, Strength and sensation are intact Psych: Normal affect   EKG:  EKG is ordered today.  It demonstrates:   Sinus tachycardia, HR 105, RBBB, QTC 496 ms   Recent Labs: 03/05/2015: B Natriuretic Peptide 338.9*; Hemoglobin 14.5; Platelets 346; TSH 0.568 03/06/2015: Magnesium 2.1 10/10/2015: ALT 19; BUN 13; Creatinine, Ser 1.14; Potassium 3.9; Sodium 141    Lipid Panel    Component Value Date/Time   CHOL 166 03/06/2015 0155   TRIG 248* 03/06/2015 0155   HDL 42 03/06/2015 0155   CHOLHDL 4.0 03/06/2015 0155   VLDL 50* 03/06/2015 0155   LDLCALC 74 03/06/2015  0155      ASSESSMENT AND PLAN:  1. Chronic Combined Systolic and Diastolic CHF:  Volume stable. He is NYHA 2. Avoid beta blocker given nocturnal bradycardia and AV block. We could consider initiating beta blocker once his sleep apnea is adequate treated. Continue isosorbide, hydralazine, ACE inhibitor. Consider changing ACE inhibitor to [facility] as well as  initiating spironolactone.  2. Dilated Cardiomyopathy:  Probably related to HTN.  Given his size, nuclear stress test would likely not yield useful information. We'll plan on repeating his echocardiogram once his heart failure medications are maximized and his sleep apnea is treated. If his EF is not normal at that point, consider cardiac catheterization versus cardiac CTA.  3. Hypertensive Heart Disease:  Continue current therapy. We discussed the importance of limiting salt.  4. Bradycardia:  Patient with tachycardia and bradycardia, NSVT, 2nd degree AVB in the hospital.  He has evidence of conduction system disease with RBBB.  Holter monitor was to be done but patient has never FU in the office.  Beta blocker was initiated by primary care. However, patient never started taking this medication. At this point, I have recommended that the patient not take carvedilol. Once his sleep apnea is adequately treated, we will obtain a Holter monitor to reassess for evidence of AV block.  5. OSA:  Obtain split-night sleep study. He may have an element of OHS.  6. Diabetes Mellitus:  Managed by PCP.    7. Tobacco Abuse: We discussed the importance of cessation.  8. Obesity:  As noted he probably has OSA/OHS.  We discussed the importance of weight loss.       Medication Changes: Current medicines are reviewed at length with the patient today.  Concerns regarding medicines are as outlined above.  The following changes have been made:   Discontinued Medications   CARVEDILOL (COREG) 6.25 MG TABLET    Take 1 tablet (6.25 mg total) by mouth 2 (two)  times daily with a meal.   LISINOPRIL (PRINIVIL,ZESTRIL) 5 MG TABLET    TAKE 1 TABLET BY MOUTH DAILY.   Modified Medications   No medications on file   New Prescriptions   LISINOPRIL (PRINIVIL,ZESTRIL) 10 MG TABLET    Take 1 tablet (10 mg total) by mouth daily.   Labs/ tests ordered today include:   Orders Placed This Encounter  Procedures  . Basic Metabolic Panel (BMET)  . EKG 12-Lead  . Split night study     Disposition:    FU with me in 2-3 week and Dr. Rachelle Hora Croitoru in 8 weeks.     Signed, Brynda Rim, MHS 11/01/2015 4:36 PM    [facility] [address] [place], [place], [place]  [address] Phone: [phone]; Fax: [phone]

## 2015-11-01 ENCOUNTER — Ambulatory Visit (INDEPENDENT_AMBULATORY_CARE_PROVIDER_SITE_OTHER): Payer: 59 | Admitting: Physician Assistant

## 2015-11-01 ENCOUNTER — Encounter: Payer: Self-pay | Admitting: Physician Assistant

## 2015-11-01 ENCOUNTER — Encounter: Payer: Self-pay | Admitting: *Deleted

## 2015-11-01 VITALS — BP 140/60 | HR 105 | Ht 73.0 in | Wt 343.4 lb

## 2015-11-01 DIAGNOSIS — I420 Dilated cardiomyopathy: Secondary | ICD-10-CM

## 2015-11-01 DIAGNOSIS — I5042 Chronic combined systolic (congestive) and diastolic (congestive) heart failure: Secondary | ICD-10-CM

## 2015-11-01 DIAGNOSIS — Z720 Tobacco use: Secondary | ICD-10-CM

## 2015-11-01 DIAGNOSIS — I110 Hypertensive heart disease with heart failure: Secondary | ICD-10-CM

## 2015-11-01 DIAGNOSIS — R001 Bradycardia, unspecified: Secondary | ICD-10-CM

## 2015-11-01 DIAGNOSIS — I441 Atrioventricular block, second degree: Secondary | ICD-10-CM

## 2015-11-01 DIAGNOSIS — G4733 Obstructive sleep apnea (adult) (pediatric): Secondary | ICD-10-CM | POA: Diagnosis not present

## 2015-11-01 DIAGNOSIS — G4730 Sleep apnea, unspecified: Secondary | ICD-10-CM

## 2015-11-01 MED ORDER — LISINOPRIL 10 MG PO TABS: 10.0000 mg | ORAL_TABLET | Freq: Every day | ORAL | Status: DC

## 2015-11-01 NOTE — Patient Instructions (Signed)
Medication Instructions:  1) DO NOT take Coreg 2) INCREASE Lisinopril to 10mg  once daily  Labwork: Your physician recommends that you return for lab work in: 1 week (BMET)  Testing/Procedures: Your physician has recommended that you have a split night sleep study. This test records several body functions during sleep, including: brain activity, eye movement, oxygen and carbon dioxide blood levels, heart rate and rhythm, breathing rate and rhythm, the flow of air through your mouth and nose, snoring, body muscle movements, and chest and belly movement.   Follow-Up: Your physician recommends that you schedule a follow-up appointment in: 2-3 weeks with Tereso Newcomer, PA-C.  Your physician recommends that you schedule a follow-up appointment in: 8 weeks with Dr. Royann Shivers.   Any Other Special Instructions Will Be Listed Below (If Applicable).     If you need a refill on your cardiac medications before your next appointment, please call your pharmacy.

## 2015-11-08 ENCOUNTER — Other Ambulatory Visit (INDEPENDENT_AMBULATORY_CARE_PROVIDER_SITE_OTHER): Payer: 59

## 2015-11-08 DIAGNOSIS — I5042 Chronic combined systolic (congestive) and diastolic (congestive) heart failure: Secondary | ICD-10-CM

## 2015-11-08 LAB — BASIC METABOLIC PANEL
BUN: 13 mg/dL (ref 7–25)
CHLORIDE: 102 mmol/L (ref 98–110)
CO2: 26 mmol/L (ref 20–31)
Calcium: 9 mg/dL (ref 8.6–10.3)
Creat: 1.19 mg/dL (ref 0.60–1.35)
Glucose, Bld: 96 mg/dL (ref 65–99)
POTASSIUM: 3.9 mmol/L (ref 3.5–5.3)
Sodium: 140 mmol/L (ref 135–146)

## 2015-11-14 ENCOUNTER — Telehealth: Payer: Self-pay | Admitting: *Deleted

## 2015-11-14 NOTE — Telephone Encounter (Signed)
DPR on file for pt's wife who has been notified of lab results by phone with verbal understanding.

## 2015-11-20 NOTE — Progress Notes (Signed)
Cardiology Office Note   Date:  11/20/2015   ID:  [name], DOB 1981-10-23, MRN [id number]   Patient Care Team: Veryl Speak, FNP as PCP - General (Family Medicine) Thurmon Fair, MD as Consulting Physician (Cardiology)    Chief Complaint  Patient presents with  . Follow-up  . Congestive Heart Failure     History of Present Illness: Roger Ward is a 34 y.o. male with a hx of obesity, HTN, tobacco abuse.  He was seen by cardiology in 3/16 during an admission for acute HF.  Echo demonstrated diff HK with EF 40% and mild LVH.  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  Patient was therefore not placed on beta-blocker therapy. It was felt that beta-blocker should be avoided.  It was suspected his bradycardia was related to severe OSA.  He was started on nitrates, Hydralazine, ACE inhibitor.  OP sleep study, Holter monitor and ETT nuclear stress test was recommended.  He has never been seen in follow-up. He has missed or canceled several appointments in this office since March.  He was seen last month by PCP and placed on Carvedilol.  Of note he was on CPAP but never had formal evaluation.  Referral was made to Pulmonology.    He returns for FU.  He is doing well.  He really denies significant DOE. He is NYHA 2.  He denies orthopnea, PND.  He is not tolerating CPAP.  He denies syncope or near syncope.  He does have a NP cough.  This has been persistent since prior to starting ACE inhibitor (not likely from ACE).  No bleeding issues. He smokes.  He does admit to a diet high in salt.     Studies/Reports Reviewed Today:  Echo 3/16 Mild LVH, EF 40%, diffuse HK, moderate MR, mild LAE, mild RVE, mildly reduced RVSF, mild RAE, PASP 45 mmHg   Past Medical History  Diagnosis Date  . Essential hypertension   . Family history of adverse reaction to anesthesia     mother and aunt  both put to sleep for surgery  and "stopped breathing"  . Asthma     Childhood  . Chronic combined systolic and diastolic CHF (congestive heart failure) (HCC)     a. echo 03/06/15 showing mild LVH, EF 40%, high ventricular filling pressures, mod MR, mildly dilated RV/mildly reduced RV function, mildly dilated RA, PASP .  . OSA (obstructive sleep apnea)     a. noncompliance with CPAP - wakes up with it off.  Marland Kitchen NSVT (nonsustained ventricular tachycardia) (HCC)     a. noted during 02/2015 admission.  . 2Nd degree AV block     a. noted during 02/2015 admission - second degree AV AV block (not further defined in notes). Not on BB due to this.  . Bradycardia     a. noted during 02/2015 admission - second degree AV AV block (not further defined in notes). Not on BB due to this.  . Diabetes mellitus, type 2 (HCC)   . Morbid obesity (HCC)     No past surgical history on file.   Current Outpatient Prescriptions  Medication Sig Dispense Refill  . BIDIL 20-37.5 MG per tablet TAKE 1 TABLET BY MOUTH 3 TIMES DAILY. 90 tablet 0  . furosemide (LASIX) 40 MG tablet TAKE 1 TABLET BY MOUTH 2 TIMES DAILY. 180 tablet 0  . lisinopril (PRINIVIL,ZESTRIL) 10 MG tablet Take 1  tablet (10 mg total) by mouth daily. 90 tablet 3   No current facility-administered medications for this visit.    Allergies:   Penicillins    Social History:   Social History   Social History  . Marital Status: Married    Spouse Name: N/A  . Number of Children: 0  . Years of Education: 12   Occupational History  . Supervisor at [employer]    Social History Main Topics  . Smoking status: Former Smoker -- 0.25 packs/day for 19 years    Types: Cigarettes    Start date: 01/05/1996    Quit date: 03/04/2015  . Smokeless tobacco: Never Used     Comment: Already decreasaed smoking  . Alcohol Use: 0.6 - 1.2 oz/week    1-2 Standard drinks or equivalent per week     Comment: Occasional  . Drug Use: No  . Sexual Activity: Yes    Birth Control/  Protection: Condom   Other Topics Concern  . Not on file   Social History Narrative   Born and raised in [place]. Fun: Bowling, movies, we go   Denies religious beliefs that would effect health care.      Family History:   Family History  Problem Relation Age of Onset  . Hypertension Mother   . Hyperlipidemia Mother   . Heart failure Mother   . Multiple sclerosis Mother   . Breast cancer Maternal Grandmother   . Diabetes Maternal Grandmother   . Other Maternal Uncle     Colon Cancer      ROS:   Please see the history of present illness.   Review of Systems  All other systems reviewed and are negative.     PHYSICAL EXAM: VS:  There were no vitals taken for this visit.    Wt Readings from Last 3 Encounters:  11/01/15 343 lb 6.4 oz (155.765 kg)  10/10/15 338 lb (153.316 kg)  03/09/15 337 lb 12.8 oz (153.225 kg)     GEN: Well nourished, well developed, in no acute distress HEENT: normal Neck: I cannot assess JVD, no masses Cardiac:  Normal S1/S2, RRR; no murmur ,  no rubs or gallops, trace bilateral LE edema   Respiratory:  clear to auscultation bilaterally, no wheezing, rhonchi or rales. GI: soft, nontender, nondistended, + BS MS: no deformity or atrophy Skin: warm and dry  Neuro:  CNs II-XII intact, Strength and sensation are intact Psych: Normal affect   EKG:  EKG is ordered today.  It demonstrates:   Sinus tachycardia, HR 105, RBBB, QTC 496 ms   Recent Labs: 03/05/2015: B Natriuretic Peptide 338.9*; Hemoglobin 14.5; Platelets 346; TSH 0.568 03/06/2015: Magnesium 2.1 10/10/2015: ALT 19 11/08/2015: BUN 13; Creat 1.19; Potassium 3.9; Sodium 140    Lipid Panel    Component Value Date/Time   CHOL 166 03/06/2015 0155   TRIG 248* 03/06/2015 0155   HDL 42 03/06/2015 0155   CHOLHDL 4.0 03/06/2015 0155   VLDL 50* 03/06/2015 0155   LDLCALC 74 03/06/2015 0155      ASSESSMENT AND PLAN:  1. Chronic Combined Systolic and Diastolic CHF:  Volume stable. He  is NYHA 2. Avoid beta blocker given nocturnal bradycardia and AV block. We could consider initiating beta blocker once his sleep apnea is adequate treated. Continue isosorbide, hydralazine, ACE inhibitor. Consider changing ACE inhibitor to [facility] as well as initiating spironolactone.  2. Dilated Cardiomyopathy:  Probably related to HTN.  Given his size, nuclear stress test would likely not yield  useful information. We'll plan on repeating his echocardiogram once his heart failure medications are maximized and his sleep apnea is treated. If his EF is not normal at that point, consider cardiac catheterization versus cardiac CTA.  3. Hypertensive Heart Disease:  Continue current therapy. We discussed the importance of limiting salt.  4. Bradycardia:  Patient with tachycardia and bradycardia, NSVT, 2nd degree AVB in the hospital.  He has evidence of conduction system disease with RBBB.  Holter monitor was to be done but patient has never FU in the office.  Beta blocker was initiated by primary care. However, patient never started taking this medication. At this point, I have recommended that the patient not take carvedilol. Once his sleep apnea is adequately treated, we will obtain a Holter monitor to reassess for evidence of AV block.  5. OSA:  Obtain split-night sleep study. He may have an element of OHS.  6. Diabetes Mellitus:  Managed by PCP.    7. Tobacco Abuse: We discussed the importance of cessation.  8. Obesity:  As noted he probably has OSA/OHS.  We discussed the importance of weight loss.       Medication Changes: Current medicines are reviewed at length with the patient today.  Concerns regarding medicines are as outlined above.  The following changes have been made:   Discontinued Medications   No medications on file   Modified Medications   No medications on file   New Prescriptions   No medications on file   Labs/ tests ordered today include:   No orders of the defined  types were placed in this encounter.     Disposition:    FU with me in 2-3 week and Dr. Rachelle Hora Croitoru in 8 weeks.     Signed, Brynda Rim, MHS 11/20/2015 9:15 PM    [facility] [address] [place], [place], [place]  [address] Phone: [phone]; Fax: [phone]    This encounter was created in error - please disregard.

## 2015-11-21 ENCOUNTER — Encounter: Payer: 59 | Admitting: Physician Assistant

## 2015-11-21 ENCOUNTER — Other Ambulatory Visit: Payer: Self-pay | Admitting: Family

## 2015-11-28 ENCOUNTER — Other Ambulatory Visit: Payer: Self-pay | Admitting: Family

## 2015-12-20 NOTE — Progress Notes (Signed)
Cardiology Office Note    Date:  12/20/2015   ID:  Roger Ward, DOB 1981/04/04, MRN [id number]  PCP:  Jeanine Luz, FNP  Cardiologist:  Dr. Thurmon Fair   Electrophysiologist:  n/a  Chief Complaint  Patient presents with  . Follow-up  . Congestive Heart Failure    History of Present Illness:  Roger Ward is a 35 y.o. male with a hx of obesity, HTN, tobacco abuse. He was seen by cardiology in 3/16 during an admission for acute HF. Echo demonstrated diff HK with EF 40% and mild LVH. 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 Patient was therefore not placed on beta-blocker therapy. It was felt that beta-blocker should be avoided. It was suspected his bradycardia was related to severe OSA. He was started on nitrates, Hydralazine, ACE inhibitor. OP sleep study, Holter monitor and ETT nuclear stress test was recommended. He has never been seen in follow-up. He has missed or canceled several appointments in this office since March.  He was seen last month by PCP and placed on Carvedilol. Of note he was on CPAP but never had formal evaluation. Referral was made to Pulmonology.   He returns for FU. He is doing well. He really denies significant DOE. He is NYHA 2. He denies orthopnea, PND. He is not tolerating CPAP. He denies syncope or near syncope. He does have a NP cough. This has been persistent since prior to starting ACE inhibitor (not likely from ACE). No bleeding issues. He smokes. He does admit to a diet high in salt.     Past Medical History  Diagnosis Date  . Essential hypertension   . Family history of adverse reaction to anesthesia     mother and aunt  both put to sleep for surgery and "stopped breathing"  . Asthma     Childhood  . Chronic combined systolic and diastolic CHF (congestive heart failure) (HCC)     a. echo 03/06/15 showing mild LVH, EF 40%, high ventricular filling  pressures, mod MR, mildly dilated RV/mildly reduced RV function, mildly dilated RA, PASP .  . OSA (obstructive sleep apnea)     a. noncompliance with CPAP - wakes up with it off.  Marland Kitchen NSVT (nonsustained ventricular tachycardia) (HCC)     a. noted during 02/2015 admission.  . 2Nd degree AV block     a. noted during 02/2015 admission - second degree AV AV block (not further defined in notes). Not on BB due to this.  . Bradycardia     a. noted during 02/2015 admission - second degree AV AV block (not further defined in notes). Not on BB due to this.  . Diabetes mellitus, type 2 (HCC)   . Morbid obesity [facility])     Past Surgical History  Procedure Laterality Date  . No past surgeries      Current Outpatient Prescriptions  Medication Sig Dispense Refill  . BIDIL 20-37.5 MG per tablet TAKE 1 TABLET BY MOUTH 3 TIMES DAILY. 90 tablet 0  . furosemide (LASIX) 40 MG tablet TAKE 1 TABLET BY MOUTH 2 TIMES DAILY. 180 tablet 0  . lisinopril (PRINIVIL,ZESTRIL) 10 MG tablet Take 1 tablet (10 mg total) by mouth daily. 90 tablet 3  . lisinopril (PRINIVIL,ZESTRIL) 5 MG tablet TAKE 1 TABLET BY MOUTH DAILY. 90 tablet 0   No current facility-administered medications for this visit.    Allergies:   Penicillins   Social  History   Social History  . Marital Status: Married    Spouse Name: N/A  . Number of Children: 0  . Years of Education: 12   Occupational History  . Supervisor at [employer]    Social History Main Topics  . Smoking status: Former Smoker -- 0.25 packs/day for 19 years    Types: Cigarettes    Start date: 01/05/1996    Quit date: 03/04/2015  . Smokeless tobacco: Never Used     Comment: Already decreasaed smoking  . Alcohol Use: 0.6 - 1.2 oz/week    1-2 Standard drinks or equivalent per week     Comment: Occasional  . Drug Use: No  . Sexual Activity: Yes    Birth Control/ Protection: Condom   Other Topics Concern  . Not on file   Social History Narrative   Born  and raised in [place]. Fun: Bowling, movies, we go   Denies religious beliefs that would effect health care.      Family History:  The patient's family history includes Breast cancer in his maternal grandmother; Diabetes in his maternal grandmother; Heart failure in his mother; Hyperlipidemia in his mother; Hypertension in his mother; Multiple sclerosis in his mother; Other in his maternal uncle.   ROS:   Please see the history of present illness.    ROS All other systems reviewed and are negative.   PHYSICAL EXAM:   VS:  There were no vitals taken for this visit.   GEN: Well nourished, well developed, in no acute distress HEENT: normal Neck: no JVD, no masses Cardiac: Normal S1/S2, RRR; no murmurs, rubs, or gallops, no edema;   carotid bruits,   Respiratory:  clear to auscultation bilaterally; no wheezing, rhonchi or rales GI: soft, nontender, nondistended, + BS MS: no deformity or atrophy Skin: warm and dry, no rash Neuro:  Bilateral strength equal, no focal deficits  Psych: Alert and oriented x 3, normal affect  Wt Readings from Last 3 Encounters:  11/01/15 343 lb 6.4 oz (155.765 kg)  10/10/15 338 lb (153.316 kg)  03/09/15 337 lb 12.8 oz (153.225 kg)      Studies/Labs Reviewed:   EKG:  EKG is  ordered today.  The ekg ordered today demonstrates   Recent Labs: 03/05/2015: B Natriuretic Peptide 338.9*; Hemoglobin 14.5; Platelets 346; TSH 0.568 03/06/2015: Magnesium 2.1 10/10/2015: ALT 19 11/08/2015: BUN 13; Creat 1.19; Potassium 3.9; Sodium 140   Recent Lipid Panel    Component Value Date/Time   CHOL 166 03/06/2015 0155   TRIG 248* 03/06/2015 0155   HDL 42 03/06/2015 0155   CHOLHDL 4.0 03/06/2015 0155   VLDL 50* 03/06/2015 0155   LDLCALC 74 03/06/2015 0155    Additional studies/ records that were reviewed today include:   Echo 3/16 Mild LVH, EF 40%, diffuse HK, moderate MR, mild LAE, mild RVE, mildly reduced RVSF, mild RAE, PASP 45 mmHg   ASSESSMENT:     No diagnosis found.  PLAN:  In order of problems listed above:  1. Chronic Combined Systolic and Diastolic CHF: Volume stable. He is NYHA 2. Avoid beta blocker given nocturnal bradycardia and AV block. We could consider initiating beta blocker once his sleep apnea is adequate treated. Continue isosorbide, hydralazine, ACE inhibitor. Consider changing ACE inhibitor to [facility] as well as initiating spironolactone.  2. Dilated Cardiomyopathy: Probably related to HTN. Given his size, nuclear stress test would likely not yield useful information. We'll plan on repeating his echocardiogram once his heart failure medications are maximized  and his sleep apnea is treated. If his EF is not normal at that point, consider cardiac catheterization versus cardiac CTA.  3. Hypertensive Heart Disease: Continue current therapy. We discussed the importance of limiting salt.  4. Bradycardia: Patient with tachycardia and bradycardia, NSVT, 2nd degree AVB in the hospital. He has evidence of conduction system disease with RBBB. Holter monitor was to be done but patient has never FU in the office. Beta blocker was initiated by primary care. However, patient never started taking this medication. At this point, I have recommended that the patient not take carvedilol. Once his sleep apnea is adequately treated, we will obtain a Holter monitor to reassess for evidence of AV block.  5. OSA: Obtain split-night sleep study. He may have an element of OHS.  6. Diabetes Mellitus: Managed by PCP.   7. Tobacco Abuse: We discussed the importance of cessation.  8. Obesity: As noted he probably has OSA/OHS. We discussed the importance of weight loss.     Medication Adjustments/Labs and Tests Ordered: Current medicines are reviewed at length with the patient today.  Concerns regarding medicines are outlined above.  Medication changes, Labs and Tests ordered today are listed below. There are no Patient  Instructions on file for this visit.    Signed, Tereso Newcomer, PA-C  12/20/2015 8:30 AM    [facility] [address] [place], [place], [place]  [address] Phone: [phone]; Fax: [phone]  This encounter was created in error - please disregard.

## 2015-12-21 ENCOUNTER — Encounter: Payer: 59 | Admitting: Physician Assistant

## 2015-12-28 ENCOUNTER — Encounter: Payer: Self-pay | Admitting: Physician Assistant

## 2016-01-09 ENCOUNTER — Encounter: Payer: Self-pay | Admitting: Cardiovascular Disease

## 2016-01-09 ENCOUNTER — Ambulatory Visit (INDEPENDENT_AMBULATORY_CARE_PROVIDER_SITE_OTHER): Payer: 59 | Admitting: Cardiovascular Disease

## 2016-01-09 VITALS — BP 170/106 | HR 100 | Ht 72.0 in | Wt 375.2 lb

## 2016-01-09 DIAGNOSIS — G4733 Obstructive sleep apnea (adult) (pediatric): Secondary | ICD-10-CM

## 2016-01-09 DIAGNOSIS — I441 Atrioventricular block, second degree: Secondary | ICD-10-CM | POA: Diagnosis not present

## 2016-01-09 DIAGNOSIS — I10 Essential (primary) hypertension: Secondary | ICD-10-CM | POA: Diagnosis not present

## 2016-01-09 DIAGNOSIS — Z79899 Other long term (current) drug therapy: Secondary | ICD-10-CM

## 2016-01-09 DIAGNOSIS — I5042 Chronic combined systolic (congestive) and diastolic (congestive) heart failure: Secondary | ICD-10-CM

## 2016-01-09 DIAGNOSIS — R0600 Dyspnea, unspecified: Secondary | ICD-10-CM

## 2016-01-09 MED ORDER — FUROSEMIDE 80 MG PO TABS: 80.0000 mg | ORAL_TABLET | Freq: Two times a day (BID) | ORAL | Status: DC

## 2016-01-09 MED ORDER — METOLAZONE 2.5 MG PO TABS: 2.5000 mg | ORAL_TABLET | ORAL | Status: DC

## 2016-01-09 MED ORDER — LISINOPRIL 20 MG PO TABS: 20.0000 mg | ORAL_TABLET | Freq: Every day | ORAL | Status: DC

## 2016-01-09 MED ORDER — POTASSIUM CHLORIDE CRYS ER 20 MEQ PO TBCR: 20.0000 meq | EXTENDED_RELEASE_TABLET | Freq: Two times a day (BID) | ORAL | Status: DC

## 2016-01-09 NOTE — Progress Notes (Signed)
Patient ID: Roger Ward, male   DOB: 28-Dec-1980, 35 y.o.   MRN: [id number]    Cardiology Office Note    Date:  01/10/2016   ID:  Roger Ward, DOB Dec 11, 1981, MRN [id number]  PCP:  Jeanine Luz, FNP  Cardiologist:   Thurmon Fair, MD   Chief Complaint  Patient presents with  . Follow-up    8 week//CHF//pt c/o SOB on exertion, and swelling in bilateral legs/feet/ankles//pt states the Lisinopril is making him swell; and he is having cramps in his legs, abdomen and other places.    History of Present Illness:  Roger Ward is a 35 y.o. 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 We did not see him in follow-up between March and November when he had an appointment with Tereso Newcomer. He has actually gained even more fluid since then and has had spotty compliance with sodium restriction and medications. Lisinopril was started at his last appointment and he thinks this may be contributing to the swelling. He has NYHA class III exertional dyspnea and massive lower extremity edema. He has orthopnea and sleeps sitting up. He denies chest pain and has not aspirin syncope or palpitations. He has yet to have his official sleep study, but tells me that this is scheduled for January 29.  Past Medical History  Diagnosis Date  . Essential hypertension   . Family history of adverse reaction to anesthesia     mother and aunt  both put to sleep for surgery and "stopped breathing"  . Asthma     Childhood  . Chronic combined systolic and diastolic CHF (congestive heart failure) (HCC)     a. echo 03/06/15 showing mild LVH, EF 40%, high ventricular filling pressures, mod MR, mildly dilated RV/mildly reduced RV function, mildly dilated RA, PASP .  . OSA (obstructive sleep apnea)     a.  noncompliance with CPAP - wakes up with it off.  Marland Kitchen NSVT (nonsustained ventricular tachycardia) (HCC)     a. noted during 02/2015 admission.  . 2Nd degree AV block     a. noted during 02/2015 admission - second degree AV AV block (not further defined in notes). Not on BB due to this.  . Bradycardia     a. noted during 02/2015 admission - second degree AV AV block (not further defined in notes). Not on BB due to this.  . Diabetes mellitus, type 2 (HCC)   . Morbid obesity [facility])     Past Surgical History  Procedure Laterality Date  . No past surgeries      Outpatient Prescriptions Prior to Visit  Medication Sig Dispense Refill  . BIDIL 20-37.5 MG per tablet TAKE 1 TABLET BY MOUTH 3 TIMES DAILY. 90 tablet 0  . furosemide (LASIX) 40 MG tablet TAKE 1 TABLET BY MOUTH 2 TIMES DAILY. 180 tablet 0  . lisinopril (PRINIVIL,ZESTRIL) 5 MG tablet TAKE 1 TABLET BY MOUTH DAILY. (Patient taking differently: Take 2 tablets daily.) 90 tablet 0  . lisinopril (PRINIVIL,ZESTRIL) 10 MG tablet Take 1 tablet (10 mg total) by mouth daily. 90 tablet 3   No facility-administered medications prior to visit.     Allergies:   Penicillins   Social History   Social History  . Marital Status: Married    Spouse Name: N/A  . Number of Children: 0  . Years of  Education: 12   Occupational History  . Supervisor at [employer]    Social History Main Topics  . Smoking status: Former Smoker -- 0.25 packs/day for 19 years    Types: Cigarettes    Start date: 01/05/1996    Quit date: 03/04/2015  . Smokeless tobacco: Never Used     Comment: Already decreasaed smoking  . Alcohol Use: 0.6 - 1.2 oz/week    1-2 Standard drinks or equivalent per week     Comment: Occasional  . Drug Use: No  . Sexual Activity: Yes    Birth Control/ Protection: Condom   Other Topics Concern  . None   Social History Narrative   Born and raised in [place]. Fun: Bowling, movies, we go   Denies religious beliefs that would  effect health care.      Family History:  The patient's family history includes Breast cancer in his maternal grandmother; Diabetes in his maternal grandmother; Heart failure in his mother; Hyperlipidemia in his mother; Hypertension in his mother; Multiple sclerosis in his mother; Other in his maternal uncle.   ROS:   Please see the history of present illness.    ROS All other systems reviewed and are negative.   PHYSICAL EXAM:   VS:  BP 170/106 mmHg  Pulse 100  Ht 6' (1.829 m)  Wt 375 lb 3.2 oz (170.19 kg)  BMI 50.88 kg/m2   GEN: Well nourished, well developed, in no acute distress. Obesity severely limits the physical exam HEENT: normal Neck: unable to see his JVD,know  carotid bruits, or masses Cardiac: RRR; no murmurs, rubs, or gallops,unable to locate the apical impulse, 3+ pitting edema of the lower extremities to the knees bilaterally  Respiratory:  clear to auscultation bilaterally, normal work of breathing GI: obesity limits the exam, unable to palpate his liver, soft, nontender, nondistended, + BS MS: no deformity or atrophy Skin: warm and dry, no rash Neuro:  Alert and Oriented x 3, Strength and sensation are intact Psych: euthymic mood, full affect  Wt Readings from Last 3 Encounters:  01/09/16 375 lb 3.2 oz (170.19 kg)  11/01/15 343 lb 6.4 oz (155.765 kg)  10/10/15 338 lb (153.316 kg)      Studies/Labs Reviewed:   EKG:  EKG is not ordered today.   Recent Labs: 03/05/2015: B Natriuretic Peptide 338.9*; Hemoglobin 14.5; Platelets 346; TSH 0.568 03/06/2015: Magnesium 2.1 10/10/2015: ALT 19 11/08/2015: BUN 13; Creat 1.19; Potassium 3.9; Sodium 140   Lipid Panel    Component Value Date/Time   CHOL 166 03/06/2015 0155   TRIG 248* 03/06/2015 0155   HDL 42 03/06/2015 0155   CHOLHDL 4.0 03/06/2015 0155   VLDL 50* 03/06/2015 0155   LDLCALC 74 03/06/2015 0155    ASSESSMENT:    1. Chronic combined systolic and diastolic CHF (congestive heart failure) (HCC)     2. Medication management   3. Essential hypertension, malignant   4. Second degree AV block, Mobitz type I   5. OSA (obstructive sleep apnea)   6. Morbid obesity, unspecified obesity type (HCC)   7. Dyspnea      PLAN:  In order of problems listed above:  1. CHF: He is clearly severely hypervolemic. He has gained almost 23 pounds since November and 37 pounds since last October, when he was probably the closest we have ever got into his dry weight at 338 pounds. Will increase his diuretic dose.  He has predominantly right heart failure symptoms, quite probably due to obesity and  untreated obstructive sleep apnea. He also has left ventricular failure due to malignant hypertension. Discussed sodium restriction and weight monitoring again.  Discussed the fact that we would be best able to get rid of the extra fluid with hospitalization and intravenous diuretics, but he would like to avoid going to the hospital.  2. We'll continue to try to titrate his heart failure medication. Obviously, he needs to take these regularly for them to be helpful. Increase lisinopril to 20 mg daily. Add Zaroxolyn today, for 1 dose only. Repeat labs on Thursday and see how much weight he has lost at that time, before we decide long-term use of thiazide in addition to loop diuretic. He will need early follow-up  Investigate options for treatment with Entresto depending on his insurance coverage. 3. Malignant hypertension: Blood pressure control will be critical to treatment of his hypertension and reversal of his cardiomyopathy. Better diuresis should lead to some improvement in his blood pressure is well  4. Second-degree AV block, Mobitz type I :Avoiding beta blockers at this time due to bradycardia and second-degree Mobitz type I AV block noted during his previous hospitalization. These were likely related to obstructive sleep apnea. Once this is treated we will revisit the option to use beta blockers. 5. OSA: Sleep  study scheduled for end of January  6. (Super) Morbid obesity:  in the long run, aggressive weight loss will be very important for health and longevity.    Medication Adjustments/Labs and Tests Ordered: Current medicines are reviewed at length with the patient today.  Concerns regarding medicines are outlined above.  Medication changes, Labs and Tests ordered today are listed in the Patient Instructions below. Patient Instructions  Your physician has recommended you make the following change in your medication:   Increase furosemide to 80 mg twice daily  Increase lisinopril to 20 mg daily  Start potassium 20 meq twice daily  Start metolazone 2.5 mg - take 30 minutes prior to the furosemide tomorrow only, then as directed  Your physician recommends that you return for lab work in: Thursday at [place] lab - we will call you with the results Friday and we will get your weight recording at that time  Your physician recommends that you weigh, daily, at the same time every day, and in the same amount of clothing. Please record your daily weights on the handout provided and bring it to your next appointment.  Your physician recommends that you schedule a follow-up appointment in: 1-2 weeks with Tereso Newcomer, [facility] at [facility]  Dr. Royann Shivers recommends that you schedule a follow-up appointment in: first available  Daily Weight Record It is important to weigh yourself daily. Keep this daily weight chart near your scale. Weigh yourself each morning at the same time. Weigh yourself without shoes, and wear the same amount of clothing each day. Compare today's weight to yesterday's weight. Bring this form with you to your follow-up appointments. Call your health care provider if you have concerns about your weight, including rapid weight gain or rapid weight loss. Date: ________ Weight: ____________________ Date: ________ Weight: ____________________ Date: ________ Weight: ____________________ Date:  ________ Weight: ____________________ Date: ________ Weight: ____________________ Date: ________ Weight: ____________________ Date: ________ Weight: ____________________ Date: ________ Weight: ____________________ Date: ________ Weight: ____________________ Date: ________ Weight: ____________________ Date: ________ Weight: ____________________ Date: ________ Weight: ____________________ Date: ________ Weight: ____________________ Date: ________ Weight: ____________________ Date: ________ Weight: ____________________ Date: ________ Weight: ____________________ Date: ________ Weight: ____________________ Date: ________ Weight: ____________________ Date: ________ Weight: ____________________ Date:  ________ Weight: ____________________ Date: ________ Weight: ____________________ Date: ________ Weight: ____________________ Date: ________ Weight: ____________________ Date: ________ Weight: ____________________ Date: ________ Weight: ____________________ Date: ________ Weight: ____________________ Date: ________ Weight: ____________________ Date: ________ Weight: ____________________ Date: ________ Weight: ____________________ Date: ________ Weight: ____________________ Date: ________ Weight: ____________________ Date: ________ Weight: ____________________ Date: ________ Weight: ____________________ Date: ________ Weight: ____________________ Date: ________ Weight: ____________________ Date: ________ Weight: ____________________ Date: ________ Weight: ____________________ Date: ________ Weight: ____________________ Date: ________ Weight: ____________________ Date: ________ Weight: ____________________ Date: ________ Weight: ____________________ Date: ________ Weight: ____________________ Date: ________ Weight: ____________________ Date: ________ Weight: ____________________ Date: ________ Weight: ____________________ Date: ________ Weight: ____________________ Date: ________ Weight:  ____________________ Date: ________ Weight: ____________________ Date: ________ Weight: ____________________ Date: ________ Weight: ____________________   This information is not intended to replace advice given to you by your health care provider. Make sure you discuss any questions you have with your health care provider.   Document Released: 02/14/2007 Document Revised: 12/24/2014 Document Reviewed: 07/02/2014 Elsevier Interactive Patient Education 2016 [employer]  Low-Sodium Eating Plan Sodium raises blood pressure and causes water to be held in the body. Getting less sodium from food will help lower your blood pressure, reduce any swelling, and protect your heart, liver, and kidneys. We get sodium by adding salt (sodium chloride) to food. Most of our sodium comes from canned, boxed, and frozen foods. Restaurant foods, fast foods, and pizza are also very high in sodium. Even if you take medicine to lower your blood pressure or to reduce fluid in your body, getting less sodium from your food is important. WHAT IS MY PLAN? Most people should limit their sodium intake to 2,300 mg a day. Your health care provider recommends that you limit your sodium intake to _2000 mg_________ a day.  WHAT DO I NEED TO KNOW ABOUT THIS EATING PLAN? For the low-sodium eating plan, you will follow these general guidelines:  Choose foods with a % Daily Value for sodium of less than 5% (as listed on the food label).   Use salt-free seasonings or herbs instead of table salt or sea salt.   Check with your health care provider or pharmacist before using salt substitutes.   Eat fresh foods.  Eat more vegetables and fruits.  Limit canned vegetables. If you do use them, rinse them well to decrease the sodium.   Limit cheese to 1 oz (28 g) per day.   Eat lower-sodium products, often labeled as "lower sodium" or "no salt added."  Avoid foods that contain monosodium glutamate (MSG). MSG is sometimes added  to [place] food and some canned foods.  Check food labels (Nutrition Facts labels) on foods to learn how much sodium is in one serving.  Eat more home-cooked food and less restaurant, buffet, and fast food.  When eating at a restaurant, ask that your food be prepared with less salt, or no salt if possible.  HOW DO I READ FOOD LABELS FOR SODIUM INFORMATION? The Nutrition Facts label lists the amount of sodium in one serving of the food. If you eat more than one serving, you must multiply the listed amount of sodium by the number of servings. Food labels may also identify foods as:  Sodium free--Less than 5 mg in a serving.  Very low sodium--35 mg or less in a serving.  Low sodium--140 mg or less in a serving.  Light in sodium--50% less sodium in a serving. For example, if a food that usually has 300 mg of sodium is changed to become light in  sodium, it will have 150 mg of sodium.  Reduced sodium--25% less sodium in a serving. For example, if a food that usually has 400 mg of sodium is changed to reduced sodium, it will have 300 mg of sodium. WHAT FOODS CAN I EAT? Grains Low-sodium cereals, including oats, puffed wheat and rice, and shredded wheat cereals. Low-sodium crackers. Unsalted rice and pasta. Lower-sodium bread.  Vegetables Frozen or fresh vegetables. Low-sodium or reduced-sodium canned vegetables. Low-sodium or reduced-sodium tomato sauce and paste. Low-sodium or reduced-sodium tomato and vegetable juices.  Fruits Fresh, frozen, and canned fruit. Fruit juice.  Meat and Other Protein Products Low-sodium canned tuna and salmon. Fresh or frozen meat, poultry, seafood, and fish. Lamb. Unsalted nuts. Dried beans, peas, and lentils without added salt. Unsalted canned beans. Homemade soups without salt. Eggs.  Dairy Milk. Soy milk. Ricotta cheese. Low-sodium or reduced-sodium cheeses. Yogurt.  Condiments Fresh and dried herbs and spices. Salt-free seasonings. Onion and  garlic powders. Low-sodium varieties of mustard and ketchup. Fresh or refrigerated horseradish. Lemon juice.  Fats and Oils Reduced-sodium salad dressings. Unsalted butter.  Other Unsalted popcorn and pretzels.  The items listed above may not be a complete list of recommended foods or beverages. Contact your dietitian for more options. WHAT FOODS ARE NOT RECOMMENDED? Grains Instant hot cereals. Bread stuffing, pancake, and biscuit mixes. Croutons. Seasoned rice or pasta mixes. Noodle soup cups. Boxed or frozen macaroni and cheese. Self-rising flour. Regular salted crackers. Vegetables Regular canned vegetables. Regular canned tomato sauce and paste. Regular tomato and vegetable juices. Frozen vegetables in sauces. Salted [place] fries. Olives. Rosita Fire. Relishes. Sauerkraut. Salsa. Meat and Other Protein Products Salted, canned, smoked, spiced, or pickled meats, seafood, or fish. Bacon, ham, sausage, hot dogs, corned beef, chipped beef, and packaged luncheon meats. Salt pork. Jerky. Pickled herring. Anchovies, regular canned tuna, and sardines. Salted nuts. Dairy Processed cheese and cheese spreads. Cheese curds. Blue cheese and cottage cheese. Buttermilk.  Condiments Onion and garlic salt, seasoned salt, table salt, and sea salt. Canned and packaged gravies. Worcestershire sauce. Tartar sauce. Barbecue sauce. Teriyaki sauce. Soy sauce, including reduced sodium. Steak sauce. Fish sauce. Oyster sauce. Cocktail sauce. Horseradish that you find on the shelf. Regular ketchup and mustard. Meat flavorings and tenderizers. Bouillon cubes. Hot sauce. Tabasco sauce. Marinades. Taco seasonings. Relishes. Fats and Oils Regular salad dressings. Salted butter. Margarine. Ghee. Bacon fat.  Other Potato and tortilla chips. Corn chips and puffs. Salted popcorn and pretzels. Canned or dried soups. Pizza. Frozen entrees and pot pies.  The items listed above may not be a complete list of foods and  beverages to avoid. Contact your dietitian for more information.   This information is not intended to replace advice given to you by your health care provider. Make sure you discuss any questions you have with your health care provider.   Document Released: 05/25/2002 Document Revised: 12/24/2014 Document Reviewed: 10/07/2013 Elsevier Interactive Patient Education [address]            Signed, Thurmon Fair, MD  01/10/2016 5:15 PM    [facility] [address] [place], [place], [place]  [address] Phone: [phone]; Fax: [phone]

## 2016-01-09 NOTE — Patient Instructions (Signed)
Your physician has recommended you make the following change in your medication:   Increase furosemide to 80 mg twice daily  Increase lisinopril to 20 mg daily  Start potassium 20 meq twice daily  Start metolazone 2.5 mg - take 30 minutes prior to the furosemide tomorrow only, then as directed  Your physician recommends that you return for lab work in: Thursday at [place] lab - we will call you with the results Friday and we will get your weight recording at that time  Your physician recommends that you weigh, daily, at the same time every day, and in the same amount of clothing. Please record your daily weights on the handout provided and bring it to your next appointment.  Your physician recommends that you schedule a follow-up appointment in: 1-2 weeks with Tereso Newcomer, PA at [facility]  Dr. Royann Shivers recommends that you schedule a follow-up appointment in: first available  Daily Weight Record It is important to weigh yourself daily. Keep this daily weight chart near your scale. Weigh yourself each morning at the same time. Weigh yourself without shoes, and wear the same amount of clothing each day. Compare today's weight to yesterday's weight. Bring this form with you to your follow-up appointments. Call your health care provider if you have concerns about your weight, including rapid weight gain or rapid weight loss. Date: ________ Weight: ____________________ Date: ________ Weight: ____________________ Date: ________ Weight: ____________________ Date: ________ Weight: ____________________ Date: ________ Weight: ____________________ Date: ________ Weight: ____________________ Date: ________ Weight: ____________________ Date: ________ Weight: ____________________ Date: ________ Weight: ____________________ Date: ________ Weight: ____________________ Date: ________ Weight: ____________________ Date: ________ Weight: ____________________ Date: ________ Weight: ____________________  Date: ________ Weight: ____________________ Date: ________ Weight: ____________________ Date: ________ Weight: ____________________ Date: ________ Weight: ____________________ Date: ________ Weight: ____________________ Date: ________ Weight: ____________________ Date: ________ Weight: ____________________ Date: ________ Weight: ____________________ Date: ________ Weight: ____________________ Date: ________ Weight: ____________________ Date: ________ Weight: ____________________ Date: ________ Weight: ____________________ Date: ________ Weight: ____________________ Date: ________ Weight: ____________________ Date: ________ Weight: ____________________ Date: ________ Weight: ____________________ Date: ________ Weight: ____________________ Date: ________ Weight: ____________________ Date: ________ Weight: ____________________ Date: ________ Weight: ____________________ Date: ________ Weight: ____________________ Date: ________ Weight: ____________________ Date: ________ Weight: ____________________ Date: ________ Weight: ____________________ Date: ________ Weight: ____________________ Date: ________ Weight: ____________________ Date: ________ Weight: ____________________ Date: ________ Weight: ____________________ Date: ________ Weight: ____________________ Date: ________ Weight: ____________________ Date: ________ Weight: ____________________ Date: ________ Weight: ____________________ Date: ________ Weight: ____________________ Date: ________ Weight: ____________________ Date: ________ Weight: ____________________ Date: ________ Weight: ____________________ Date: ________ Weight: ____________________   This information is not intended to replace advice given to you by your health care provider. Make sure you discuss any questions you have with your health care provider.   Document Released: 02/14/2007 Document Revised: 12/24/2014 Document Reviewed: 07/02/2014 Elsevier Interactive Patient  Education 2016 Elsevier Inc.  Low-Sodium Eating Plan Sodium raises blood pressure and causes water to be held in the body. Getting less sodium from food will help lower your blood pressure, reduce any swelling, and protect your heart, liver, and kidneys. We get sodium by adding salt (sodium chloride) to food. Most of our sodium comes from canned, boxed, and frozen foods. Restaurant foods, fast foods, and pizza are also very high in sodium. Even if you take medicine to lower your blood pressure or to reduce fluid in your body, getting less sodium from your food is important. WHAT IS MY PLAN? Most people should limit their sodium intake to 2,300 mg a day. Your health care provider recommends  that you limit your sodium intake to _2000 mg_________ a day.  WHAT DO I NEED TO KNOW ABOUT THIS EATING PLAN? For the low-sodium eating plan, you will follow these general guidelines:  Choose foods with a % Daily Value for sodium of less than 5% (as listed on the food label).   Use salt-free seasonings or herbs instead of table salt or sea salt.   Check with your health care provider or pharmacist before using salt substitutes.   Eat fresh foods.  Eat more vegetables and fruits.  Limit canned vegetables. If you do use them, rinse them well to decrease the sodium.   Limit cheese to 1 oz (28 g) per day.   Eat lower-sodium products, often labeled as "lower sodium" or "no salt added."  Avoid foods that contain monosodium glutamate (MSG). MSG is sometimes added to [place] food and some canned foods.  Check food labels (Nutrition Facts labels) on foods to learn how much sodium is in one serving.  Eat more home-cooked food and less restaurant, buffet, and fast food.  When eating at a restaurant, ask that your food be prepared with less salt, or no salt if possible.  HOW DO I READ FOOD LABELS FOR SODIUM INFORMATION? The Nutrition Facts label lists the amount of sodium in one serving of the food.  If you eat more than one serving, you must multiply the listed amount of sodium by the number of servings. Food labels may also identify foods as:  Sodium free--Less than 5 mg in a serving.  Very low sodium--35 mg or less in a serving.  Low sodium--140 mg or less in a serving.  Light in sodium--50% less sodium in a serving. For example, if a food that usually has 300 mg of sodium is changed to become light in sodium, it will have 150 mg of sodium.  Reduced sodium--25% less sodium in a serving. For example, if a food that usually has 400 mg of sodium is changed to reduced sodium, it will have 300 mg of sodium. WHAT FOODS CAN I EAT? Grains Low-sodium cereals, including oats, puffed wheat and rice, and shredded wheat cereals. Low-sodium crackers. Unsalted rice and pasta. Lower-sodium bread.  Vegetables Frozen or fresh vegetables. Low-sodium or reduced-sodium canned vegetables. Low-sodium or reduced-sodium tomato sauce and paste. Low-sodium or reduced-sodium tomato and vegetable juices.  Fruits Fresh, frozen, and canned fruit. Fruit juice.  Meat and Other Protein Products Low-sodium canned tuna and salmon. Fresh or frozen meat, poultry, seafood, and fish. Lamb. Unsalted nuts. Dried beans, peas, and lentils without added salt. Unsalted canned beans. Homemade soups without salt. Eggs.  Dairy Milk. Soy milk. Ricotta cheese. Low-sodium or reduced-sodium cheeses. Yogurt.  Condiments Fresh and dried herbs and spices. Salt-free seasonings. Onion and garlic powders. Low-sodium varieties of mustard and ketchup. Fresh or refrigerated horseradish. Lemon juice.  Fats and Oils Reduced-sodium salad dressings. Unsalted butter.  Other Unsalted popcorn and pretzels.  The items listed above may not be a complete list of recommended foods or beverages. Contact your dietitian for more options. WHAT FOODS ARE NOT RECOMMENDED? Grains Instant hot cereals. Bread stuffing, pancake, and biscuit mixes.  Croutons. Seasoned rice or pasta mixes. Noodle soup cups. Boxed or frozen macaroni and cheese. Self-rising flour. Regular salted crackers. Vegetables Regular canned vegetables. Regular canned tomato sauce and paste. Regular tomato and vegetable juices. Frozen vegetables in sauces. Salted [place] fries. Olives. Rosita Fire. Relishes. Sauerkraut. Salsa. Meat and Other Protein Products Salted, canned, smoked, spiced, or pickled meats, seafood, or  fish. Bacon, ham, sausage, hot dogs, corned beef, chipped beef, and packaged luncheon meats. Salt pork. Jerky. Pickled herring. Anchovies, regular canned tuna, and sardines. Salted nuts. Dairy Processed cheese and cheese spreads. Cheese curds. Blue cheese and cottage cheese. Buttermilk.  Condiments Onion and garlic salt, seasoned salt, table salt, and sea salt. Canned and packaged gravies. Worcestershire sauce. Tartar sauce. Barbecue sauce. Teriyaki sauce. Soy sauce, including reduced sodium. Steak sauce. Fish sauce. Oyster sauce. Cocktail sauce. Horseradish that you find on the shelf. Regular ketchup and mustard. Meat flavorings and tenderizers. Bouillon cubes. Hot sauce. Tabasco sauce. Marinades. Taco seasonings. Relishes. Fats and Oils Regular salad dressings. Salted butter. Margarine. Ghee. Bacon fat.  Other Potato and tortilla chips. Corn chips and puffs. Salted popcorn and pretzels. Canned or dried soups. Pizza. Frozen entrees and pot pies.  The items listed above may not be a complete list of foods and beverages to avoid. Contact your dietitian for more information.   This information is not intended to replace advice given to you by your health care provider. Make sure you discuss any questions you have with your health care provider.   Document Released: 05/25/2002 Document Revised: 12/24/2014 Document Reviewed: 10/07/2013 Elsevier Interactive Patient Education [employer]

## 2016-01-15 ENCOUNTER — Ambulatory Visit (HOSPITAL_BASED_OUTPATIENT_CLINIC_OR_DEPARTMENT_OTHER): Payer: 59 | Attending: Physician Assistant

## 2016-01-15 VITALS — Ht 73.0 in | Wt 367.0 lb

## 2016-01-15 DIAGNOSIS — I493 Ventricular premature depolarization: Secondary | ICD-10-CM | POA: Diagnosis not present

## 2016-01-15 DIAGNOSIS — G4733 Obstructive sleep apnea (adult) (pediatric): Secondary | ICD-10-CM

## 2016-01-15 DIAGNOSIS — Z79899 Other long term (current) drug therapy: Secondary | ICD-10-CM | POA: Diagnosis not present

## 2016-01-15 DIAGNOSIS — G4730 Sleep apnea, unspecified: Secondary | ICD-10-CM | POA: Diagnosis not present

## 2016-01-15 DIAGNOSIS — R0683 Snoring: Secondary | ICD-10-CM | POA: Diagnosis not present

## 2016-01-18 ENCOUNTER — Telehealth: Payer: Self-pay | Admitting: Cardiology

## 2016-01-18 NOTE — Sleep Study (Signed)
Patient Name: Brittney, Caraway MRN: [id number] Study Date: 01/15/2016 Gender: Male D.O.B: [birth year] Age (years): 58 Referring Provider: Richardson Dopp Interpreting Physician: Fransico Him MD, ABSM RPSGT: Joni Reining  Weight (lbs): 367 BMI: 48 Height (inches): 73 Neck Size: 19.00  CLINICAL INFORMATION The patient is referred for a split night study with BPAP.  MEDICATIONS Medications taken by the patient : Bidil, Lasix, Lisinopril, Metolazone, Kdur Medications administered by patient during sleep study : No sleep medicine administered.  SLEEP STUDY TECHNIQUE As per the AASM Manual for the Scoring of Sleep and Associated Events v2.3 (April 2016) with a hypopnea requiring 4% desaturations. The channels recorded and monitored were frontal, central and occipital EEG, electrooculogram (EOG), submentalis EMG (chin), nasal and oral airflow, thoracic and abdominal wall motion, anterior tibialis EMG, snore microphone, electrocardiogram, and pulse oximetry. Bi-level positive airway pressure (BiPAP) was initiated when the patient met split night criteria and was titrated according to treat sleep-disordered breathing.  RESPIRATORY PARAMETERS Diagnostic Total AHI (/hr): 105.5  RDI (/hr):105.5  OA Index (/hr): 100.8  CA Index (/hr): 4.7 REM AHI (/hr): 90.9  NREM AHI (/hr):107.6  Supine AHI (/hr):105.3 Non-supine AHI (/hr):106.09 Min O2 Sat (%):57.00  Mean O2 (%):79.12  Time below 88% (min):107.1    Titration Optimal IPAP Pressure (cm): 23  Optimal EPAP Pressure (cm):19  AHI at Optimal Pressure (/hr):7.2  Min O2 at Optimal Pressure (%):92.0 Sleep % at Optimal (%):99  Supine % at Optimal (%):100      SLEEP ARCHITECTURE The study was initiated at 9:57:50 PM and terminated at 4:47:12 AM. The total recorded time was 409.4 minutes. EEG confirmed total sleep time was 367.0 minutes yielding a sleep efficiency of 89.7%. Sleep onset after lights out was 1.0 minutes with a REM latency of 77.5  minutes. The patient spent 5.59% of the night in stage N1 sleep, 58.86% in stage N2 sleep, 0.00% in stage N3 and 35.56% in REM. Wake after sleep onset (WASO) was 41.3 minutes. The Arousal Index was 60.5/hour.  LEG MOVEMENT DATA The total Periodic Limb Movements of Sleep (PLMS) were 0. The PLMS index was 0.00 .  CARDIAC DATA The 2 lead EKG demonstrated sinus rhythm. The mean heart rate was 90.28 beats per minute. Other EKG findings include: PVCs. and ventricular couplets.  IMPRESSIONS - Severe obstructive sleep apnea occurred during the diagnostic portion of the study (AHI = 105.5 /hour). An optimal PAP pressure was selected for this patient (23/19 cm of water). - No significant central sleep apnea occurred during the diagnostic portion of the study (CAI = 4.7/hour). - Severe oxygen desaturation was noted during the diagnostic portion of the study (Min O2 = 57.00%). - The patient snored with Loud snoring volume during the diagnostic portion of the study. - EKG findings include PVCs and ventricular couplets. - Clinically significant periodic limb movements of sleep did not occur during the study. DIAGNOSIS - Obstructive Sleep Apnea (327.23 [G47.33 ICD-10])  RECOMMENDATIONS - Trial of BiPAP therapy on 23/19 cm H2O with a Medium size Fisher&Paykel Full Face Mask Simplus mask and heated humidification. - Avoid alcohol, sedatives and other CNS depressants that may worsen sleep apnea and disrupt normal sleep architecture. - Sleep hygiene should be reviewed to assess factors that may improve sleep quality. - Weight management and regular exercise should be initiated or continued. - Return to Sleep Center for re-evaluation in 10 weeks.   [place], American Board of Sleep Medicine  ELECTRONICALLY SIGNED ON:  01/18/2016, 10:17 AM [facility] PH: [phone]   FX: [phone] [place]

## 2016-01-18 NOTE — Addendum Note (Signed)
Addended by: Armanda Magic R on: 01/18/2016 10:32 AM   Modules accepted: Orders

## 2016-01-18 NOTE — Telephone Encounter (Signed)
Please let patient know that they have significant sleep apnea and had successful PAP titration and will be set up with PAP unit.  Please let DME know that order is in EPIC.  Please set patient up for OV in 10 weeks

## 2016-01-20 NOTE — Telephone Encounter (Signed)
Attempted to contact patient to give results.   No answer/No Voicemail.    Will try to call back.

## 2016-01-22 NOTE — Progress Notes (Signed)
Cardiology Office Note:    Date:  01/23/2016   ID:  Roger Ward, DOB 04/26/1981, MRN [id number]  PCP:  Jeanine Luz, FNP  Cardiologist:  Dr. Thurmon Fair   Electrophysiologist:  n/a  Chief Complaint  Patient presents with  . Congestive Heart Failure    Follow up  . Shortness of Breath    WITH WALKING  . Leg Swelling    History of Present Illness:     Roger Ward is a 35 y.o. male with a hx of obesity, HTN, tobacco abuse. He was seen by cardiology in 3/16 during an admission for acute HF. Echo demonstrated diff HK with EF 40% and mild LVH. 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 Patient was therefore not placed on beta-blocker therapy. It was felt that beta-blocker should be avoided. It was suspected his bradycardia was related to severe OSA. He was started on nitrates, Hydralazine, ACE inhibitor. OP sleep study, Holter monitor and ETT nuclear stress test were recommended. He was lost to follow-up and either missed or canceled several appointments.  I saw him in 11/16 (first OV since admit to the hospital in 3/16).  Last seen by Dr. Thurmon Fair 01/09/16.  He was volume overloaded.  He was up 23 lbs.  Lasix was increased.  He was given one dose of Metolazone.  Lisinopril was increased.  He returns for FU.  He is overall stable. He feels that his leg edema is somewhat improved. He feels that his breathing is somewhat improved as well. He works in a [employer] and is constantly moving, running a machine. He denies chest discomfort or syncope. His wife often finds him in the middle of the night sitting on the edge of the bed asleep. He does not recall having symptoms of PND. He has an appointment with pulmonology soon to follow-up on sleep apnea. He has been wearing his CPAP machine for the last 1-2 weeks. He admits to eating out at fast food restaurants.   Past Medical History  Diagnosis Date  .  Essential hypertension   . Family history of adverse reaction to anesthesia     mother and aunt  both put to sleep for surgery and "stopped breathing"  . Asthma     Childhood  . Chronic combined systolic and diastolic CHF (congestive heart failure) (HCC)     a. echo 03/06/15 showing mild LVH, EF 40%, high ventricular filling pressures, mod MR, mildly dilated RV/mildly reduced RV function, mildly dilated RA, PASP .  . OSA (obstructive sleep apnea)     a. noncompliance with CPAP - wakes up with it off.  Marland Kitchen NSVT (nonsustained ventricular tachycardia) (HCC)     a. noted during 02/2015 admission.  . 2Nd degree AV block     a. noted during 02/2015 admission - second degree AV AV block (not further defined in notes). Not on BB due to this.  . Bradycardia     a. noted during 02/2015 admission - second degree AV AV block (not further defined in notes). Not on BB due to this.  . Diabetes mellitus, type 2 (HCC)   . Morbid obesity [facility])     Past Surgical History  Procedure Laterality Date  . No past surgeries      Current Medications: Outpatient Prescriptions Prior to Visit  Medication Sig Dispense Refill  . BIDIL 20-37.5 MG per tablet TAKE 1 TABLET BY MOUTH 3 TIMES DAILY.  90 tablet 0  . furosemide (LASIX) 80 MG tablet Take 1 tablet (80 mg total) by mouth 2 (two) times daily. 180 tablet 1  . potassium chloride SA (K-DUR,KLOR-CON) 20 MEQ tablet Take 1 tablet (20 mEq total) by mouth 2 (two) times daily. 180 tablet 1  . lisinopril (PRINIVIL,ZESTRIL) 20 MG tablet Take 1 tablet (20 mg total) by mouth daily. 90 tablet 1  . metolazone (ZAROXOLYN) 2.5 MG tablet Take 1 tablet (2.5 mg total) by mouth as directed. 10 tablet 0   No facility-administered medications prior to visit.     Allergies:   Penicillins   Social History   Social History  . Marital Status: Married    Spouse Name: N/A  . Number of Children: 0  . Years of Education: 12   Occupational History  . Supervisor at [employer]    Social History Main Topics  . Smoking status: Former Smoker -- 0.25 packs/day for 19 years    Types: Cigarettes    Start date: 01/05/1996    Quit date: 03/04/2015  . Smokeless tobacco: Never Used     Comment: Already decreasaed smoking  . Alcohol Use: 0.6 - 1.2 oz/week    1-2 Standard drinks or equivalent per week     Comment: Occasional  . Drug Use: No  . Sexual Activity: Yes    Birth Control/ Protection: Condom   Other Topics Concern  . None   Social History Narrative   Born and raised in [place]. Fun: Bowling, movies, we go   Denies religious beliefs that would effect health care.      Family History:  The patient's family history includes Breast cancer in his maternal grandmother; Diabetes in his maternal grandmother; Heart failure in his mother; Hyperlipidemia in his mother; Hypertension in his mother; Multiple sclerosis in his mother; Other in his maternal uncle.   ROS:   Please see the history of present illness.    ROS All other systems reviewed and are negative.   Physical Exam:    VS:  BP 160/110 mmHg  Pulse 102  Ht 6\' 1"  (1.854 m)  Wt 366 lb 1.9 oz (166.071 kg)  BMI 48.31 kg/m2  SpO2 93%   GEN: Well nourished, well developed, in no acute distress HEENT: normal Neck: no JVD, no masses Cardiac: Normal S1/S2, RRR; no murmurs; 1-2+ tight, brawny bilateral LE edema Respiratory:  Decreased breath sounds bilaterally, I cannot appreciate rales, faint expiratory wheezing throughout GI: Distended MS: no deformity or atrophy Skin: warm and dry, no rash Neuro:  no focal deficits  Psych: Alert and oriented x 3, normal affect  Wt Readings from Last 3 Encounters:  01/23/16 366 lb 1.9 oz (166.071 kg)  01/16/16 367 lb (166.47 kg)  01/09/16 375 lb 3.2 oz (170.19 kg)      Studies/Labs Reviewed:     EKG:  EKG is  ordered today.  The ekg ordered today demonstrates sinus tachy, HR 101, RBBB, no change from prior tracing.   Recent Labs: 03/05/2015:  B Natriuretic Peptide 338.9*; Hemoglobin 14.5; Platelets 346; TSH 0.568 03/06/2015: Magnesium 2.1 10/10/2015: ALT 19 11/08/2015: BUN 13; Creat 1.19; Potassium 3.9; Sodium 140   Recent Lipid Panel    Component Value Date/Time   CHOL 166 03/06/2015 0155   TRIG 248* 03/06/2015 0155   HDL 42 03/06/2015 0155   CHOLHDL 4.0 03/06/2015 0155   VLDL 50* 03/06/2015 0155   LDLCALC 74 03/06/2015 0155    Additional studies/ records that were reviewed today  include:   Echo 3/16 Mild LVH, EF 40%, diffuse HK, moderate MR, mild LAE, mild RVE, mildly reduced RVSF, mild RAE, PASP 45 mmHg  ASSESSMENT:     1. Acute on chronic combined systolic and diastolic CHF (congestive heart failure) (HCC)   2. Cardiomyopathy (HCC)   3. Hypertensive heart disease with heart failure (HCC)   4. Second degree AV block   5. OSA (obstructive sleep apnea)   6. Tobacco abuse   7. Morbid obesity, unspecified obesity type (HCC)      PLAN:     In order of problems listed above:  1. Acute on Chronic Combined Systolic and Diastolic CHF: He remains volume overloaded.  His weight is down 9 lbs since last seen.  I think he needs further diuresis.  Blood pressure also needs better control.  -  Obtain follow-up BMET today  -  Increase lisinopril to 20 mg twice a day  -  Increase metolazone to 2.5 mg twice weekly  -  BMET one week  -  Consider the addition of spironolactone if renal function and K+ ok on BMET today  -  Consider changing ACE inhibitor to Entresto at next visit.  2. Dilated Cardiomyopathy: Probably related to HTN. Given his size, nuclear stress test would likely not yield useful information. We'll plan on repeating his echocardiogram once his heart failure medications are maximized and his sleep apnea is treated. If his EF is not normal at that point, consider further testing.   3. Hypertensive Heart Disease: Blood pressure uncontrolled. Increase lisinopril as noted. Consider adding spironolactone if  renal function and potassium stable. We discussed the importance of limiting salt.    4. Bradycardia - Patient with tachycardia and bradycardia, NSVT, 2nd degree AVB in the hospital. He has evidence of conduction system disease with RBBB.  Once his sleep apnea is adequately treated, we will obtain a Holter monitor to reassess for evidence of AV block.  5. OSA: Continue CPAP. Follow-up with pulmonology.  6. Tobacco Abuse - I again recommended cessation.  7. Obesity: - We discussed the importance of weight loss.    Medication Adjustments/Labs and Tests Ordered: Current medicines are reviewed at length with the patient today.  Concerns regarding medicines are outlined above.  Medication changes, Labs and Tests ordered today are outlined in the Patient Instructions noted below. Patient Instructions  Medication Instructions:  1. TAKE METOLAZONE 2.5 MG EVERY Tuesday AND Saturday; MAKE SURE TO TAKE THIS 30 MINUTES BEFORE MORNING DOSE OF LASIX  2. ON METOLAZONE DAYS YOU WILL NEED TO TAKE AN EXTRA POTASSIUM ON Tuesday AND Saturday'S  3. INCREASE LISINOPRIL TO 20 MG TWICE DAILY  Labwork: 1. TODAY BMET  2. BMET 2/13  Testing/Procedures: NONE  Follow-Up: 1. Trinidad Ingle, PAC 2 WEEKS  2. DR. Royann Shivers IN 2 MONTHS  Any Other Special Instructions Will Be Listed Below (If Applicable).   If you need a refill on your cardiac medications before your next appointment, please call your pharmacy.      Signed, Tereso Newcomer, PA-C  01/23/2016 4:32 PM    [facility] [address] [place], [place], [place]  [address] Phone: [phone]; Fax: [phone]

## 2016-01-23 ENCOUNTER — Encounter: Payer: Self-pay | Admitting: Physician Assistant

## 2016-01-23 ENCOUNTER — Ambulatory Visit (INDEPENDENT_AMBULATORY_CARE_PROVIDER_SITE_OTHER): Payer: 59 | Admitting: Physician Assistant

## 2016-01-23 VITALS — BP 160/110 | HR 102 | Ht 73.0 in | Wt 366.1 lb

## 2016-01-23 DIAGNOSIS — Z720 Tobacco use: Secondary | ICD-10-CM

## 2016-01-23 DIAGNOSIS — I441 Atrioventricular block, second degree: Secondary | ICD-10-CM | POA: Diagnosis not present

## 2016-01-23 DIAGNOSIS — G4733 Obstructive sleep apnea (adult) (pediatric): Secondary | ICD-10-CM

## 2016-01-23 DIAGNOSIS — I5042 Chronic combined systolic (congestive) and diastolic (congestive) heart failure: Secondary | ICD-10-CM

## 2016-01-23 DIAGNOSIS — I110 Hypertensive heart disease with heart failure: Secondary | ICD-10-CM | POA: Diagnosis not present

## 2016-01-23 DIAGNOSIS — I5043 Acute on chronic combined systolic (congestive) and diastolic (congestive) heart failure: Secondary | ICD-10-CM | POA: Diagnosis not present

## 2016-01-23 DIAGNOSIS — I429 Cardiomyopathy, unspecified: Secondary | ICD-10-CM | POA: Diagnosis not present

## 2016-01-23 LAB — BASIC METABOLIC PANEL
BUN: 15 mg/dL (ref 7–25)
CALCIUM: 8.9 mg/dL (ref 8.6–10.3)
CO2: 27 mmol/L (ref 20–31)
Chloride: 101 mmol/L (ref 98–110)
Creat: 1.07 mg/dL (ref 0.60–1.35)
GLUCOSE: 92 mg/dL (ref 65–99)
Potassium: 3.9 mmol/L (ref 3.5–5.3)
SODIUM: 140 mmol/L (ref 135–146)

## 2016-01-23 MED ORDER — LISINOPRIL 20 MG PO TABS
20.0000 mg | ORAL_TABLET | Freq: Two times a day (BID) | ORAL | Status: DC
Start: 2016-01-23 — End: 2016-12-07

## 2016-01-23 MED ORDER — METOLAZONE 2.5 MG PO TABS: 2.5000 mg | ORAL_TABLET | ORAL | Status: DC

## 2016-01-23 NOTE — Patient Instructions (Addendum)
Medication Instructions:  1. TAKE METOLAZONE 2.5 MG EVERY Tuesday AND Saturday; MAKE SURE TO TAKE THIS 30 MINUTES BEFORE MORNING DOSE OF LASIX  2. ON METOLAZONE DAYS YOU WILL NEED TO TAKE AN EXTRA POTASSIUM ON Tuesday AND Saturday'S  3. INCREASE LISINOPRIL TO 20 MG TWICE DAILY  Labwork: 1. TODAY BMET  2. BMET 2/13  Testing/Procedures: NONE  Follow-Up: 1. SCOTT WEAVER, PAC 2 WEEKS  2. DR. Royann Shivers IN 2 MONTHS  Any Other Special Instructions Will Be Listed Below (If Applicable).   If you need a refill on your cardiac medications before your next appointment, please call your pharmacy.

## 2016-01-24 ENCOUNTER — Telehealth: Payer: Self-pay | Admitting: Physician Assistant

## 2016-01-24 NOTE — Telephone Encounter (Signed)
[name] is returning a call

## 2016-01-24 NOTE — Progress Notes (Signed)
Thanks. Definitely work in progress.

## 2016-01-24 NOTE — Telephone Encounter (Signed)
Per DPR -  Spoke with wife regarding results.  Stated verbal understanding.   Message has been sent to [facility]. Once patient starts BiPAP therapy, I will schedule 10 week appointment.  Wife is aware that I will call them back to reschedule this.

## 2016-01-30 ENCOUNTER — Other Ambulatory Visit (INDEPENDENT_AMBULATORY_CARE_PROVIDER_SITE_OTHER): Payer: 59 | Admitting: *Deleted

## 2016-01-30 DIAGNOSIS — I110 Hypertensive heart disease with heart failure: Secondary | ICD-10-CM | POA: Diagnosis not present

## 2016-01-30 DIAGNOSIS — I5043 Acute on chronic combined systolic (congestive) and diastolic (congestive) heart failure: Secondary | ICD-10-CM | POA: Diagnosis not present

## 2016-01-30 LAB — BASIC METABOLIC PANEL
BUN: 14 mg/dL (ref 7–25)
CALCIUM: 9.6 mg/dL (ref 8.6–10.3)
CHLORIDE: 98 mmol/L (ref 98–110)
CO2: 31 mmol/L (ref 20–31)
Creat: 1.04 mg/dL (ref 0.60–1.35)
Glucose, Bld: 93 mg/dL (ref 65–99)
POTASSIUM: 3.9 mmol/L (ref 3.5–5.3)
SODIUM: 140 mmol/L (ref 135–146)

## 2016-01-30 NOTE — Addendum Note (Signed)
Addended by: Tonita Phoenix on: 01/30/2016 03:34 PM   Modules accepted: Orders

## 2016-01-31 ENCOUNTER — Telehealth: Payer: Self-pay | Admitting: *Deleted

## 2016-01-31 DIAGNOSIS — I5041 Acute combined systolic (congestive) and diastolic (congestive) heart failure: Secondary | ICD-10-CM

## 2016-01-31 DIAGNOSIS — I10 Essential (primary) hypertension: Secondary | ICD-10-CM

## 2016-01-31 DIAGNOSIS — I110 Hypertensive heart disease with heart failure: Secondary | ICD-10-CM

## 2016-01-31 DIAGNOSIS — I429 Cardiomyopathy, unspecified: Secondary | ICD-10-CM

## 2016-01-31 NOTE — Telephone Encounter (Signed)
Lmptcb to go over results and med changes.

## 2016-02-01 ENCOUNTER — Institutional Professional Consult (permissible substitution): Payer: 59 | Admitting: Pulmonary Disease

## 2016-02-01 MED ORDER — SPIRONOLACTONE 25 MG PO TABS: 25.0000 mg | ORAL_TABLET | Freq: Every day | ORAL | Status: DC

## 2016-02-01 MED ORDER — POTASSIUM CHLORIDE CRYS ER 20 MEQ PO TBCR: 20.0000 meq | EXTENDED_RELEASE_TABLET | Freq: Every day | ORAL | Status: DC

## 2016-02-01 NOTE — Telephone Encounter (Signed)
No answer

## 2016-02-01 NOTE — Telephone Encounter (Signed)
Pt has been notified of lab results and med changes and is agreeable to plan of care. Start Spironolactone 25 mg daily, decrease K+ 20 meq daily, ok to still take extra K+ on Metolazone days, BMET 2/22, 3/2.

## 2016-02-01 NOTE — Telephone Encounter (Signed)
F/u  Pt returning RN phone call. Please call back and discuss.   

## 2016-02-07 NOTE — Progress Notes (Signed)
Cardiology Office Note:    Date:  02/08/2016   ID:  Roger Ward, DOB Jul 05, 1981, MRN [id number]  PCP:  Jeanine Luz, FNP  Cardiologist:  Dr. Thurmon Fair   Electrophysiologist:  n/a  Chief Complaint  Patient presents with  . Congestive Heart Failure    Follow up    History of Present Illness:     Roger Ward is a 35 y.o. male with a hx of obesity, HTN, tobacco abuse. He was seen by cardiology in 3/16 during an admission for acute HF. Echo demonstrated diff HK with EF 40% and mild LVH. 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 Patient was therefore not placed on beta-blocker therapy. It was felt that beta-blocker should be avoided. It was suspected his bradycardia was related to severe OSA. He was started on nitrates, Hydralazine, ACE inhibitor. OP sleep study, Holter monitor and ETT nuclear stress test were recommended. He was lost to follow-up and either missed or canceled several appointments.  I saw him in 11/16 (first OV since admit to the hospital in 3/16).  He was last seen 2/6 by me.  I adjusted his diuretics further and placed him on Spironolactone.  He returns for FU.  His weight is down 17 lbs. His breathing is improved.  LE edema is better.  Denies orthopnea, PND. Denies chest pain, syncope. Denies cough.  He has been wearing his CPAP x 1 week.  He states he feels a lot better.     Past Medical History  Diagnosis Date  . Essential hypertension   . Family history of adverse reaction to anesthesia     mother and aunt  both put to sleep for surgery and "stopped breathing"  . Asthma     Childhood  . Chronic combined systolic and diastolic CHF (congestive heart failure) (HCC)     a. echo 03/06/15 showing mild LVH, EF 40%, high ventricular filling pressures, mod MR, mildly dilated RV/mildly reduced RV function, mildly dilated RA, PASP .  . OSA (obstructive sleep apnea)     a.  noncompliance with CPAP - wakes up with it off.  Marland Kitchen NSVT (nonsustained ventricular tachycardia) (HCC)     a. noted during 02/2015 admission.  . 2Nd degree AV block     a. noted during 02/2015 admission - second degree AV AV block (not further defined in notes). Not on BB due to this.  . Bradycardia     a. noted during 02/2015 admission - second degree AV AV block (not further defined in notes). Not on BB due to this.  . Diabetes mellitus, type 2 (HCC)   . Morbid obesity [facility])     Past Surgical History  Procedure Laterality Date  . No past surgeries      Current Medications: Outpatient Prescriptions Prior to Visit  Medication Sig Dispense Refill  . BIDIL 20-37.5 MG per tablet TAKE 1 TABLET BY MOUTH 3 TIMES DAILY. 90 tablet 0  . furosemide (LASIX) 80 MG tablet Take 1 tablet (80 mg total) by mouth 2 (two) times daily. 180 tablet 1  . lisinopril (PRINIVIL,ZESTRIL) 20 MG tablet Take 1 tablet (20 mg total) by mouth 2 (two) times daily. 60 tablet 11  . metolazone (ZAROXOLYN) 2.5 MG tablet Take 1 tablet (2.5 mg total) by mouth as directed. Take 2.5 mg every Tues, and Saturday 30 minutes before morning lasix 10 tablet 0  . potassium chloride SA (K-DUR,KLOR-CON) 20  MEQ tablet Take 1 tablet (20 mEq total) by mouth daily.    Marland Kitchen spironolactone (ALDACTONE) 25 MG tablet Take 1 tablet (25 mg total) by mouth daily. 30 tablet 11   No facility-administered medications prior to visit.     Allergies:   Penicillins   Social History   Social History  . Marital Status: Married    Spouse Name: N/A  . Number of Children: 0  . Years of Education: 12   Occupational History  . Supervisor at [employer]    Social History Main Topics  . Smoking status: Former Smoker -- 0.25 packs/day for 19 years    Types: Cigarettes    Start date: 01/05/1996    Quit date: 03/04/2015  . Smokeless tobacco: Never Used     Comment: Already decreasaed smoking  . Alcohol Use: 0.6 - 1.2 oz/week    1-2 Standard drinks  or equivalent per week     Comment: Occasional  . Drug Use: No  . Sexual Activity: Yes    Birth Control/ Protection: Condom   Other Topics Concern  . None   Social History Narrative   Born and raised in [place]. Fun: Bowling, movies, we go   Denies religious beliefs that would effect health care.      Family History:  The patient's family history includes Breast cancer in his maternal grandmother; Diabetes in his maternal grandmother; Heart failure in his mother; Hyperlipidemia in his mother; Hypertension in his mother; Multiple sclerosis in his mother; Other in his maternal uncle.   ROS:   Please see the history of present illness.    Review of Systems  Cardiovascular: Positive for leg swelling.  All other systems reviewed and are negative.   Physical Exam:    VS:  BP 120/80 mmHg  Pulse 100  Ht  (1.854 m)  Wt 349 lb 12.8 oz (158.668 kg)  BMI 46.16 kg/m2   GEN: Well nourished, well developed, in no acute distress HEENT: normal Neck: I cannot assess JVD, no masses Cardiac: Normal S1/S2, RRR;  no murmurs; tight trace bilateral LE edema Respiratory:  Decreased breath sounds bilaterally, I cannot appreciate rales,  wheezing   GI: soft non-tender MS: no deformity or atrophy Skin: warm and dry  Neuro:  no focal deficits  Psych: Alert and oriented x 3, normal affect  Wt Readings from Last 3 Encounters:  02/08/16 349 lb 12.8 oz (158.668 kg)  01/23/16 366 lb 1.9 oz (166.071 kg)  01/16/16 367 lb (166.47 kg)      Studies/Labs Reviewed:     EKG:  EKG is not  ordered today.  The ekg ordered today demonstrates n/a  Recent Labs: 03/05/2015: B Natriuretic Peptide 338.9*; Hemoglobin 14.5; Platelets 346; TSH 0.568 03/06/2015: Magnesium 2.1 10/10/2015: ALT 19 01/30/2016: BUN 14; Creat 1.04; Potassium 3.9; Sodium 140    Recent Lipid Panel    Component Value Date/Time   CHOL 166 03/06/2015 0155   TRIG 248* 03/06/2015 0155   HDL 42 03/06/2015 0155   CHOLHDL 4.0  03/06/2015 0155   VLDL 50* 03/06/2015 0155   LDLCALC 74 03/06/2015 0155    Additional studies/ records that were reviewed today include:   Echo 3/16 Mild LVH, EF 40%, diffuse HK, moderate MR, mild LAE, mild RVE, mildly reduced RVSF, mild RAE, PASP 45 mmHg  ASSESSMENT:     1. Chronic combined systolic and diastolic CHF (congestive heart failure) (HCC)   2. Dilated cardiomyopathy (HCC)   3. Hypertensive heart disease with heart  failure (HCC)   4. Bradycardia   5. OSA (obstructive sleep apnea)   6. Dyspnea      PLAN:     In order of problems listed above:  1. Chronic Combined Systolic and Diastolic CHF - Volume is improved.  His weight is down 17 lbs since last seen.  Continue Lasix, metolazone, Lisinopril, BiDil, Spironolactone.   -  He will bring FMLA forms back and I will fill them out (intermittent FMLA)  2. Dilated Cardiomyopathy - Probably related to HTN. Given his size, nuclear stress test would likely not yield useful information. We'll plan on repeating his echocardiogram once his heart failure medications are maximized and his sleep apnea is treated. If his EF is not normal at that point, consider further testing.   3. Hypertensive Heart Disease - Blood pressure now controlled. He just started Spironolactone a few days ago. Check BMET today and repeat in 1 week.     4. Bradycardia - Patient with tachycardia and bradycardia, NSVT, 2nd degree AVB in the hospital last year. He has evidence of conduction system disease with RBBB.  Plan has been to get a Holter once his sleep apnea is adequately treated to reassess for evidence of AV block.  -  Get 48 Holter 1 week before next visit with Dr. Rachelle Hora Croitoru in 03/2016.  5. OSA: Continue CPAP. Follow-up with Dr. Armanda Magic as planned.     Medication Adjustments/Labs and Tests Ordered: Current medicines are reviewed at length with the patient today.  Concerns regarding medicines are outlined above.  Medication changes,  Labs and Tests ordered today are outlined in the Patient Instructions noted below. Patient Instructions  Medication Instructions:  Your physician recommends that you continue on your current medications as directed. Please refer to the Current Medication list given to you today.  Labwork: TODAY:  BMET 1 WEEK:  BMET  Testing/Procedures: Your physician has recommended that you wear a holter monitor AND THAT YOU HAVE IT PLACED AROUND 03/15/16 (1 WEEK BEFORE YOUR F/U APPT WITH DR. Royann Shivers.   Holter monitors are medical devices that record the heart's electrical activity. Doctors most often use these monitors to diagnose arrhythmias. Arrhythmias are problems with the speed or rhythm of the heartbeat. The monitor is a small, portable device. You can wear one while you do your normal daily activities. This is usually used to diagnose what is causing palpitations/syncope (passing out).  Follow-Up: Your physician recommends that you keep your scheduled follow-up appointments AS PLANNED  Any Other Special Instructions Will Be Listed Below (If Applicable).  If you need a refill on your cardiac medications before your next appointment, please call your pharmacy.    Signed, Tereso Newcomer, PA-C  02/08/2016 4:52 PM    [facility] [address] [place], [place], [place]  [address] Phone: [phone]; Fax: [phone]

## 2016-02-08 ENCOUNTER — Ambulatory Visit (INDEPENDENT_AMBULATORY_CARE_PROVIDER_SITE_OTHER): Payer: 59 | Admitting: Physician Assistant

## 2016-02-08 ENCOUNTER — Encounter: Payer: Self-pay | Admitting: Physician Assistant

## 2016-02-08 ENCOUNTER — Other Ambulatory Visit: Payer: 59

## 2016-02-08 VITALS — BP 120/80 | HR 100 | Ht 73.0 in | Wt 349.8 lb

## 2016-02-08 DIAGNOSIS — I429 Cardiomyopathy, unspecified: Secondary | ICD-10-CM | POA: Diagnosis not present

## 2016-02-08 DIAGNOSIS — I5042 Chronic combined systolic (congestive) and diastolic (congestive) heart failure: Secondary | ICD-10-CM

## 2016-02-08 DIAGNOSIS — I10 Essential (primary) hypertension: Secondary | ICD-10-CM | POA: Diagnosis not present

## 2016-02-08 DIAGNOSIS — R001 Bradycardia, unspecified: Secondary | ICD-10-CM

## 2016-02-08 DIAGNOSIS — G4733 Obstructive sleep apnea (adult) (pediatric): Secondary | ICD-10-CM

## 2016-02-08 DIAGNOSIS — I110 Hypertensive heart disease with heart failure: Secondary | ICD-10-CM | POA: Diagnosis not present

## 2016-02-08 DIAGNOSIS — I5041 Acute combined systolic (congestive) and diastolic (congestive) heart failure: Secondary | ICD-10-CM

## 2016-02-08 DIAGNOSIS — I420 Dilated cardiomyopathy: Secondary | ICD-10-CM

## 2016-02-08 DIAGNOSIS — R0600 Dyspnea, unspecified: Secondary | ICD-10-CM

## 2016-02-08 LAB — BASIC METABOLIC PANEL
BUN: 12 mg/dL (ref 7–25)
CALCIUM: 9.6 mg/dL (ref 8.6–10.3)
CO2: 29 mmol/L (ref 20–31)
Chloride: 101 mmol/L (ref 98–110)
Creat: 1.33 mg/dL (ref 0.60–1.35)
GLUCOSE: 88 mg/dL (ref 65–99)
Potassium: 3.9 mmol/L (ref 3.5–5.3)
SODIUM: 141 mmol/L (ref 135–146)

## 2016-02-08 NOTE — Patient Instructions (Addendum)
Medication Instructions:  Your physician recommends that you continue on your current medications as directed. Please refer to the Current Medication list given to you today.  Labwork: TODAY:  BMET 1 WEEK:  BMET  Testing/Procedures: Your physician has recommended that you wear a holter monitor AND THAT YOU HAVE IT PLACED AROUND 03/15/16 (1 WEEK BEFORE YOUR F/U APPT WITH DR. Royann Shivers.   Holter monitors are medical devices that record the heart's electrical activity. Doctors most often use these monitors to diagnose arrhythmias. Arrhythmias are problems with the speed or rhythm of the heartbeat. The monitor is a small, portable device. You can wear one while you do your normal daily activities. This is usually used to diagnose what is causing palpitations/syncope (passing out).  Follow-Up: Your physician recommends that you keep your scheduled follow-up appointments AS PLANNED  Any Other Special Instructions Will Be Listed Below (If Applicable).  If you need a refill on your cardiac medications before your next appointment, please call your pharmacy.

## 2016-02-09 ENCOUNTER — Telehealth: Payer: Self-pay | Admitting: *Deleted

## 2016-02-09 NOTE — Telephone Encounter (Signed)
no answer

## 2016-02-09 NOTE — Telephone Encounter (Signed)
Pt notified of lab results by phone with verbal understanding.  

## 2016-02-15 ENCOUNTER — Other Ambulatory Visit: Payer: 59

## 2016-02-16 ENCOUNTER — Telehealth: Payer: Self-pay | Admitting: *Deleted

## 2016-02-16 ENCOUNTER — Other Ambulatory Visit (INDEPENDENT_AMBULATORY_CARE_PROVIDER_SITE_OTHER): Payer: 59 | Admitting: *Deleted

## 2016-02-16 DIAGNOSIS — I10 Essential (primary) hypertension: Secondary | ICD-10-CM

## 2016-02-16 DIAGNOSIS — I5041 Acute combined systolic (congestive) and diastolic (congestive) heart failure: Secondary | ICD-10-CM

## 2016-02-16 DIAGNOSIS — I110 Hypertensive heart disease with heart failure: Secondary | ICD-10-CM

## 2016-02-16 DIAGNOSIS — I429 Cardiomyopathy, unspecified: Secondary | ICD-10-CM

## 2016-02-16 LAB — BASIC METABOLIC PANEL
BUN: 9 mg/dL (ref 7–25)
CALCIUM: 9.3 mg/dL (ref 8.6–10.3)
CHLORIDE: 100 mmol/L (ref 98–110)
CO2: 28 mmol/L (ref 20–31)
CREATININE: 0.9 mg/dL (ref 0.60–1.35)
Glucose, Bld: 92 mg/dL (ref 65–99)
Potassium: 4.5 mmol/L (ref 3.5–5.3)
SODIUM: 139 mmol/L (ref 135–146)

## 2016-02-16 NOTE — Telephone Encounter (Signed)
DPR on file for pt's wife, who has been notified of lab results by phone with verbal understanding.

## 2016-02-22 ENCOUNTER — Other Ambulatory Visit: Payer: Self-pay | Admitting: Family

## 2016-03-02 ENCOUNTER — Encounter: Payer: Self-pay | Admitting: Cardiology

## 2016-03-12 ENCOUNTER — Encounter: Payer: Self-pay | Admitting: *Deleted

## 2016-03-12 ENCOUNTER — Ambulatory Visit (INDEPENDENT_AMBULATORY_CARE_PROVIDER_SITE_OTHER): Payer: 59

## 2016-03-12 DIAGNOSIS — R001 Bradycardia, unspecified: Secondary | ICD-10-CM

## 2016-03-12 DIAGNOSIS — I5042 Chronic combined systolic (congestive) and diastolic (congestive) heart failure: Secondary | ICD-10-CM

## 2016-03-17 ENCOUNTER — Other Ambulatory Visit: Payer: Self-pay | Admitting: Family

## 2016-03-22 ENCOUNTER — Encounter: Payer: Self-pay | Admitting: Cardiovascular Disease

## 2016-03-22 ENCOUNTER — Ambulatory Visit (INDEPENDENT_AMBULATORY_CARE_PROVIDER_SITE_OTHER): Payer: 59 | Admitting: Cardiovascular Disease

## 2016-03-22 VITALS — BP 164/106 | HR 98 | Ht 71.0 in | Wt 356.2 lb

## 2016-03-22 DIAGNOSIS — I4729 Other ventricular tachycardia: Secondary | ICD-10-CM

## 2016-03-22 DIAGNOSIS — I5042 Chronic combined systolic (congestive) and diastolic (congestive) heart failure: Secondary | ICD-10-CM | POA: Diagnosis not present

## 2016-03-22 DIAGNOSIS — G4733 Obstructive sleep apnea (adult) (pediatric): Secondary | ICD-10-CM

## 2016-03-22 DIAGNOSIS — I10 Essential (primary) hypertension: Secondary | ICD-10-CM | POA: Diagnosis not present

## 2016-03-22 DIAGNOSIS — I110 Hypertensive heart disease with heart failure: Secondary | ICD-10-CM | POA: Diagnosis not present

## 2016-03-22 DIAGNOSIS — I441 Atrioventricular block, second degree: Secondary | ICD-10-CM

## 2016-03-22 DIAGNOSIS — I472 Ventricular tachycardia: Secondary | ICD-10-CM | POA: Diagnosis not present

## 2016-03-22 MED ORDER — METOLAZONE 2.5 MG PO TABS: ORAL_TABLET | ORAL | Status: DC

## 2016-03-22 MED ORDER — SPIRONOLACTONE 50 MG PO TABS: 50.0000 mg | ORAL_TABLET | Freq: Every day | ORAL | Status: DC

## 2016-03-22 NOTE — Patient Instructions (Addendum)
Medication Instructions:  1. Spironoalatone is increased from 25mg  to 50mg  once a day  2. Matolazone is increased to 3 times a week Tuesday, Thursday and Saturday  3. STOP Potassium   Labwork: None  Testing/Procedures: None  Follow-Up: Follow up with Tereso Newcomer PA-C in 6 weeks  Follow up with Dr Royann Shivers in 3 months   Any Other Special Instructions Will Be Listed Below (If Applicable). Requesting Doctors note    If you need a refill on your cardiac medications before your next appointment, please call your pharmacy.

## 2016-03-22 NOTE — Progress Notes (Signed)
Patient ID: Roger Ward, male   DOB: [birth year], 35 y.o.   MRN: [id number] Patient ID: Roger Ward, male   DOB: 04/27/1981, 35 y.o.   MRN: [id number]    Cardiology Office Note    Date:  03/23/2016   ID:  Roger Ward, DOB 11/04/1981, MRN [id number]  PCP:  Jeanine Luz, FNP  Cardiologist:   Thurmon Fair, MD   Chief Complaint  Patient presents with  . Follow-up    no chest pain, no shortness of breath, no edema, no pain or cramping in legs, no lightheaded or dizziness     History of Present Illness:  Roger Ward is a 35 y.o. 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   He has NYHA class I-II exertional dyspnea and minimal lower extremity edema. He denies orthopnea. He denies chest pain and has not syncope or palpitations. He has started using CPAP, reports 100% compliance and has already noticed improved energy level . He has not had problems with gynecomastia.  At his last visit with Tereso Newcomer he had an excellent blood pressure will 120/80. Today his blood pressure is much higher although she states that he has taken all his medicines as usual. His weight is 6 pounds higher than it was at his appointment in February, but is 20 pounds less than it was in January.  Past Medical History  Diagnosis Date  . Essential hypertension   . Family history of adverse reaction to anesthesia     mother and aunt  both put to sleep for surgery and "stopped breathing"  . Asthma     Childhood  . Chronic combined systolic and diastolic CHF (congestive heart failure) (HCC)     a. echo 03/06/15 showing mild LVH, EF 40%, high ventricular filling pressures, mod MR, mildly dilated RV/mildly reduced RV function, mildly dilated RA, PASP .  . OSA (obstructive sleep apnea)     a.  noncompliance with CPAP - wakes up with it off.  Marland Kitchen NSVT (nonsustained ventricular tachycardia) (HCC)     a. noted during 02/2015 admission.  . 2Nd degree AV block     a. noted during 02/2015 admission - second degree AV AV block (not further defined in notes). Not on BB due to this.  . Bradycardia     a. noted during 02/2015 admission - second degree AV AV block (not further defined in notes). Not on BB due to this.  . Diabetes mellitus, type 2 (HCC)   . Morbid obesity [facility])     Past Surgical History  Procedure Laterality Date  . No past surgeries      Outpatient Prescriptions Prior to Visit  Medication Sig Dispense Refill  . BIDIL 20-37.5 MG tablet TAKE 1 TABLET BY MOUTH 3 TIMES DAILY. 270 tablet 0  . furosemide (LASIX) 80 MG tablet Take 1 tablet (80 mg total) by mouth 2 (two) times daily. 180 tablet 1  . lisinopril (PRINIVIL,ZESTRIL) 20 MG tablet Take 1 tablet (20 mg total) by mouth 2 (two) times daily. 60 tablet 11  . metolazone (ZAROXOLYN) 2.5 MG tablet Take 1 tablet (2.5 mg total) by mouth as directed. Take 2.5 mg every Tues, and Saturday 30 minutes before morning lasix 10 tablet 0  . spironolactone (ALDACTONE) 25 MG tablet Take 1 tablet (25 mg total) by mouth daily. 30 tablet 11  .  BIDIL 20-37.5 MG per tablet TAKE 1 TABLET BY MOUTH 3 TIMES DAILY. (Patient not taking: Reported on 03/22/2016) 90 tablet 0  . furosemide (LASIX) 40 MG tablet TAKE 1 TABLET BY MOUTH 2 TIMES DAILY. (Patient not taking: Reported on 03/22/2016) 180 tablet 0  . lisinopril (PRINIVIL,ZESTRIL) 5 MG tablet TAKE 1 TABLET BY MOUTH DAILY. (Patient not taking: Reported on 03/22/2016) 90 tablet 0  . potassium chloride SA (K-DUR,KLOR-CON) 20 MEQ tablet Take 1 tablet (20 mEq total) by mouth daily. (Patient not taking: Reported on 03/22/2016)     No facility-administered medications prior to visit.     Allergies:   Penicillins   Social History   Social History  . Marital Status: Married    Spouse Name: N/A  . Number of  Children: 0  . Years of Education: 12   Occupational History  . Supervisor at [employer]    Social History Main Topics  . Smoking status: Former Smoker -- 0.25 packs/day for 19 years    Types: Cigarettes    Start date: 01/05/1996    Quit date: 03/04/2015  . Smokeless tobacco: Never Used     Comment: Already decreasaed smoking  . Alcohol Use: 0.6 - 1.2 oz/week    1-2 Standard drinks or equivalent per week     Comment: Occasional  . Drug Use: No  . Sexual Activity: Yes    Birth Control/ Protection: Condom   Other Topics Concern  . None   Social History Narrative   Born and raised in [place]. Fun: Bowling, movies, we go   Denies religious beliefs that would effect health care.      Family History:  The patient's family history includes Breast cancer in his maternal grandmother; Diabetes in his maternal grandmother; Heart failure in his mother; Hyperlipidemia in his mother; Hypertension in his mother; Multiple sclerosis in his mother; Other in his maternal uncle.   ROS:   Please see the history of present illness.    ROS All other systems reviewed and are negative.   PHYSICAL EXAM:   VS:  BP 164/106 mmHg  Pulse 98  Ht  (1.803 m)  Wt 161.594 kg (356 lb 4 oz)  BMI 49.71 kg/m2   GEN: Well nourished, well developed, in no acute distress. Obesity severely limits the physical exam HEENT: normal Neck: unable to see his JVD,know  carotid bruits, or masses Cardiac: RRR; no murmurs, rubs, or gallops,unable to locate the apical impulse, 1+ pitting edema of the lower extremities to the knees bilaterally  Respiratory:  clear to auscultation bilaterally, normal work of breathing GI: obesity limits the exam, unable to palpate his liver, soft, nontender, nondistended, + BS MS: no deformity or atrophy Skin: warm and dry, no rash Neuro:  Alert and Oriented x 3, Strength and sensation are intact Psych: euthymic mood, full affect  Wt Readings from Last 3 Encounters:    03/22/16 161.594 kg (356 lb 4 oz)  02/08/16 158.668 kg (349 lb 12.8 oz)  01/23/16 166.071 kg (366 lb 1.9 oz)      Studies/Labs Reviewed:   EKG:  EKG is not ordered today.   Recent Labs: 10/10/2015: ALT 19 02/16/2016: BUN 9; Creat 0.90; Potassium 4.5; Sodium 139   Lipid Panel    Component Value Date/Time   CHOL 166 03/06/2015 0155   TRIG 248* 03/06/2015 0155   HDL 42 03/06/2015 0155   CHOLHDL 4.0 03/06/2015 0155   VLDL 50* 03/06/2015 0155   LDLCALC 74 03/06/2015 0155  ASSESSMENT:    1. Chronic combined systolic and diastolic CHF (congestive heart failure) (HCC)   2. Hypertensive heart disease with heart failure (HCC)   3. Essential hypertension, malignant   4. NSVT (nonsustained ventricular tachycardia) (HCC)   5. Second degree AV block, Mobitz type I   6. OSA (obstructive sleep apnea)   7. Morbid obesity, unspecified obesity type (HCC)      PLAN:  In order of problems listed above:  1. CHF: He is probably still mildly  hypervolemic. Not very sure about his dry weight anymore. He has predominantly right heart failure symptoms, quite probably due to obesity and untreated obstructive sleep apnea. He also has left ventricular failure due to malignant hypertension. Discussed sodium restriction and weight monitoring again. Increase frequency of metolazone and increased dose of spironolactone. 2. HTNive heart disease. Recheck echo once we have consistently achieved good blood pressure control 3. Malignant hypertension: Blood pressure was much better at his last appointment. We'll increase the spironolactone to 50 mg daily as this will also provide some additional diuretic effect. He will stop taking potassium supplements. If his blood pressure still elevated at his next appointment, recommend adding a low dose of carvedilol. Sherryll Burger is another good option, may may be too expensive.  4. Second-degree AV block, Mobitz type I : Avoiding beta blockers at this time due to  bradycardia and second-degree Mobitz type I AV block noted during his previous hospitalization. These were likely related to obstructive sleep apnea. I think we can now  revisit the option to use beta blockers. Today's heart rate is much faster.  5. OSA now on CPAP 6. (Super) Morbid obesity:  in the long run, aggressive weight loss will be very important for health and longevity.    Medication Adjustments/Labs and Tests Ordered: Current medicines are reviewed at length with the patient today.  Concerns regarding medicines are outlined above.  Medication changes, Labs and Tests ordered today are listed in the Patient Instructions below. Patient Instructions  Medication Instructions:  1. Spironoalatone is increased from 25mg  to 50mg  once a day  2. Matolazone is increased to 3 times a week Tuesday, Thursday and Saturday  3. STOP Potassium   Labwork: None  Testing/Procedures: None  Follow-Up: Follow up with Tereso Newcomer PA-C in 6 weeks  Follow up with Dr Royann Shivers in 3 months   Any Other Special Instructions Will Be Listed Below (If Applicable). Requesting Doctors note    If you need a refill on your cardiac medications before your next appointment, please call your pharmacy.     Joie Bimler, MD  03/23/2016 12:16 PM    [facility] [address] [place], [place], [place]  [address] Phone: [phone]; Fax: [phone]

## 2016-04-11 ENCOUNTER — Telehealth: Payer: Self-pay | Admitting: Physician Assistant

## 2016-04-11 NOTE — Telephone Encounter (Signed)
VM left for patient to call back.  FMLA ready for pick up.

## 2016-04-13 ENCOUNTER — Encounter: Payer: Self-pay | Admitting: Cardiology

## 2016-04-19 ENCOUNTER — Ambulatory Visit: Payer: 59 | Admitting: Cardiology

## 2016-04-25 ENCOUNTER — Encounter: Payer: Self-pay | Admitting: Cardiology

## 2016-04-30 ENCOUNTER — Encounter: Payer: Self-pay | Admitting: *Deleted

## 2016-05-03 NOTE — Progress Notes (Signed)
Cardiology Office Note:    Date:  05/03/2016   ID:  Lubertha Basque, DOB 10-14-1981, MRN [id number]  PCP:  Jeanine Luz, FNP  Cardiologist:  Dr. Thurmon Fair   Electrophysiologist:  n/a  Referring MD: Veryl Speak, FNP   Chief Complaint  Patient presents with  . Congestive Heart Failure    Follow up    History of Present Illness:     Roger Ward is a 35 y.o. male with a hx of obesity, HTN, tobacco abuse. He was seen by cardiology in 3/16 during an admission for acute HF. Echo demonstrated diff HK with EF 40% and mild LVH. 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 Patient was therefore not placed on beta-blocker therapy. It was felt that beta-blocker should be avoided. It was suspected his bradycardia was related to severe OSA. He was started on nitrates, Hydralazine, ACE inhibitor. OP sleep study, Holter monitor and ETT nuclear stress test were recommended. He was lost to follow-up and either missed or canceled several appointments.  I saw him in 11/16 (first OV since admit to the hospital in 3/16). He was last seen 2/6 by me. I adjusted his diuretics further and placed him on Spironolactone. He returns for FU. His weight is down 17 lbs. His breathing is improved. LE edema is better. Denies orthopnea, PND. Denies chest pain, syncope. Denies cough. He has been wearing his CPAP x 1 week. He states he feels a lot better.    Past Medical History  Diagnosis Date  . Essential hypertension   . Family history of adverse reaction to anesthesia     mother and aunt  both put to sleep for surgery and "stopped breathing"  . Asthma     Childhood  . Chronic combined systolic and diastolic CHF (congestive heart failure) (HCC)     a. echo 03/06/15 showing mild LVH, EF 40%, high ventricular filling pressures, mod MR, mildly dilated RV/mildly reduced RV function, mildly dilated RA, PASP .  . OSA  (obstructive sleep apnea)     a. noncompliance with CPAP - wakes up with it off.  Marland Kitchen NSVT (nonsustained ventricular tachycardia) (HCC)     a. noted during 02/2015 admission.  . 2Nd degree AV block     a. noted during 02/2015 admission - second degree AV AV block (not further defined in notes). Not on BB due to this.  . Bradycardia     a. noted during 02/2015 admission - second degree AV AV block (not further defined in notes). Not on BB due to this.  . Diabetes mellitus, type 2 (HCC)   . Morbid obesity [facility])     Past Surgical History  Procedure Laterality Date  . No past surgeries      Current Medications: Outpatient Prescriptions Prior to Visit  Medication Sig Dispense Refill  . BIDIL 20-37.5 MG tablet TAKE 1 TABLET BY MOUTH 3 TIMES DAILY. 270 tablet 0  . furosemide (LASIX) 80 MG tablet Take 1 tablet (80 mg total) by mouth 2 (two) times daily. 180 tablet 1  . lisinopril (PRINIVIL,ZESTRIL) 20 MG tablet Take 1 tablet (20 mg total) by mouth 2 (two) times daily. 60 tablet 11  . metolazone (ZAROXOLYN) 2.5 MG tablet Take 1 tab (2.5) tab by mouth Tuesday, Thursday and Saturday 90 tablet 3  . spironolactone (ALDACTONE) 50 MG tablet Take 1 tablet (50 mg total) by mouth daily. 90 tablet 2  No facility-administered medications prior to visit.      Allergies:   Penicillins   Social History   Social History  . Marital Status: Married    Spouse Name: N/A  . Number of Children: 0  . Years of Education: 12   Occupational History  . Supervisor at [employer]    Social History Main Topics  . Smoking status: Former Smoker -- 0.25 packs/day for 19 years    Types: Cigarettes    Start date: 01/05/1996    Quit date: 03/04/2015  . Smokeless tobacco: Never Used     Comment: Already decreasaed smoking  . Alcohol Use: 0.6 - 1.2 oz/week    1-2 Standard drinks or equivalent per week     Comment: Occasional  . Drug Use: No  . Sexual Activity: Yes    Birth Control/ Protection: Condom    Other Topics Concern  . Not on file   Social History Narrative   Born and raised in [place]. Fun: Bowling, movies, we go   Denies religious beliefs that would effect health care.      Family History:  The patient's family history includes Breast cancer in his maternal grandmother; Colon cancer in his maternal uncle; Diabetes in his maternal grandmother; Heart failure in his mother; Hyperlipidemia in his mother; Hypertension in his mother; Multiple sclerosis in his mother.   ROS:   Please see the history of present illness.    ROS All other systems reviewed and are negative.   Physical Exam:    VS:  There were no vitals taken for this visit.   GEN: Well nourished, well developed, in no acute distress HEENT: normal Neck: no JVD, no masses Cardiac: Normal S1/S2, RRR; no murmurs, rubs, or gallops, no edema;   carotid bruits,   Respiratory:  clear to auscultation bilaterally; no wheezing, rhonchi or rales GI: soft, nontender, nondistended MS: no deformity or atrophy Skin: warm and dry Neuro: No focal deficits  Psych: Alert and oriented x 3, normal affect  Wt Readings from Last 3 Encounters:  03/22/16 356 lb 4 oz (161.594 kg)  02/08/16 349 lb 12.8 oz (158.668 kg)  01/23/16 366 lb 1.9 oz (166.071 kg)      Studies/Labs Reviewed:     EKG:  EKG is  ordered today.  The ekg ordered today demonstrates   Recent Labs: 10/10/2015: ALT 19 02/16/2016: BUN 9; Creat 0.90; Potassium 4.5; Sodium 139   Recent Lipid Panel    Component Value Date/Time   CHOL 166 03/06/2015 0155   TRIG 248* 03/06/2015 0155   HDL 42 03/06/2015 0155   CHOLHDL 4.0 03/06/2015 0155   VLDL 50* 03/06/2015 0155   LDLCALC 74 03/06/2015 0155    Additional studies/ records that were reviewed today include:    Holter 3/17  Normal sinus rhythm with normal circadian variation  Occasional PVCs. No complex atrial or ventricular arrhythmias  A few hours of the recording are not interpretable due to  artifact  Normal 24-hour Holter monitor. Specifically, no ventricular tachycardia seen.  Echo 3/16 Mild LVH, EF 40%, diffuse HK, moderate MR, mild LAE, mild RVE, mildly reduced RVSF, mild RAE, PASP 45 mmHg   ASSESSMENT:     No diagnosis found.  PLAN:     In order of problems listed above:  1. Chronic Combined Systolic and Diastolic CHF - Volume is improved. His weight is down 17 lbs since last seen. Continue Lasix, metolazone, Lisinopril, BiDil, Spironolactone.  - He will bring FMLA forms back  and I will fill them out (intermittent FMLA)  2. Dilated Cardiomyopathy - Probably related to HTN. Given his size, nuclear stress test would likely not yield useful information. We'll plan on repeating his echocardiogram once his heart failure medications are maximized and his sleep apnea is treated. If his EF is not normal at that point, consider further testing.   3. Hypertensive Heart Disease - Blood pressure now controlled. He just started Spironolactone a few days ago. Check BMET today and repeat in 1 week.   4. Bradycardia - Patient with tachycardia and bradycardia, NSVT, 2nd degree AVB in the hospital last year. He has evidence of conduction system disease with RBBB. Plan has been to get a Holter once his sleep apnea is adequately treated to reassess for evidence of AV block. - Get 48 Holter 1 week before next visit with Dr. Rachelle Hora Croitoru in 03/2016.  5. OSA: Continue CPAP. Follow-up with Dr. Armanda Magic as planned.    Medication Adjustments/Labs and Tests Ordered: Current medicines are reviewed at length with the patient today.  Concerns regarding medicines are outlined above.  Medication changes, Labs and Tests ordered today are outlined in the Patient Instructions noted below. There are no Patient Instructions on file for this visit. Signed, Tereso Newcomer, PA-C  05/03/2016 5:57 PM    [facility] [address] [place],  [place], [place]  [address] Phone: [phone]; Fax: [phone]     This encounter was created in error - please disregard.

## 2016-05-04 ENCOUNTER — Encounter: Payer: 59 | Admitting: Physician Assistant

## 2016-05-08 ENCOUNTER — Encounter: Payer: Self-pay | Admitting: Pulmonary Disease

## 2016-05-11 ENCOUNTER — Encounter: Payer: Self-pay | Admitting: Physician Assistant

## 2016-06-29 ENCOUNTER — Other Ambulatory Visit: Payer: Self-pay | Admitting: Cardiovascular Disease

## 2016-06-29 NOTE — Telephone Encounter (Signed)
Rx has been sent to the pharmacy electronically. ° °

## 2016-07-12 ENCOUNTER — Encounter (HOSPITAL_COMMUNITY): Payer: Self-pay | Admitting: Emergency Medicine

## 2016-07-12 ENCOUNTER — Ambulatory Visit (HOSPITAL_COMMUNITY)
Admission: EM | Admit: 2016-07-12 | Discharge: 2016-07-12 | Disposition: A | Discharge status: Home / Self Care | Payer: 59 | Attending: Family Medicine | Admitting: Family Medicine

## 2016-07-12 DIAGNOSIS — H109 Unspecified conjunctivitis: Secondary | ICD-10-CM

## 2016-07-12 MED ORDER — SULFACETAMIDE SODIUM 10 % OP SOLN: 1.0000 [drp] | OPHTHALMIC | 0 refills | Status: DC

## 2016-07-12 NOTE — ED Triage Notes (Signed)
The patient presented to the [facility] with a complaint of an irritation to his left eye x 4 days. The patient denied any known injury or foreign objects to the eye.

## 2016-07-12 NOTE — ED Provider Notes (Signed)
CSN: [id number]     Arrival date & time 07/12/16  1039 History   None    Chief Complaint  Patient presents with  . Eye Problem   (Consider location/radiation/quality/duration/timing/severity/associated sxs/prior Treatment) Patient c/o itching left eye for 4 days and c/o discharge and redness.   The history is provided by the patient.  Eye Problem  Location:  Left eye Quality:  Tearing and dull Severity:  Severe Onset quality:  Gradual Duration:  4 days Progression:  Worsening Chronicity:  New Relieved by:  None tried Ineffective treatments:  None tried Associated symptoms: crusting, discharge, itching, redness, swelling and tearing     Past Medical History:  Diagnosis Date  . 2Nd degree AV block    a. noted during 02/2015 admission - second degree AV AV block (not further defined in notes). Not on BB due to this.  . Asthma    Childhood  . Bradycardia    a. noted during 02/2015 admission - second degree AV AV block (not further defined in notes). Not on BB due to this.  . Chronic combined systolic and diastolic CHF (congestive heart failure) (HCC)    a. echo 03/06/15 showing mild LVH, EF 40%, high ventricular filling pressures, mod MR, mildly dilated RV/mildly reduced RV function, mildly dilated RA, PASP .  . Diabetes mellitus, type 2 (HCC)   . Essential hypertension   . Family history of adverse reaction to anesthesia    mother and aunt  both put to sleep for surgery and "stopped breathing"  . Morbid obesity (HCC)   . NSVT (nonsustained ventricular tachycardia) (HCC)    a. noted during 02/2015 admission.  . OSA (obstructive sleep apnea)    a. noncompliance with CPAP - wakes up with it off.   Past Surgical History:  Procedure Laterality Date  . NO PAST SURGERIES     Family History  Problem Relation Age of Onset  . Hypertension Mother   . Hyperlipidemia Mother   . Heart failure Mother   . Multiple sclerosis Mother   . Breast cancer Maternal Grandmother   .  Diabetes Maternal Grandmother   . Colon cancer Maternal Uncle    Social History  Substance Use Topics  . Smoking status: Former Smoker    Packs/day: 0.25    Years: 19.00    Types: Cigarettes    Start date: 01/05/1996    Quit date: 03/04/2015  . Smokeless tobacco: Never Used     Comment: Already decreasaed smoking  . Alcohol use 0.6 - 1.2 oz/week    1 - 2 Standard drinks or equivalent per week     Comment: Occasional    Review of Systems  Constitutional: Negative.   HENT: Negative.   Eyes: Positive for discharge, redness and itching.  Respiratory: Negative.   Cardiovascular: Negative.   Gastrointestinal: Negative.   Endocrine: Negative.   Genitourinary: Negative.   Musculoskeletal: Negative.   Skin: Negative.   Allergic/Immunologic: Negative.   Neurological: Negative.   Hematological: Negative.   Psychiatric/Behavioral: Negative.     Allergies  Penicillins  Home Medications   Prior to Admission medications   Medication Sig Start Date End Date Taking? Authorizing Provider  BIDIL 20-37.5 MG tablet TAKE 1 TABLET BY MOUTH 3 TIMES DAILY. 03/19/16  Yes Veryl Speak, FNP  furosemide (LASIX) 80 MG tablet TAKE 1 TABLET (80 MG TOTAL) BY MOUTH 2 (TWO) TIMES DAILY. 06/29/16  Yes Mihai Croitoru, MD  lisinopril (PRINIVIL,ZESTRIL) 20 MG tablet Take 1 tablet (20 mg total) by  mouth 2 (two) times daily. 01/23/16  Yes Scott T Alben Spittle, PA-C  lisinopril (PRINIVIL,ZESTRIL) 20 MG tablet Take 1 tablet (20 mg total) by mouth 2 (two) times daily. 06/29/16   Mihai Croitoru, MD  metolazone (ZAROXOLYN) 2.5 MG tablet Take 1 tab (2.5) tab by mouth Tuesday, Thursday and Saturday 03/22/16   Rachelle Hora Croitoru, MD  spironolactone (ALDACTONE) 50 MG tablet Take 1 tablet (50 mg total) by mouth daily. 03/22/16   Mihai Croitoru, MD   Meds Ordered and Administered this Visit  Medications - No data to display  BP 120/84 (BP Location: Left Arm)   Pulse 102   Temp 97.9 F (36.6 C) (Oral)   Resp 12   SpO2 95%  No  data found.   Physical Exam  Constitutional: He appears well-developed and well-nourished.  HENT:  Head: Normocephalic and atraumatic.  Eyes: Pupils are equal, round, and reactive to light. Left eye exhibits discharge.  Conjunctiva left eye injected and swollen.  Sclera left eye is injected.  Left periorbital swelling.  Neck: Normal range of motion. Neck supple.  Cardiovascular: Normal rate and regular rhythm.   Pulmonary/Chest: Effort normal and breath sounds normal.  Abdominal: Soft. Bowel sounds are normal.  Skin: Skin is warm and dry.  Nursing note and vitals reviewed.   Urgent Care Course   Clinical Course    Procedures (including critical care time)  Labs Review Labs Reviewed - No data to display  Imaging Review No results found.   Visual Acuity Review  Right Eye Distance:   Left Eye Distance:   Bilateral Distance:    Right Eye Near:   Left Eye Near:    Bilateral Near:         MDM  Conjunctivitis left eye - sulamyd eye gtt's 2 gtt's q 2 hours W/A #10 ml Follow up with PCP     Deatra Canter, FNP 07/12/16 1248

## 2016-12-07 ENCOUNTER — Encounter: Payer: Self-pay | Admitting: Physician Assistant

## 2016-12-07 ENCOUNTER — Ambulatory Visit (INDEPENDENT_AMBULATORY_CARE_PROVIDER_SITE_OTHER): Payer: Self-pay | Admitting: Physician Assistant

## 2016-12-07 VITALS — BP 171/111 | HR 103 | Ht 73.0 in | Wt 363.6 lb

## 2016-12-07 DIAGNOSIS — I5043 Acute on chronic combined systolic (congestive) and diastolic (congestive) heart failure: Secondary | ICD-10-CM

## 2016-12-07 DIAGNOSIS — G4733 Obstructive sleep apnea (adult) (pediatric): Secondary | ICD-10-CM

## 2016-12-07 DIAGNOSIS — I10 Essential (primary) hypertension: Secondary | ICD-10-CM

## 2016-12-07 MED ORDER — SPIRONOLACTONE 25 MG PO TABS: 25.0000 mg | ORAL_TABLET | Freq: Every day | ORAL | 3 refills | Status: DC

## 2016-12-07 MED ORDER — LISINOPRIL 20 MG PO TABS: 20.0000 mg | ORAL_TABLET | Freq: Two times a day (BID) | ORAL | 2 refills | Status: DC

## 2016-12-07 MED ORDER — FUROSEMIDE 80 MG PO TABS: ORAL_TABLET | ORAL | 2 refills | Status: DC

## 2016-12-07 MED ORDER — ISOSORB DINITRATE-HYDRALAZINE 20-37.5 MG PO TABS: 1.0000 | ORAL_TABLET | Freq: Three times a day (TID) | ORAL | 3 refills | Status: DC

## 2016-12-07 NOTE — Patient Instructions (Signed)
Your physician has recommended you make the following change in your medication:   1.) ALL of your medications have been restarted. They have been sent to [facility] [address]  The dosages are the same except for the spironalactone has been decreased from 50 mg daily to 25 mg daily. Continue to stay off of the Metolazone for now.  Your physician recommends that you return for lab work in: TODAY and again in 1 week.  Your physician recommends that you schedule a follow-up appointment in: 3 weeks with Lisabeth Devoid on a day Dr Royann Shivers is in the office.  Follow up with Dr Mayford Knife in 1-2 months for your CPAP/ sleep apnea. You are to contact your DME provider to request  New mask.

## 2016-12-07 NOTE — Progress Notes (Signed)
Cardiology Office Note    Date:  12/07/2016   ID:  Roger Ward, DOB 03-Feb-1981, MRN [id number]  PCP:  Jeanine Luz, FNP  Cardiologist:  Dr. Royann Shivers OSA: Dr. Mayford Knife  Chief Complaint  Patient presents with  . Follow-up    seen for Dr. Royann Shivers, PT C/O SOB, DOE, HASNT TAKEM IN IN A FEW MONTHS     History of Present Illness:  Roger Ward is a 35 y.o. male with super morbid obesity, malignant hypertension, OSA, biventricular failure, mild to moderate LV dysfunction with baseline EF 40%, moderate pulmonary artery hypertension due to cor pulmonale. He has NYHA class I-II exertional dyspnea and minimal lower extremity edema. He has been using CPAP. He was previously admitted in March 2016 for acute heart failure. Echocardiogram at that time demonstrated diffuse hypokinesis with EF 40%, mild LVH. It was felt his dilated cardiomyopathy is likely related to hypertension. He did not undergo a stress test due to his body size. He also had variations in heart rhythm including sinus bradycardia, sinus tachycardia, junctional rhythm, NSVT and second-degree AV block type II. He was therefore not placed on a beta blocker. It was suspected his bradycardia was related to OSA. He was started on spironolactone and had not had any gynecomastia. He did have an normal 24-hour Holter monitor in March 2017 that showed occasional PVCs, no complex atrial and ventricular arrhythmia. His last cardiology office visit was on 03/22/2016, he was noted to be hypertensive and mildly fluid overloaded at the time. Spironolactone was increased to 50 mg daily. Metolazone was increased to 3 times a week every Tuesday, Thursday and Saturday. Potassium was discontinued. He was supposed to follow-up in 6 weeks, however this did not appear to have happened. I also do not see any lab work after increasing on the metolazone.  He presents today for cardiology follow-up. Apparently he has not been taking any of his medication for at  least 2-3 month. When asked him why he did not take them, he gave a very vague answer about work. He says he has been accumulating fluid and also having some degree of shortness of breath with exertion. His work Armed forces technical officer. He denies any chest discomfort. I will restart BiDil, lisinopril, Lasix and spironolactone. I was restart the spironolactone at 25 mg daily before further titration, he was on 50 mg daily spironolactone before. His heart rate is mildly elevated today at 103. Surprisingly, his weight did not increase but much after starting the diuretic for several months, his weight is only 7 pounds higher than his last visit. I will hold off on restarting metolazone. I will obtain a basic metabolic panel today and in one week given reinitiation of high-dose diuretic. He also mentioned his CPAP mask has a crack in it and want a replacement mask. He has not been followed by Dr. Mayford Knife and although he says he has been compliant with CPAP, he also mentions he frequently does not tolerate the current pressure and take his CPAP off in the middle of the night. He will need a close follow-up with Dr. Mayford Knife for further management of OSA. I will see the patient back in 3-4 weeks for further management of his blood pressure medication. At some point in the future, we plan to repeat echocardiogram to reassess the ejection fraction, however he has to demonstrate he can be compliant with the current blood pressure medication and only after his blood pressure is well-controlled then we will consider repeat echocardiogram.  Past Medical History:  Diagnosis Date  . 2nd degree AV block    a. noted during 02/2015 admission - second degree AV AV block (not further defined in notes). Not on BB due to this.  . Asthma    Childhood  . Bradycardia    a. noted during 02/2015 admission - second degree AV AV block (not further defined in notes). Not on BB due to this.  . Chronic combined systolic and diastolic  CHF (congestive heart failure) (HCC)    a. echo 03/06/15 showing mild LVH, EF 40%, high ventricular filling pressures, mod MR, mildly dilated RV/mildly reduced RV function, mildly dilated RA, PASP 45mmHg.  . Diabetes mellitus, type 2 (HCC)   . Essential hypertension   . Family history of adverse reaction to anesthesia    mother and aunt  both put to sleep for surgery and "stopped breathing"  . Morbid obesity (HCC)   . NSVT (nonsustained ventricular tachycardia) (HCC)    a. noted during 02/2015 admission.  . OSA (obstructive sleep apnea)    a. noncompliance with CPAP - wakes up with it off.    Past Surgical History:  Procedure Laterality Date  . NO PAST SURGERIES      Current Medications: Outpatient Medications Prior to Visit  Medication Sig Dispense Refill  . metolazone (ZAROXOLYN) 2.5 MG tablet Take 1 tab (2.5) tab by mouth Tuesday, Thursday and Saturday (Patient not taking: Reported on 12/07/2016) 90 tablet 3  . sulfacetamide (BLEPH-10) 10 % ophthalmic solution Place 1-2 drops into the left eye every 2 (two) hours while awake. (Patient not taking: Reported on 12/07/2016) 10 mL 0  . BIDIL 20-37.5 MG tablet TAKE 1 TABLET BY MOUTH 3 TIMES DAILY. (Patient not taking: Reported on 12/07/2016) 270 tablet 0  . furosemide (LASIX) 80 MG tablet TAKE 1 TABLET (80 MG TOTAL) BY MOUTH 2 (TWO) TIMES DAILY. (Patient not taking: Reported on 12/07/2016) 180 tablet 2  . lisinopril (PRINIVIL,ZESTRIL) 20 MG tablet Take 1 tablet (20 mg total) by mouth 2 (two) times daily. (Patient not taking: Reported on 12/07/2016) 60 tablet 11  . lisinopril (PRINIVIL,ZESTRIL) 20 MG tablet Take 1 tablet (20 mg total) by mouth 2 (two) times daily. (Patient not taking: Reported on 12/07/2016) 180 tablet 2  . spironolactone (ALDACTONE) 50 MG tablet Take 1 tablet (50 mg total) by mouth daily. (Patient not taking: Reported on 12/07/2016) 90 tablet 2   No facility-administered medications prior to visit.      Allergies:    Penicillins   Social History   Social History  . Marital status: Married    Spouse name: N/A  . Number of children: 0  . Years of education: 12   Occupational History  . Supervisor at [employer]    Social History Main Topics  . Smoking status: Former Smoker    Packs/day: 0.25    Years: 19.00    Types: Cigarettes    Start date: 01/05/1996    Quit date: 03/04/2015  . Smokeless tobacco: Never Used     Comment: Already decreasaed smoking  . Alcohol use 0.6 - 1.2 oz/week    1 - 2 Standard drinks or equivalent per week     Comment: Occasional  . Drug use: No  . Sexual activity: Yes    Birth control/ protection: Condom   Other Topics Concern  . None   Social History Narrative   Born [place] raised in [place]. Fun: Bowling, movies, we go   Denies religious beliefs that would  effect health care.      Family History:  The patient's family history includes Breast cancer in his maternal grandmother; Colon cancer in his maternal uncle; Diabetes in his maternal grandmother; Heart failure in his mother; Hyperlipidemia in his mother; Hypertension in his mother; Multiple sclerosis in his mother.   ROS:   Please see the history of present illness.    ROS All other systems reviewed and are negative.   PHYSICAL EXAM:   VS:  BP (!) 171/111 (BP Location: Right Arm, Patient Position: Sitting, Cuff Size: Large)   Pulse (!) 103   Ht 6\' 1"  (1.854 m)   Wt (!) 363 lb 9.6 oz (164.9 kg)   SpO2 98%   BMI 47.97 kg/m    GEN: Well nourished, well developed, in no acute distress  HEENT: normal  Neck: no JVD, carotid bruits, or masses Cardiac: RRR; no murmurs, rubs, or gallops. 1+ pitting edema near bilateral ankles  Respiratory:  clear to auscultation bilaterally, normal work of breathing GI: soft, nontender, nondistended, + BS MS: no deformity or atrophy  Skin: warm and dry, no rash Neuro:  Alert and Oriented x 3, Strength and sensation are intact Psych: euthymic mood, full  affect  Wt Readings from Last 3 Encounters:  12/07/16 (!) 363 lb 9.6 oz (164.9 kg)  03/22/16 (!) 356 lb 4 oz (161.6 kg)  02/08/16 (!) 349 lb 12.8 oz (158.7 kg)      Studies/Labs Reviewed:   EKG:  EKG is ordered today.  The ekg ordered today demonstrates sinus tachycardia, right bundle branch block, heart rate 103.  Recent Labs: 02/16/2016: BUN 9; Creat 0.90; Potassium 4.5; Sodium 139   Lipid Panel    Component Value Date/Time   CHOL 166 03/06/2015 0155   TRIG 248 (H) 03/06/2015 0155   HDL 42 03/06/2015 0155   CHOLHDL 4.0 03/06/2015 0155   VLDL 50 (H) 03/06/2015 0155   LDLCALC 74 03/06/2015 0155    Additional studies/ records that were reviewed today include:   24 hour holter monitor 03/12/2016 Study Highlights    Normal sinus rhythm with normal circadian variation  Occasional PVCs. No complex atrial or ventricular arrhythmias  A few hours of the recording are not interpretable due to artifact   Normal 24-hour Holter monitor. Specifically, no ventricular tachycardia seen.      ASSESSMENT:    1. Acute on chronic systolic and diastolic heart failure, NYHA class 1 (HCC)   2. Essential hypertension, malignant   3. Morbid obesity (HCC)   4. OSA (obstructive sleep apnea)      PLAN:  In order of problems listed above:  1. Acute on chronic systolic and diastolic heart failure: Related to medication noncompliance, he has stopped essentially all this medication in the last 2-3 month. Surprisingly he did not accumulate as much fluid as I expected, his weight is only increased by 7 pounds since his last office visit in April. His lung is clear, majority of the fluid accumulation seems to be in the leg. - Original plan is to obtain a repeat echo at some point once his blood pressure is under control. Unfortunately given the fact that he has not taking his medication for several month, there would not be any point of doing an echocardiogram at this time. Defer until later once  he can demonstrate compliance with medication and better blood pressure control. - Obtain basic metabolic panel today and in 1 week for reassessment of his renal function given reinitiation of high-dose diuretic.  2. HTN: Uncontrolled blood pressure, will restart majority of his blood pressure medication today. Will restart the previous dose of spironolactone at half of the previous dose at 25 mg daily.  3. Morbid obesity: Ultimately he will need to lose weight in order to permanently improve his medical condition.  4. OSA: Although he says he has been compliant with his CPAP, however he also mentions he frequently take off his CPAP at night. He also mentions his mask has a crack on it and wanted replacement mask. He will need to follow-up with Dr. Mayford Knife for further adjustment of his CPAP setting.   Medication Adjustments/Labs and Tests Ordered: Current medicines are reviewed at length with the patient today.  Concerns regarding medicines are outlined above.  Medication changes, Labs and Tests ordered today are listed in the Patient Instructions below. Patient Instructions  Your physician has recommended you make the following change in your medication:   1.) ALL of your medications have been restarted. They have been sent to [facility] [address]  The dosages are the same except for the spironalactone has been decreased from 50 mg daily to 25 mg daily. Continue to stay off of the Metolazone for now.  Your physician recommends that you return for lab work in: TODAY and again in 1 week.  Your physician recommends that you schedule a follow-up appointment in: 3 weeks with Lisabeth Devoid on a day Dr Royann Shivers is in the office.  Follow up with Dr Mayford Knife in 1-2 months for your CPAP/ sleep apnea. You are to contact your DME provider to request  New mask.       Ramond Dial, [place]  12/07/2016 11:26 PM    [facility] [address] [place], [place], [place]  [address] Phone: [phone]; Fax: [phone]

## 2016-12-28 ENCOUNTER — Ambulatory Visit (INDEPENDENT_AMBULATORY_CARE_PROVIDER_SITE_OTHER): Payer: Managed Care, Other (non HMO) | Admitting: Physician Assistant

## 2016-12-28 ENCOUNTER — Encounter: Payer: Self-pay | Admitting: Physician Assistant

## 2016-12-28 VITALS — BP 140/96 | HR 104 | Ht 73.0 in | Wt 357.0 lb

## 2016-12-28 DIAGNOSIS — I5042 Chronic combined systolic (congestive) and diastolic (congestive) heart failure: Secondary | ICD-10-CM | POA: Diagnosis not present

## 2016-12-28 DIAGNOSIS — G4733 Obstructive sleep apnea (adult) (pediatric): Secondary | ICD-10-CM | POA: Diagnosis not present

## 2016-12-28 DIAGNOSIS — Z79899 Other long term (current) drug therapy: Secondary | ICD-10-CM

## 2016-12-28 DIAGNOSIS — I10 Essential (primary) hypertension: Secondary | ICD-10-CM

## 2016-12-28 LAB — BASIC METABOLIC PANEL
BUN: 9 mg/dL (ref 7–25)
CHLORIDE: 104 mmol/L (ref 98–110)
CO2: 28 mmol/L (ref 20–31)
CREATININE: 0.92 mg/dL (ref 0.60–1.35)
Calcium: 9.3 mg/dL (ref 8.6–10.3)
Glucose, Bld: 94 mg/dL (ref 65–99)
POTASSIUM: 4.3 mmol/L (ref 3.5–5.3)
SODIUM: 139 mmol/L (ref 135–146)

## 2016-12-28 MED ORDER — SPIRONOLACTONE 50 MG PO TABS: 50.0000 mg | ORAL_TABLET | Freq: Every day | ORAL | 3 refills | Status: DC

## 2016-12-28 MED ORDER — AMLODIPINE BESYLATE 10 MG PO TABS: 10.0000 mg | ORAL_TABLET | Freq: Every day | ORAL | 11 refills | Status: DC

## 2016-12-28 NOTE — Progress Notes (Signed)
Cardiology Office Note    Date:  12/29/2016   ID:  [name], DOB 09-14-1981, MRN [id number]  PCP:  Roger Luz, FNP  Cardiologist:  Dr. Royann Ward  OSA: Dr. Mayford Ward  Chief Complaint  Patient presents with  . Follow-up    followup for Dr. Royann Ward, combined chronic systolic and diastolic heart failure    History of Present Illness:  Roger Ward is a 36 y.o. male with super morbid obesity, malignant hypertension, OSA, biventricular failure, mild to moderate LV dysfunction with baseline EF 40%, moderate pulmonary artery hypertension due to cor pulmonale. He has NYHA class I-II exertional dyspnea and minimal lower extremity edema. He has been using CPAP. He was previously admitted in March 2016 for acute heart failure. Echocardiogram at that time demonstrfoated diffuse hypokinesis with EF 40%, mild LVH. It was felt his dilated cardiomyopathy is likely related to hypertension. He did not undergo a stress test due to his body size. He also had variations in heart rhythm including sinus bradycardia, sinus tachycardia, junctional rhythm, NSVT and second-degree AV block type II. He was therefore not placed on a beta blocker. It was suspected his bradycardia was related to OSA. He was started on spironolactone and had not had any gynecomastia. He did have an normal 24-hour Holter monitor in March 2017 that showed occasional PVCs, no complex atrial and ventricular arrhythmia. His last cardiology office visit was on 03/22/2016, he was noted to be hypertensive and mildly fluid overloaded at the time. Spironolactone was increased to 50 mg daily. Metolazone was increased to 3 times a week every Tuesday, Thursday and Saturday. Potassium was discontinued. He was supposed to follow-up in 6 weeks, however this did not appear to have happened. I also do not see any lab work after increasing on the metolazone.  I last saw the patient on 12/05/2014. He had not taking his any of his medication for at least 2-3  month at the time. He also has been accumulating fluid and some degree of shortness breath with exertion. 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 He denies any chest pain. I restarted on the BiDil, lisinopril, Lasix, and spironolactone. I did not restart on the metolazone as he was largely euvolemic and his heart weight was only 7 pounds higher than his last visit. He also mentioned his sleep apnea mask had a crack on it and wished to have a replacement mask. He frequently does not tolerate the current pressure of the CPAP, I recommended close follow-up with Dr. Mayford Ward for further management.   He presents today for follow-up, his blood pressure continued to be elevated. However he is volume status seems to be improving. He is no longer having significant lower extremity edema. He denies any chest discomfort or shortness of breath from his baseline. I will add amlodipine 10 mg to his medical regimen, I have also increased his spironolactone to 50 mg daily. As he has been doing well from cardiology perspective. We were unable to add beta blocker due to history of second-degree AV block. At some point in the future, we plan for repeat echocardiogram to reassess ejection fraction, however he has to demonstrate compliance with blood pressure medication first.   Past Medical History:  Diagnosis Date  . 2nd degree AV block    a. noted during 02/2015 admission - second degree AV AV block (not further defined in notes). Not on BB due to this.  . Asthma    Childhood  . Bradycardia  a. noted during 02/2015 admission - second degree AV AV block (not further defined in notes). Not on BB due to this.  . Chronic combined systolic and diastolic CHF (congestive heart failure) (HCC)    a. echo 03/06/15 showing mild LVH, EF 40%, high ventricular filling pressures, mod MR, mildly dilated RV/mildly reduced RV function, mildly dilated RA, PASP .  . Diabetes mellitus, type 2 (HCC)   . Essential hypertension   .  Family history of adverse reaction to anesthesia    mother and aunt  both put to sleep for surgery and "stopped breathing"  . Morbid obesity (HCC)   . NSVT (nonsustained ventricular tachycardia) (HCC)    a. noted during 02/2015 admission.  . OSA (obstructive sleep apnea)    a. noncompliance with CPAP - wakes up with it off.    Past Surgical History:  Procedure Laterality Date  . NO PAST SURGERIES      Current Medications: Outpatient Medications Prior to Visit  Medication Sig Dispense Refill  . furosemide (LASIX) 80 MG tablet TAKE 1 TABLET (80 MG TOTAL) BY MOUTH 2 (TWO) TIMES DAILY. 180 tablet 2  . isosorbide-hydrALAZINE (BIDIL) 20-37.5 MG tablet Take 1 tablet by mouth 3 (three) times daily. 270 tablet 3  . lisinopril (PRINIVIL,ZESTRIL) 20 MG tablet Take 1 tablet (20 mg total) by mouth 2 (two) times daily. 180 tablet 2  . metolazone (ZAROXOLYN) 2.5 MG tablet Take 1 tab (2.5) tab by mouth Tuesday, Thursday and Saturday 90 tablet 3  . sulfacetamide (BLEPH-10) 10 % ophthalmic solution Place 1-2 drops into the left eye every 2 (two) hours while awake. 10 mL 0  . spironolactone (ALDACTONE) 25 MG tablet Take 1 tablet (25 mg total) by mouth daily. 90 tablet 3   No facility-administered medications prior to visit.      Allergies:   Penicillins   Social History   Social History  . Marital status: Married    Spouse name: N/A  . Number of children: 0  . Years of education: 12   Occupational History  . Supervisor at [employer]    Social History Main Topics  . Smoking status: Former Smoker    Packs/day: 0.25    Years: 19.00    Types: Cigarettes    Start date: 01/05/1996    Quit date: 03/04/2015  . Smokeless tobacco: Never Used     Comment: Already decreasaed smoking  . Alcohol use 0.6 - 1.2 oz/week    1 - 2 Standard drinks or equivalent per week     Comment: Occasional  . Drug use: No  . Sexual activity: Yes    Birth control/ protection: Condom   Other Topics Concern    . None   Social History Narrative   Born and raised in [place]. Fun: Bowling, movies, we go   Denies religious beliefs that would effect health care.      Family History:  The patient's family history includes Breast cancer in his maternal grandmother; Colon cancer in his maternal uncle; Diabetes in his maternal grandmother; Heart failure in his mother; Hyperlipidemia in his mother; Hypertension in his mother; Multiple sclerosis in his mother.   ROS:   Please see the history of present illness.    ROS All other systems reviewed and are negative.   PHYSICAL EXAM:   VS:  BP (!) 140/96 (BP Location: Right Arm)   Pulse (!) 104   Ht 6\' 1"  (1.854 m)   Wt (!) 357 lb (161.9 kg)  BMI 47.10 kg/m    GEN: Well nourished, well developed, in no acute distress  HEENT: normal  Neck: no JVD, carotid bruits, or masses Cardiac: RRR; no murmurs, rubs, or gallops,no edema  Respiratory:  clear to auscultation bilaterally, normal work of breathing GI: soft, nontender, nondistended, + BS MS: no deformity or atrophy  Skin: warm and dry, no rash Neuro:  Alert and Oriented x 3, Strength and sensation are intact Psych: euthymic mood, full affect  Wt Readings from Last 3 Encounters:  12/28/16 (!) 357 lb (161.9 kg)  12/07/16 (!) 363 lb 9.6 oz (164.9 kg)  03/22/16 (!) 356 lb 4 oz (161.6 kg)      Studies/Labs Reviewed:   EKG:  EKG is not ordered today.    Recent Labs: 12/28/2016: BUN 9; Creat 0.92; Potassium 4.3; Sodium 139   Lipid Panel    Component Value Date/Time   CHOL 166 03/06/2015 0155   TRIG 248 (H) 03/06/2015 0155   HDL 42 03/06/2015 0155   CHOLHDL 4.0 03/06/2015 0155   VLDL 50 (H) 03/06/2015 0155   LDLCALC 74 03/06/2015 0155    Additional studies/ records that were reviewed today include:   24 hour holter monitor 03/12/2016 Study Highlights    Normal sinus rhythm with normal circadian variation  Occasional PVCs. No complex atrial or ventricular arrhythmias  A few  hours of the recording are not interpretable due to artifact  Normal 24-hour Holter monitor. Specifically, no ventricular tachycardia seen.       ASSESSMENT:    1. Medication management   2. Chronic combined systolic and diastolic CHF (congestive heart failure) (HCC)   3. Morbid obesity (HCC)   4. OSA (obstructive sleep apnea)   5. Essential hypertension      PLAN:  In order of problems listed above:  1. Combined systolic and diastolic heart failure: Euvolemic on physical exam today after restarted on the Lasix. We'll not initiate metolazone at this point as he appears to be euvolemic.  2. Hypertension: Blood pressure continued to be high, will increase spironolactone to 50 mg daily and add amlodipine 10 mg daily. Once blood pressure is controlled, may consider repeat echocardiogram.  3. OSA: Management by Dr. Mayford Ward, will arrange 2 month follow-up.  4. Morbid obesty: Ultimately, will need weight loss permanently improve his medical condition.    Medication Adjustments/Labs and Tests Ordered: Current medicines are reviewed at length with the patient today.  Concerns regarding medicines are outlined above.  Medication changes, Labs and Tests ordered today are listed in the Patient Instructions below. Patient Instructions  Medication Instructions:  START- Amlodipine 10 mg daily INCREASE- Spironolactone 50 mg daily  Labwork: BMP Today  Testing/Procedures: None Ordered  Follow-Up: Your physician recommends that you schedule a follow-up appointment in: 2 Months with Dr Roger Ward  Your physician recommends that you schedule a follow-up appointment in: 3 Months with Dr Roger Ward   Any Other Special Instructions Will Be Listed Below (If Applicable).   If you need a refill on your cardiac medications before your next appointment, please call your pharmacy.      Ramond Dial, [place]  12/29/2016 1:32 AM    [facility] [address] [place],  [place], [place]  [address] Phone: [phone]; Fax: [phone]

## 2016-12-28 NOTE — Patient Instructions (Signed)
Medication Instructions:  START- Amlodipine 10 mg daily INCREASE- Spironolactone 50 mg daily  Labwork: BMP Today  Testing/Procedures: None Ordered  Follow-Up: Your physician recommends that you schedule a follow-up appointment in: 2 Months with Dr Mayford Knife  Your physician recommends that you schedule a follow-up appointment in: 3 Months with Dr Royann Shivers   Any Other Special Instructions Will Be Listed Below (If Applicable).   If you need a refill on your cardiac medications before your next appointment, please call your pharmacy.

## 2016-12-29 ENCOUNTER — Encounter: Payer: Self-pay | Admitting: Physician Assistant

## 2017-02-15 ENCOUNTER — Encounter: Payer: Self-pay | Admitting: Cardiology

## 2017-02-20 ENCOUNTER — Encounter: Payer: Self-pay | Admitting: Cardiology

## 2017-02-25 ENCOUNTER — Ambulatory Visit: Payer: Managed Care, Other (non HMO) | Admitting: Cardiology

## 2017-02-26 ENCOUNTER — Ambulatory Visit: Payer: Managed Care, Other (non HMO) | Admitting: Cardiology

## 2017-03-06 ENCOUNTER — Telehealth: Payer: Self-pay | Admitting: *Deleted

## 2017-03-06 NOTE — Telephone Encounter (Signed)
-----   Message from Quintella Reichert, MD sent at 03/03/2017  2:45 PM EDT ----- AHI to high - please find out what mask he is using and if it is leaking and if he is sleeping on his back

## 2017-03-06 NOTE — Telephone Encounter (Signed)
Called the patient with results left a message to call back 

## 2017-04-04 ENCOUNTER — Encounter: Payer: Self-pay | Admitting: Cardiology

## 2017-04-09 ENCOUNTER — Ambulatory Visit: Payer: Managed Care, Other (non HMO) | Admitting: Cardiology

## 2017-04-10 ENCOUNTER — Telehealth: Payer: Self-pay | Admitting: *Deleted

## 2017-04-10 NOTE — Telephone Encounter (Signed)
Called the patient LM TCB.

## 2017-04-12 NOTE — Telephone Encounter (Signed)
Spoke to the patient to follow up on his missed appointment on 04/09/17 with Dr. Mayford Knife. He went to our [facility] office by mistake and as a result he was rescheduled for a  5/15//18 appointment. The patient has never been seen for his 10 week sleep follow up appointment. He was set up for cpap on February 10th 2017. When I spoke to [facility] today Sharyl Nimrod states they informed him in march of 2017 about his 10 week sleep follow up. After June 29 2016 his account was put on hold/private pay and billing was stopped. Sharyl Nimrod [facility]) talked to Misty Stanley (compliance dept.) to see what the next step should be and Misty Stanley states as long as he has been compliant and keeps his 04/30/17 appointment they will keep him on as a client and give him his supplies. Sharyl Nimrod checked [employer] for patient compliance and the patient has been compliant. I  reached back out to the patient and encouraged him to keep his appointment on May 15th. Patient understands he needs to keep this appointment.

## 2017-04-29 NOTE — Progress Notes (Signed)
Cardiology Office Note    Date:  04/30/2017   ID:  Roger Ward, DOB 08/01/1981, MRN [id number]  PCP:  Veryl Speak, FNP  Cardiologist:  Thurmon Fair, MD  Chief Complaint  Patient presents with  . Sleep Apnea  . Hypertension    History of Present Illness:  Roger Ward is a 36 y.o. male with a history of HTN, CHF and bradycardia with AV block who also has OSA but has been never followed up for his BiPAP device.  Prior to going on BiPAP he had significant daytime sleepiness, severe snoring and witnessed apnea.  He says that he was diagnosed with severe OSA with an AHI of 100/hr and was started on BiPAP at 23/19cm H2O but never followed up. In 01/2016.   He has been using the device nightly. He uses a full face mask and needs a new mask and supplies. He says that his mask has been leaking.  He tolerates his mask and feels the pressure is adequate.  Since going on BiPAP, he feels more rested in the am with less daytime sleepiness.  He no longer snores.  He sleeps well at night.  He denies any mouth dryness.    Past Medical History:  Diagnosis Date  . 2nd degree AV block    a. noted during 02/2015 admission - second degree AV AV block (not further defined in notes). Not on BB due to this.  . Asthma    Childhood  . Bradycardia    a. noted during 02/2015 admission - second degree AV AV block (not further defined in notes). Not on BB due to this.  . Chronic combined systolic and diastolic CHF (congestive heart failure) (HCC)    a. echo 03/06/15 showing mild LVH, EF 40%, high ventricular filling pressures, mod MR, mildly dilated RV/mildly reduced RV function, mildly dilated RA, PASP .  . Diabetes mellitus, type 2 (HCC)   . Essential hypertension   . Family history of adverse reaction to anesthesia    mother and aunt  both put to sleep for surgery and "stopped breathing"  . Morbid obesity (HCC)   . NSVT (nonsustained ventricular tachycardia) (HCC)    a. noted during 02/2015  admission.  . OSA (obstructive sleep apnea)    a. noncompliance with CPAP - wakes up with it off.    Past Surgical History:  Procedure Laterality Date  . NO PAST SURGERIES      Current Medications: Current Meds  Medication Sig  . furosemide (LASIX) 80 MG tablet TAKE 1 TABLET (80 MG TOTAL) BY MOUTH 2 (TWO) TIMES DAILY.  . isosorbide-hydrALAZINE (BIDIL) 20-37.5 MG tablet Take 1 tablet by mouth 3 (three) times daily.  Marland Kitchen lisinopril (PRINIVIL,ZESTRIL) 20 MG tablet Take 1 tablet (20 mg total) by mouth 2 (two) times daily.  . metolazone (ZAROXOLYN) 2.5 MG tablet Take 1 tab (2.5) tab by mouth Tuesday, Thursday and Saturday  . sulfacetamide (BLEPH-10) 10 % ophthalmic solution Place 1-2 drops into the left eye every 2 (two) hours while awake.    Allergies:   Penicillins   Social History   Social History  . Marital status: Married    Spouse name: N/A  . Number of children: 0  . Years of education: 12   Occupational History  . Supervisor at [employer]    Social History Main Topics  . Smoking status: Former Smoker    Packs/day: 0.25    Years: 19.00    Types: Cigarettes  Start date: 01/05/1996    Quit date: 03/04/2015  . Smokeless tobacco: Never Used     Comment: Already decreasaed smoking  . Alcohol use 0.6 - 1.2 oz/week    1 - 2 Standard drinks or equivalent per week     Comment: Occasional  . Drug use: No  . Sexual activity: Yes    Birth control/ protection: Condom   Other Topics Concern  . None   Social History Narrative   Born and raised in [place]. Fun: Bowling, movies, we go   Denies religious beliefs that would effect health care.      Family History:  The patient's family history includes Breast cancer in his maternal grandmother; Colon cancer in his maternal uncle; Diabetes in his maternal grandmother; Heart failure in his mother; Hyperlipidemia in his mother; Hypertension in his mother; Multiple sclerosis in his mother.   ROS:   Please see the  history of present illness.    ROS All other systems reviewed and are negative.  No flowsheet data found.     PHYSICAL EXAM:   VS:  BP (!) 156/88   Pulse (!) 106   Ht 6\' 1"  (1.854 m)   Wt (!) 358 lb (162.4 kg)   SpO2 96%   BMI 47.23 kg/m    GEN: Well nourished, well developed, in no acute distress  HEENT: normal  Neck: no JVD, carotid bruits, or masses Cardiac: RRR; no murmurs, rubs, or gallops,no edema.  Intact distal pulses bilaterally.  Respiratory:  clear to auscultation bilaterally, normal work of breathing GI: soft, nontender, nondistended, + BS MS: no deformity or atrophy  Skin: warm and dry, no rash Neuro:  Alert and Oriented x 3, Strength and sensation are intact Psych: euthymic mood, full affect  Wt Readings from Last 3 Encounters:  04/30/17 (!) 358 lb (162.4 kg)  12/28/16 (!) 357 lb (161.9 kg)  12/07/16 (!) 363 lb 9.6 oz (164.9 kg)      Studies/Labs Reviewed:   EKG:  EKG is not ordered today.    Recent Labs: 12/28/2016: BUN 9; Creat 0.92; Potassium 4.3; Sodium 139   Lipid Panel    Component Value Date/Time   CHOL 166 03/06/2015 0155   TRIG 248 (H) 03/06/2015 0155   HDL 42 03/06/2015 0155   CHOLHDL 4.0 03/06/2015 0155   VLDL 50 (H) 03/06/2015 0155   LDLCALC 74 03/06/2015 0155    Additional studies/ records that were reviewed today include:  CPAP download    ASSESSMENT:    1. OSA (obstructive sleep apnea)   2. Benign essential HTN   3. Morbid obesity (HCC)      PLAN:  In order of problems listed above:  OSA - the patient is tolerating PAP therapy well without any problems. The PAP download was reviewed today and showed an AHI of 31.7/hr on 23/19 cm H2O with 90% compliance in using more than 4 hours nightly.  The patient has been using and benefiting from CPAP use and will continue to benefit from therapy. He has evidence of large mask leak which is likely skewing the AHI. I will get a new mask for him and then repeat a download in 4 weeks.     HTN - BP is elevated on exam today.  He has not taken his meds this am.  He will continue on amlodipine, ACE I and aldactone.  I will get a 24 hour BP monitor to assess adequate BP control.   Morbid obesity - I have encouraged  him to get into a routine exercise program and cut back on carbs and portions.      Medication Adjustments/Labs and Tests Ordered: Current medicines are reviewed at length with the patient today.  Concerns regarding medicines are outlined above.  Medication changes, Labs and Tests ordered today are listed in the Patient Instructions below.  There are no Patient Instructions on file for this visit.   Signed, Armanda Magic, MD  04/30/2017 7:59 AM    [facility] [address] [place], [place], [place]  [address] Phone: [phone]; Fax: [phone]

## 2017-04-30 ENCOUNTER — Ambulatory Visit (INDEPENDENT_AMBULATORY_CARE_PROVIDER_SITE_OTHER): Payer: Managed Care, Other (non HMO) | Admitting: Cardiology

## 2017-04-30 ENCOUNTER — Encounter: Payer: Self-pay | Admitting: Cardiology

## 2017-04-30 ENCOUNTER — Encounter (INDEPENDENT_AMBULATORY_CARE_PROVIDER_SITE_OTHER): Payer: Self-pay

## 2017-04-30 VITALS — BP 156/88 | HR 106 | Ht 73.0 in | Wt 358.0 lb

## 2017-04-30 DIAGNOSIS — I10 Essential (primary) hypertension: Secondary | ICD-10-CM | POA: Diagnosis not present

## 2017-04-30 DIAGNOSIS — G4733 Obstructive sleep apnea (adult) (pediatric): Secondary | ICD-10-CM

## 2017-04-30 NOTE — Patient Instructions (Signed)
Medication Instructions:  Your physician recommends that you continue on your current medications as directed. Please refer to the Current Medication list given to you today.   Labwork: None  Testing/Procedures: Dr. Mayford Knife recommends you wear a 24 HOUR BLOOD PRESSURE MONITOR.  Follow-Up: Your physician recommends that you schedule a follow-up appointment in 3 MONTHS with Dr. Mayford Knife.  Any Other Special Instructions Will Be Listed Below (If Applicable). Dr. Mayford Knife has ordered new supplies and a new mask for your BiPAP. Please call our PAP Assistant, Coralee North, if you do not hear from your medical equipment company within the next few days. Her direct phone number is [phone].    If you need a refill on your cardiac medications before your next appointment, please call your pharmacy.

## 2017-05-03 ENCOUNTER — Telehealth: Payer: Self-pay | Admitting: *Deleted

## 2017-05-03 NOTE — Telephone Encounter (Signed)
-----   Message from Henrietta Dine, RN sent at 04/30/2017 10:02 AM EDT ----- Regarding: new BiPAP orders [facility] orders are in  [place]- please pull a download 4 weeks after he gets his new mask  Thanks, ladies!

## 2017-05-17 ENCOUNTER — Ambulatory Visit (INDEPENDENT_AMBULATORY_CARE_PROVIDER_SITE_OTHER): Payer: Managed Care, Other (non HMO)

## 2017-05-17 DIAGNOSIS — I10 Essential (primary) hypertension: Secondary | ICD-10-CM | POA: Diagnosis not present

## 2017-05-27 ENCOUNTER — Encounter: Payer: Self-pay | Admitting: Cardiology

## 2017-05-28 NOTE — Telephone Encounter (Signed)
Download printed sent to scan 

## 2017-05-29 ENCOUNTER — Telehealth: Payer: Self-pay | Admitting: *Deleted

## 2017-05-29 NOTE — Telephone Encounter (Signed)
Informed patient of compliance results and patient understanding was verbalized. Patient states "yes he did get his new mask about a month ago but somehow the mask got a big slit cut in the rubber seal that fits around his mouth causing the leak. Patient states he will try to purchase a new mask this week if they are not too expensive.

## 2017-05-29 NOTE — Telephone Encounter (Signed)
-----   Message from Quintella Reichert, MD sent at 05/29/2017 12:53 PM EDT ----- Patient continues to have a large mask lead - please find out if he got a new mask

## 2017-07-01 ENCOUNTER — Telehealth: Payer: Self-pay | Admitting: Cardiology

## 2017-07-01 DIAGNOSIS — Z79899 Other long term (current) drug therapy: Secondary | ICD-10-CM

## 2017-07-01 NOTE — Telephone Encounter (Signed)
Patient reports BLE swelling for about 3 days. He does not weigh daily, but he says his pants and shoes still fit. He can NOT wear his socks, however.  He states he strictly limits his salt and denies going out to eat recently.  He states elevating his legs does make the swelling a little better. He does not have compression stockings to wear. The patient has no VS to report. He has no other complaints. He denies SOB. Confirmed with patient he is taking his medications as directed. He understands he will be called with recommendations.   To Dr. Royann Shivers.

## 2017-07-01 NOTE — Telephone Encounter (Signed)
New message      Pt c/o swelling: STAT is pt has developed SOB within 24 hours  1. How long have you been experiencing swelling?  Noticed it a few days ago 2. Where is the swelling located?  Feet, ankles and abdomen 3.  Are you currently taking a "fluid pill"?yes----furosemide 80mg ---1 tab bid 4.  Are you currently SOB?  no 5.  Have you traveled recently?

## 2017-07-02 MED ORDER — SPIRONOLACTONE 50 MG PO TABS: 50.0000 mg | ORAL_TABLET | Freq: Every day | ORAL | 3 refills | Status: DC

## 2017-07-02 MED ORDER — METOLAZONE 2.5 MG PO TABS: ORAL_TABLET | ORAL | 3 refills | Status: DC

## 2017-07-02 NOTE — Telephone Encounter (Signed)
New Message ° ° ° °Pt is returning call  °

## 2017-07-02 NOTE — Telephone Encounter (Signed)
Spoke with pt he states that he IS NOT taking his metolazone or the spironolactone, he does not know how long it has been(he ran out of medication) but promised that he will go pick it up from the pharmacy tonight and take both tonight and weigh daily and come in for blood draw on Friday and let us know if restarting medication helps Refill sent to pharmacy

## 2017-07-02 NOTE — Telephone Encounter (Signed)
Please confirm that he is also taking the other diuretics as prescribed - metolazone 2.5 mg three times a week and spironolactone daily. If the answer is yes, then ask him to take the metolazone every day this week, but no longer than that. He can go back to the original Rx after swelling is gone. If he is not taking the metolazone three days a week he should do so. Either way,  have labs checked Friday morning: BMET, Mg, BNP. He needs to weigh EVERY morning and keep a written log of his weights. Thanks, [employer]

## 2017-07-02 NOTE — Telephone Encounter (Signed)
LMTCB

## 2017-07-30 NOTE — Telephone Encounter (Signed)
Patient has not been able to purchase a new mask yet. Patient is still using his machine even though he has a large cut in his mask until he can get a new one. Informed patient I will call [facility] to inquire when he will be eligible for a new mask.

## 2017-07-31 NOTE — Telephone Encounter (Signed)
Spoke to [facility]) who states the patient is in collections and is eligible for a new mask in November 2018.

## 2017-08-07 ENCOUNTER — Ambulatory Visit (HOSPITAL_COMMUNITY)
Admission: EM | Admit: 2017-08-07 | Discharge: 2017-08-07 | Disposition: A | Discharge status: Home / Self Care | Payer: Managed Care, Other (non HMO) | Attending: Family Medicine | Admitting: Family Medicine

## 2017-08-07 ENCOUNTER — Encounter (HOSPITAL_COMMUNITY): Payer: Self-pay | Admitting: Emergency Medicine

## 2017-08-07 DIAGNOSIS — R05 Cough: Secondary | ICD-10-CM

## 2017-08-07 DIAGNOSIS — J069 Acute upper respiratory infection, unspecified: Secondary | ICD-10-CM

## 2017-08-07 MED ORDER — CHLORPHENIRAMINE-DM 4-30 MG PO TABS: ORAL_TABLET | ORAL | 0 refills | Status: DC

## 2017-08-07 MED ORDER — CETIRIZINE HCL 10 MG PO TABS: 10.0000 mg | ORAL_TABLET | Freq: Every day | ORAL | 0 refills | Status: DC

## 2017-08-07 MED ORDER — FLUTICASONE PROPIONATE 50 MCG/ACT NA SUSP: 1.0000 | Freq: Every day | NASAL | 5 refills | Status: DC

## 2017-08-07 NOTE — Discharge Instructions (Signed)
Due to onset this may be the beginning of of upper respiratory infection. This is likely viral and would provide symptomatic management with cough medication,. This also may be in part due to allergies I have also provided Zyrtec and Flonase to help with that possible component. Your symptoms may continue for the next couple of days but should improve in 3-4 days total

## 2017-08-07 NOTE — ED Triage Notes (Signed)
Pt reports cough, nasal congestion and nasal drainage since yesterday.  Pt has not taken any OTC medications.  Denies any fever.

## 2017-08-07 NOTE — ED Provider Notes (Signed)
[facility]    CSN: [id number] Arrival date & time: 08/07/17  4098     History   Chief Complaint Chief Complaint  Patient presents with  . URI    HPI Roger Ward is a 36 y.o. male.   HPI  Presenting with one-day history of cough and congestion. He indicates that he has had a history of seasonal allergies in the past though nothing recently. He denies any fevers any nausea or vomiting any other symptoms. He denies any shortness of breath. He does indicate that he has sleep apnea and uses his CPAP at night. Patient does have a history of heart failure though takes his medications regularly and follows up with his primary care physician. No new onset of leg swelling. No history of reflux symptoms, no association with big meals.  Past Medical History:  Diagnosis Date  . 2nd degree AV block    a. noted during 02/2015 admission - second degree AV AV block (not further defined in notes). Not on BB due to this.  . Asthma    Childhood  . Bradycardia    a. noted during 02/2015 admission - second degree AV AV block (not further defined in notes). Not on BB due to this.  . Chronic combined systolic and diastolic CHF (congestive heart failure) (HCC)    a. echo 03/06/15 showing mild LVH, EF 40%, high ventricular filling pressures, mod MR, mildly dilated RV/mildly reduced RV function, mildly dilated RA, PASP .  . Diabetes mellitus, type 2 (HCC)   . Essential hypertension   . Family history of adverse reaction to anesthesia    mother and aunt  both put to sleep for surgery and "stopped breathing"  . Morbid obesity (HCC)   . NSVT (nonsustained ventricular tachycardia) (HCC)    a. noted during 02/2015 admission.  . OSA (obstructive sleep apnea)    a. noncompliance with CPAP - wakes up with it off.    Patient Active Problem List   Diagnosis Date Noted  . Hypertensive heart disease with heart failure (HCC) 01/23/2016  . Tobacco abuse 01/23/2016  . Bradycardia   . NSVT  (nonsustained ventricular tachycardia) (HCC)   . Chronic combined systolic and diastolic CHF (congestive heart failure) (HCC)   . Benign essential HTN   . Diabetes mellitus, type 2 (HCC)   . Morbid obesity (HCC)   . Type 2 diabetes mellitus (HCC) 03/09/2015  . Cardiomyopathy (HCC) 03/08/2015  . Acute combined systolic and diastolic congestive heart failure (HCC) 03/08/2015  . Second degree AV block, Mobitz type I 03/08/2015  . OSA (obstructive sleep apnea) 03/08/2015  . Second degree AV block   . Dyspnea 03/05/2015  . Edema 03/05/2015    Past Surgical History:  Procedure Laterality Date  . NO PAST SURGERIES         Home Medications    Prior to Admission medications   Medication Sig Start Date End Date Taking? Authorizing Provider  furosemide (LASIX) 80 MG tablet TAKE 1 TABLET (80 MG TOTAL) BY MOUTH 2 (TWO) TIMES DAILY. 12/07/16  Yes Meng, Wynema Birch, PA  isosorbide-hydrALAZINE (BIDIL) 20-37.5 MG tablet Take 1 tablet by mouth 3 (three) times daily. 12/07/16  Yes Azalee Course, PA  lisinopril (PRINIVIL,ZESTRIL) 20 MG tablet Take 1 tablet (20 mg total) by mouth 2 (two) times daily. 12/07/16  Yes Azalee Course, PA  metolazone (ZAROXOLYN) 2.5 MG tablet Take 1 tab (2.5) tab by mouth Tuesday, Thursday and Saturday 07/02/17  Yes Croitoru, Rachelle Hora, MD  spironolactone (  ALDACTONE) 50 MG tablet Take 1 tablet (50 mg total) by mouth daily. 07/02/17 09/30/17 Yes Croitoru, Mihai, MD  amLODipine (NORVASC) 10 MG tablet Take 1 tablet (10 mg total) by mouth daily. 12/28/16 03/28/17  Azalee Course, PA  cetirizine (ZYRTEC) 10 MG tablet Take 1 tablet (10 mg total) by mouth daily. 08/07/17   Berton Bon, MD  Chlorpheniramine-DM 4-30 MG TABS Take 2 tablets a need every 6 hours a needed for symptomatic relief 08/07/17   Aikam Vinje, Antionette Poles, MD  fluticasone (FLONASE) 50 MCG/ACT nasal spray Place 1 spray into both nostrils daily. 1 spray in each nostril every day 08/07/17   Berton Bon, MD  sulfacetamide (BLEPH-10)  10 % ophthalmic solution Place 1-2 drops into the left eye every 2 (two) hours while awake. 07/12/16   Deatra Canter, FNP    Family History Family History  Problem Relation Age of Onset  . Hypertension Mother   . Hyperlipidemia Mother   . Heart failure Mother   . Multiple sclerosis Mother   . Breast cancer Maternal Grandmother   . Diabetes Maternal Grandmother   . Colon cancer Maternal Uncle     Social History Social History  Substance Use Topics  . Smoking status: Current Every Day Smoker    Packs/day: 0.50    Years: 19.00    Types: Cigarettes    Start date: 01/05/1996    Last attempt to quit: 03/04/2015  . Smokeless tobacco: Never Used     Comment: Already decreasaed smoking  . Alcohol use No     Comment: Occasional     Allergies   Penicillins   Review of Systems Review of Systems  Constitutional: Negative for chills and fever.  HENT: Positive for congestion. Negative for ear discharge and ear pain.   Eyes: Negative for discharge and itching.  Respiratory: Positive for cough. Negative for shortness of breath and wheezing.   Cardiovascular: Negative for leg swelling.  Gastrointestinal: Negative for nausea and vomiting.     Physical Exam Triage Vital Signs ED Triage Vitals [08/07/17 1024]  Enc Vitals Group     BP 139/90     Pulse Rate 96     Resp      Temp 98.7 F (37.1 C)     Temp Source Oral     SpO2 96 %     Weight      Height      Head Circumference      Peak Flow      Pain Score      Pain Loc      Pain Edu?      Excl. in GC?    No data found.   Updated Vital Signs BP 139/90 (BP Location: Right Arm)   Pulse 96   Temp 98.7 F (37.1 C) (Oral)   SpO2 96%   Physical Exam  Constitutional: He appears well-developed and well-nourished.  HENT:  Head: Normocephalic and atraumatic.  Right Ear: External ear normal.  Left Ear: External ear normal.  Mouth/Throat: Oropharynx is clear and moist.  Nasal congestion noted  Eyes: Pupils are equal,  round, and reactive to light. EOM are normal.  Neck: Normal range of motion. Neck supple.  Cardiovascular: Normal rate.   Pulmonary/Chest: Effort normal and breath sounds normal.  Abdominal: Soft. Bowel sounds are normal.  Musculoskeletal: Normal range of motion.  Skin: Skin is warm. Capillary refill takes less than 2 seconds.     UC Treatments / Results  Labs (all labs  ordered are listed, but only abnormal results are displayed) Labs Reviewed - No data to display  EKG  EKG Interpretation None       Radiology No results found.  Procedures Procedures (including critical care time)  Medications Ordered in UC Medications - No data to display   Initial Impression / Assessment and Plan / UC Course  I have reviewed the triage vital signs and the nursing notes.  Pertinent labs & imaging results that were available during my care of the patient were reviewed by me and considered in my medical decision making (see chart for details).   Possible viral URI versus seasonal allergies given 1 day of symptoms hard to determine. The patient with nasal congestion, no crackles in lower lung fields no concerns about heart failure. Will provide symptomatic treatment. Follow up with primary care physician as needed if no improvement in 3-4 days.  Final Clinical Impressions(s) / UC Diagnoses   Final diagnoses:  Upper respiratory tract infection, unspecified type    New Prescriptions New Prescriptions   CETIRIZINE (ZYRTEC) 10 MG TABLET    Take 1 tablet (10 mg total) by mouth daily.   CHLORPHENIRAMINE-DM 4-30 MG TABS    Take 2 tablets a need every 6 hours a needed for symptomatic relief   FLUTICASONE (FLONASE) 50 MCG/ACT NASAL SPRAY    Place 1 spray into both nostrils daily. 1 spray in each nostril every day     Berton Bon, MD 08/07/17 1104

## 2017-08-13 ENCOUNTER — Ambulatory Visit (INDEPENDENT_AMBULATORY_CARE_PROVIDER_SITE_OTHER): Payer: Managed Care, Other (non HMO) | Admitting: Cardiology

## 2017-08-13 ENCOUNTER — Encounter: Payer: Self-pay | Admitting: Cardiology

## 2017-08-13 VITALS — BP 138/80 | HR 103 | Ht 73.0 in | Wt 374.0 lb

## 2017-08-13 DIAGNOSIS — I10 Essential (primary) hypertension: Secondary | ICD-10-CM

## 2017-08-13 DIAGNOSIS — G4733 Obstructive sleep apnea (adult) (pediatric): Secondary | ICD-10-CM

## 2017-08-13 NOTE — Progress Notes (Signed)
Cardiology Office Note:    Date:  08/13/2017   ID:  Roger Ward, DOB 04/10/1981, MRN [id number]  PCP:  Calone, Gregory D, FNP  Cardiologist:  Antawn Sison, MD   Referring MD: Calone, Gregory D, FNP   No chief complaint on file.   History of Present Illness:    Roger Ward is a 36 y.o. male with a hx of with a history of HTN, obesity and  OSA on BiPAP device.  He was initially  diagnosed with severe OSA with an AHI of 100/hr and was started on BiPAP at 23/19cm H2O.   He has been using the device nightly. He uses a full face mask which he tolerates well and feels the pressure is adequate.  The one problem he is having with his mask is that there is a hole in it and causing a very bid mask leak.  Since going on BiPAP, he feels more rested in the am but does have some daytime sleepiness.  He no longer snores.  He sleeps well at night.  He denies any mouth dryness.  Past Medical History:  Diagnosis Date  . 2nd degree AV block    a. noted during 02/2015 admission - second degree AV AV block (not further defined in notes). Not on BB due to this.  . Asthma    Childhood  . Bradycardia    a. noted during 02/2015 admission - second degree AV AV block (not further defined in notes). Not on BB due to this.  . Chronic combined systolic and diastolic CHF (congestive heart failure) (HCC)    a. echo 03/06/15 showing mild LVH, EF 40%, high ventricular filling pressures, mod MR, mildly dilated RV/mildly reduced RV function, mildly dilated RA, PASP <MEASURJingJuliLoMclaren Central MichigPegKari<MEASURJinger [facility] Health St. Mary'SJuliLoThe Eye Surge<MEASUREMJinger [place] MentJuliLoDigestive Health Endo<MEASUREMENTJinger JuliLoCape<MEASUREMENTJinger MiamJuliLoValley Baptist [facility] Center - Har<MEASUREJinger Physicians Surgical HJuliLoRush County Memorial HospitTipKarilCh A<MEASUREMEJinger SurgeryJuliLoHacke<MEASUREMJinger LiJuliLoPain Diagnosti<MEASUREJinger Plastic AJuliLoCarrillo Surgery C GJinger JuliLoPearland P iJiJuliLoDwight D. Eisenhower Va Medical CentEKarilBerstein Hilliker Hartzel<MEASURJinger Eyes OJuliLoAscension Providence Health CentPro<MEASUREJinger [place] SpeJuliLoElmhurst Hospital CentWest HattiesbKarilPunx<MEASUREMENTZephJinger Co<MEASUREMENTJinger [facility] PhysiciaJuliLoCaldwel<MEASUREJinger [facility] AmbJuli<MEASUREMJuliLoHorizon Specialty Hospital <MEASUREMJinger Whittier RehabiJuliLoSt. John Broken ArrBrookdKarilCorpus Christi E<MEJuliLoThe Endoscopy Center [facility] Cen<MEASUJinger SchleiJuliLoMemorial Regional Hospit<MEASUJJuliLoSentara [place] G<MJ<MEASUREMENTJinJuliLoChi Healt t(NanJinger JuliLoCentennial Medical PlaCh<MEASURJinger [place] MentJuliLoMetropol nAJinger [facility] [place] [facility] HeJuliLoCreekwood Surg<MEASUREMJinger ProgrJuliLoBergan Mercy<MEASUREMEJinger Good Samaritan RegionJul<MEASUREMEJinger JuliLoCentral [place] Surgical Cen<MEASUJinger [facility] LP Dba Frontenac SurJuliLoTroy Regional Medical Cen<MEASUREMENJinger PrJuliLoLewis County General HospitCar<MEASUREJuliLoBaylor Scott & White [facility] - College Stat7Jinger St Vincent ClLoGulf Co<MEASUREJingerJuliLoPi tJinger Centura Health-Penrose JuliLoBeacham Me<MEASURJinger FayettJuliLoMarshfield Medical Center LadysmiGregKarilEncom<MEASUJinger MiJuliLoSpringbrook Behavioral Health SystWoodlKarilSt Josephs Surgery CenteUniversity Of Alabama HospitalrVerlan action to anesthesia    mother and aunt  both put to sleep for surgery and "stopped breathing"  . Morbid obesity (HCC)   . NSVT (nonsustained ventricular tachycardia) (HCC)    a. noted during 02/2015 admission.  . OSA (obstructive sleep apnea)    a. noncompliance with CPAP - wakes up with it off.    Past Surgical History:  Procedure Laterality Date    . NO PAST SURGERIES      Current Medications: Current Meds  Medication Sig  . amLODipine (NORVASC) 10 MG tablet Take 1 tablet (10 mg total) by mouth daily.  . cetirizine (ZYRTEC) 10 MG tablet Take 1 tablet (10 mg total) by mouth daily.  . Chlorpheniramine-DM 4-30 MG TABS Take 2 tablets a need every 6 hours a needed for symptomatic relief  . fluticasone (FLONASE) 50 MCG/ACT nasal spray Place 1 spray into both nostrils daily. 1 spray in each nostril every day  . furosemide (LASIX) 80 MG tablet TAKE 1 TABLET (80 MG TOTAL) BY MOUTH 2 (TWO) TIMES DAILY.  . isosorbide-hydrALAZINE (BIDIL) 20-37.5 MG tablet Take 1 tablet by mouth 3 (three) times daily.  . lisinopril (PRINIVIL,ZESTRIL) 20 MG tablet Take 1 tablet (20 mg total) by mouth 2 (two) times daily.  . metolazone (ZAROXOLYN) 2.5 MG tablet Take 1 tab (2.5) tab by mouth Tuesday, Thursday and Saturday  . spironolactone (ALDACTONE) 50 MG tablet Take 1 tablet (50 mg total) by mouth daily.  . sulfacetamide (BLEPH-10) 10 % ophthalmic solution Place 1-2 drops into the left eye every 2 (two) hours while awake.     Allergies:   Penicillins  Social History   Social History  . Marital status: Married    Spouse name: N/A  . Number of children: 0  . Years of education: 12   Occupational History  . Supervisor at [employer]    Social History Main Topics  . Smoking status: Current Every Day Smoker    Packs/day: 0.50    Years: 19.00    Types: Cigarettes    Start date: 01/05/1996    Last attempt to quit: 03/04/2015  . Smokeless tobacco: Never Used     Comment: Already decreasaed smoking  . Alcohol use No     Comment: Occasional  . Drug use: No  . Sexual activity: Yes    Birth control/ protection: Condom   Other Topics Concern  . None   Social History Narrative   Born and raised in [place]. Fun: Bowling, movies, we go   Denies religious beliefs that would effect health care.      Family History: The patient's family  history includes Breast cancer in his maternal grandmother; Colon cancer in his maternal uncle; Diabetes in his maternal grandmother; Heart failure in his mother; Hyperlipidemia in his mother; Hypertension in his mother; Multiple sclerosis in his mother.  ROS:   Please see the history of present illness.     All other systems reviewed and are negative.  EKGs/Labs/Other Studies Reviewed:    The following studies were reviewed today: BIPAP download  EKG:  EKG is not ordered today.    Recent Labs: 12/28/2016: BUN 9; Creat 0.92; Potassium 4.3; Sodium 139   Recent Lipid Panel    Component Value Date/Time   CHOL 166 03/06/2015 0155   TRIG 248 (H) 03/06/2015 0155   HDL 42 03/06/2015 0155   CHOLHDL 4.0 03/06/2015 0155   VLDL 50 (H) 03/06/2015 0155   LDLCALC 74 03/06/2015 0155    Physical Exam:    VS:  BP 138/80   Pulse (!) 103   Ht 6\' 1"  (1.854 m)   Wt (!) 374 lb (169.6 kg)   SpO2 (!) 89%   BMI 49.34 kg/m     Wt Readings from Last 3 Encounters:  08/13/17 (!) 374 lb (169.6 kg)  04/30/17 (!) 358 lb (162.4 kg)  12/28/16 (!) 357 lb (161.9 kg)     GEN:  Well nourished, well developed in no acute distress HEENT: Normal NECK: No JVD; No carotid bruits LYMPHATICS: No lymphadenopathy CARDIAC: RRR, no murmurs, rubs, gallops RESPIRATORY:  Scattered wheezes ABDOMEN: Soft, non-tender, non-distended MUSCULOSKELETAL:  No edema; No deformity  SKIN: Warm and dry NEUROLOGIC:  Alert and oriented x 3 PSYCHIATRIC:  Normal affect   ASSESSMENT:    1. OSA (obstructive sleep apnea)   2. Benign essential HTN   3. Morbid obesity (HCC)    PLAN:    In order of problems listed above:  OSA - the patient is tolerating PAP therapy well without any problems. The PAP download was reviewed today and showed an AHI of 34.6/hr on 23/19 cm H2O with 23% compliance in using more than 4 hours nightly.  The patient has been using and benefiting from CPAP use and will continue to benefit from therapy but  his AHI is very high likely due to significant mask leak.  His current mask has a hole in it so I will order a new mask from [facility]. I will get a download in 6 weeks  2.   HTN - BP well controlled on current meds.  He will continue on amlodipnie  10mg  daily, Bidil 20-37.5mg  TID, aldactone 50mg  daily and Lisinopril 20mg  daily  3.  Morbid obesity - I have encouraged him to get into a routine exercise program and cut back on carbs and portions.    Medication Adjustments/Labs and Tests Ordered: Current medicines are reviewed at length with the patient today.  Concerns regarding medicines are outlined above.  No orders of the defined types were placed in this encounter.  No orders of the defined types were placed in this encounter.   Signed, Armanda Magic, MD  08/13/2017 8:01 AM    [facility]

## 2017-08-13 NOTE — Patient Instructions (Signed)
Medication Instructions:  Your provider recommends that you continue on your current medications as directed. Please refer to the Current Medication list given to you today.   Labwork: None  Testing/Procedures: None  Follow-Up: Your provider wants you to follow-up in: 1 year with Dr. Mayford Knife. You will receive a reminder letter in the mail two months in advance. If you don't receive a letter, please call our office to schedule the follow-up appointment.    Any Other Special Instructions Will Be Listed Below (If Applicable). Dr. Mayford Knife has ordered for you a new mask for your PAP. We will get a download 6 weeks after you get it and let you know how you're doing!    If you need a refill on your cardiac medications before your next appointment, please call your pharmacy.

## 2017-10-21 ENCOUNTER — Inpatient Hospital Stay (HOSPITAL_COMMUNITY)
Admission: EM | Admit: 2017-10-21 | Discharge: 2017-10-30 | DRG: 291 | Disposition: A | Discharge status: Home / Self Care | Payer: Managed Care, Other (non HMO) | Attending: Internal Medicine | Admitting: Internal Medicine

## 2017-10-21 ENCOUNTER — Emergency Department (HOSPITAL_COMMUNITY): Payer: Managed Care, Other (non HMO)

## 2017-10-21 ENCOUNTER — Encounter (HOSPITAL_COMMUNITY): Payer: Self-pay

## 2017-10-21 DIAGNOSIS — E875 Hyperkalemia: Secondary | ICD-10-CM | POA: Diagnosis not present

## 2017-10-21 DIAGNOSIS — Z833 Family history of diabetes mellitus: Secondary | ICD-10-CM

## 2017-10-21 DIAGNOSIS — I5023 Acute on chronic systolic (congestive) heart failure: Secondary | ICD-10-CM

## 2017-10-21 DIAGNOSIS — I248 Other forms of acute ischemic heart disease: Secondary | ICD-10-CM | POA: Diagnosis present

## 2017-10-21 DIAGNOSIS — F17210 Nicotine dependence, cigarettes, uncomplicated: Secondary | ICD-10-CM | POA: Diagnosis present

## 2017-10-21 DIAGNOSIS — Z6841 Body Mass Index (BMI) 40.0 and over, adult: Secondary | ICD-10-CM

## 2017-10-21 DIAGNOSIS — G4733 Obstructive sleep apnea (adult) (pediatric): Secondary | ICD-10-CM | POA: Diagnosis present

## 2017-10-21 DIAGNOSIS — I130 Hypertensive heart and chronic kidney disease with heart failure and stage 1 through stage 4 chronic kidney disease, or unspecified chronic kidney disease: Secondary | ICD-10-CM | POA: Diagnosis not present

## 2017-10-21 DIAGNOSIS — Z79899 Other long term (current) drug therapy: Secondary | ICD-10-CM

## 2017-10-21 DIAGNOSIS — I509 Heart failure, unspecified: Secondary | ICD-10-CM

## 2017-10-21 DIAGNOSIS — I5082 Biventricular heart failure: Secondary | ICD-10-CM | POA: Diagnosis present

## 2017-10-21 DIAGNOSIS — R0902 Hypoxemia: Secondary | ICD-10-CM

## 2017-10-21 DIAGNOSIS — I5041 Acute combined systolic (congestive) and diastolic (congestive) heart failure: Secondary | ICD-10-CM | POA: Diagnosis present

## 2017-10-21 DIAGNOSIS — I472 Ventricular tachycardia: Secondary | ICD-10-CM | POA: Diagnosis present

## 2017-10-21 DIAGNOSIS — Z9114 Patient's other noncompliance with medication regimen: Secondary | ICD-10-CM

## 2017-10-21 DIAGNOSIS — Z880 Allergy status to penicillin: Secondary | ICD-10-CM

## 2017-10-21 DIAGNOSIS — J9601 Acute respiratory failure with hypoxia: Secondary | ICD-10-CM | POA: Diagnosis present

## 2017-10-21 DIAGNOSIS — N189 Chronic kidney disease, unspecified: Secondary | ICD-10-CM | POA: Diagnosis present

## 2017-10-21 DIAGNOSIS — E119 Type 2 diabetes mellitus without complications: Secondary | ICD-10-CM

## 2017-10-21 DIAGNOSIS — E1122 Type 2 diabetes mellitus with diabetic chronic kidney disease: Secondary | ICD-10-CM | POA: Diagnosis present

## 2017-10-21 DIAGNOSIS — N179 Acute kidney failure, unspecified: Secondary | ICD-10-CM | POA: Diagnosis present

## 2017-10-21 DIAGNOSIS — Z8249 Family history of ischemic heart disease and other diseases of the circulatory system: Secondary | ICD-10-CM

## 2017-10-21 LAB — BASIC METABOLIC PANEL
ANION GAP: 9 (ref 5–15)
BUN: 17 mg/dL (ref 6–20)
CHLORIDE: 100 mmol/L — AB (ref 101–111)
CO2: 30 mmol/L (ref 22–32)
Calcium: 8.4 mg/dL — ABNORMAL LOW (ref 8.9–10.3)
Creatinine, Ser: 1.11 mg/dL (ref 0.61–1.24)
Glucose, Bld: 122 mg/dL — ABNORMAL HIGH (ref 65–99)
POTASSIUM: 4.1 mmol/L (ref 3.5–5.1)
SODIUM: 139 mmol/L (ref 135–145)

## 2017-10-21 LAB — CBC
HEMATOCRIT: 51 % (ref 39.0–52.0)
HEMOGLOBIN: 16.1 g/dL (ref 13.0–17.0)
MCH: 26 pg (ref 26.0–34.0)
MCHC: 31.6 g/dL (ref 30.0–36.0)
MCV: 82.4 fL (ref 78.0–100.0)
Platelets: 251 10*3/uL (ref 150–400)
RBC: 6.19 MIL/uL — AB (ref 4.22–5.81)
RDW: 18.4 % — ABNORMAL HIGH (ref 11.5–15.5)
WBC: 8.4 10*3/uL (ref 4.0–10.5)

## 2017-10-21 LAB — I-STAT TROPONIN, ED: Troponin i, poc: 0.08 ng/mL (ref 0.00–0.08)

## 2017-10-21 NOTE — ED Triage Notes (Signed)
Skin on abdomen and chest noted to be very tight. Pt improved to 98% on 2L via nasal cannula.

## 2017-10-21 NOTE — ED Triage Notes (Signed)
Pt states hx of CHF. He states he has been retaining fluid for 2 weeks. Pt states 402 lb is an increase for him. Pt markedly SOB after ambulating a short distance.

## 2017-10-22 ENCOUNTER — Inpatient Hospital Stay (HOSPITAL_COMMUNITY): Payer: Managed Care, Other (non HMO)

## 2017-10-22 ENCOUNTER — Other Ambulatory Visit: Payer: Self-pay

## 2017-10-22 ENCOUNTER — Encounter (HOSPITAL_COMMUNITY): Payer: Self-pay | Admitting: Internal Medicine

## 2017-10-22 DIAGNOSIS — Z880 Allergy status to penicillin: Secondary | ICD-10-CM | POA: Diagnosis not present

## 2017-10-22 DIAGNOSIS — I5023 Acute on chronic systolic (congestive) heart failure: Secondary | ICD-10-CM | POA: Diagnosis not present

## 2017-10-22 DIAGNOSIS — N179 Acute kidney failure, unspecified: Secondary | ICD-10-CM | POA: Diagnosis present

## 2017-10-22 DIAGNOSIS — Z79899 Other long term (current) drug therapy: Secondary | ICD-10-CM | POA: Diagnosis not present

## 2017-10-22 DIAGNOSIS — I5041 Acute combined systolic (congestive) and diastolic (congestive) heart failure: Secondary | ICD-10-CM | POA: Diagnosis not present

## 2017-10-22 DIAGNOSIS — I248 Other forms of acute ischemic heart disease: Secondary | ICD-10-CM | POA: Diagnosis not present

## 2017-10-22 DIAGNOSIS — R0902 Hypoxemia: Secondary | ICD-10-CM | POA: Diagnosis not present

## 2017-10-22 DIAGNOSIS — I5082 Biventricular heart failure: Secondary | ICD-10-CM | POA: Diagnosis present

## 2017-10-22 DIAGNOSIS — Z8249 Family history of ischemic heart disease and other diseases of the circulatory system: Secondary | ICD-10-CM | POA: Diagnosis not present

## 2017-10-22 DIAGNOSIS — Z833 Family history of diabetes mellitus: Secondary | ICD-10-CM | POA: Diagnosis not present

## 2017-10-22 DIAGNOSIS — Z9114 Patient's other noncompliance with medication regimen: Secondary | ICD-10-CM | POA: Diagnosis not present

## 2017-10-22 DIAGNOSIS — E1169 Type 2 diabetes mellitus with other specified complication: Secondary | ICD-10-CM | POA: Diagnosis not present

## 2017-10-22 DIAGNOSIS — E1122 Type 2 diabetes mellitus with diabetic chronic kidney disease: Secondary | ICD-10-CM | POA: Diagnosis present

## 2017-10-22 DIAGNOSIS — E875 Hyperkalemia: Secondary | ICD-10-CM | POA: Diagnosis not present

## 2017-10-22 DIAGNOSIS — G4733 Obstructive sleep apnea (adult) (pediatric): Secondary | ICD-10-CM | POA: Diagnosis present

## 2017-10-22 DIAGNOSIS — J9601 Acute respiratory failure with hypoxia: Secondary | ICD-10-CM

## 2017-10-22 DIAGNOSIS — Z6841 Body Mass Index (BMI) 40.0 and over, adult: Secondary | ICD-10-CM | POA: Diagnosis not present

## 2017-10-22 DIAGNOSIS — F17210 Nicotine dependence, cigarettes, uncomplicated: Secondary | ICD-10-CM | POA: Diagnosis present

## 2017-10-22 DIAGNOSIS — I472 Ventricular tachycardia: Secondary | ICD-10-CM | POA: Diagnosis present

## 2017-10-22 DIAGNOSIS — I130 Hypertensive heart and chronic kidney disease with heart failure and stage 1 through stage 4 chronic kidney disease, or unspecified chronic kidney disease: Secondary | ICD-10-CM | POA: Diagnosis present

## 2017-10-22 DIAGNOSIS — I509 Heart failure, unspecified: Secondary | ICD-10-CM

## 2017-10-22 DIAGNOSIS — N189 Chronic kidney disease, unspecified: Secondary | ICD-10-CM | POA: Diagnosis present

## 2017-10-22 LAB — I-STAT TROPONIN, ED: TROPONIN I, POC: 0.09 ng/mL — AB (ref 0.00–0.08)

## 2017-10-22 LAB — CREATININE, SERUM
Creatinine, Ser: 1.2 mg/dL (ref 0.61–1.24)
GFR calc Af Amer: >60 mL/min (ref >60)
GFR calc non Af Amer: >60 mL/min (ref >60)

## 2017-10-22 LAB — HIV ANTIBODY (ROUTINE TESTING W REFLEX): HIV SCREEN 4TH GENERATION: NONREACTIVE

## 2017-10-22 LAB — TSH: TSH: 0.789 u[IU]/mL (ref 0.350–4.500)

## 2017-10-22 LAB — ECHOCARDIOGRAM COMPLETE
Height: 73 in
WEIGHTICAEL: 6436.8 [oz_av]

## 2017-10-22 LAB — CBC
HCT: 52.2 % — ABNORMAL HIGH (ref 39.0–52.0)
Hemoglobin: 16.1 g/dL (ref 13.0–17.0)
MCH: 25.6 pg — ABNORMAL LOW (ref 26.0–34.0)
MCHC: 30.8 g/dL (ref 30.0–36.0)
MCV: 82.9 fL (ref 78.0–100.0)
Platelets: 262 10*3/uL (ref 150–400)
RBC: 6.3 MIL/uL — ABNORMAL HIGH (ref 4.22–5.81)
RDW: 18 % — ABNORMAL HIGH (ref 11.5–15.5)
WBC: 7.3 10*3/uL (ref 4.0–10.5)

## 2017-10-22 LAB — MAGNESIUM: MAGNESIUM: 1.9 mg/dL (ref 1.7–2.4)

## 2017-10-22 LAB — TROPONIN I
Troponin I: 0.06 ng/mL (ref <0.03)
Troponin I: 0.05 ng/mL (ref <0.03)
Troponin I: 0.05 ng/mL (ref <0.03)

## 2017-10-22 LAB — BRAIN NATRIURETIC PEPTIDE: B Natriuretic Peptide: 231.7 pg/mL — ABNORMAL HIGH (ref 0.0–100.0)

## 2017-10-22 MED ORDER — LORATADINE 10 MG PO TABS
10.0000 mg | ORAL_TABLET | Freq: Every day | ORAL | Status: DC
  Administered 2017-10-22 – 2017-10-30 (×9): 10 mg via ORAL
  Filled 2017-10-22 (×9): qty 1

## 2017-10-22 MED ORDER — ISOSORB DINITRATE-HYDRALAZINE 20-37.5 MG PO TABS
1.0000 | ORAL_TABLET | Freq: Three times a day (TID) | ORAL | Status: DC
  Administered 2017-10-22 – 2017-10-30 (×25): 1 via ORAL
  Filled 2017-10-22 (×27): qty 1

## 2017-10-22 MED ORDER — AMLODIPINE BESYLATE 10 MG PO TABS
10.0000 mg | ORAL_TABLET | Freq: Every day | ORAL | Status: DC
  Administered 2017-10-22: 10 mg via ORAL
  Filled 2017-10-22: qty 2

## 2017-10-22 MED ORDER — PERFLUTREN LIPID MICROSPHERE
1.0000 mL | INTRAVENOUS | Status: AC | PRN
  Administered 2017-10-22: 3 mL via INTRAVENOUS
  Filled 2017-10-22: qty 10

## 2017-10-22 MED ORDER — SPIRONOLACTONE 25 MG PO TABS
50.0000 mg | ORAL_TABLET | Freq: Every day | ORAL | Status: DC
  Administered 2017-10-22 – 2017-10-30 (×9): 50 mg via ORAL
  Filled 2017-10-22: qty 1
  Filled 2017-10-22 (×5): qty 2
  Filled 2017-10-22: qty 1
  Filled 2017-10-22 (×3): qty 2

## 2017-10-22 MED ORDER — ASPIRIN 81 MG PO CHEW
324.0000 mg | CHEWABLE_TABLET | Freq: Once | ORAL | Status: AC
  Administered 2017-10-22: 324 mg via ORAL
  Filled 2017-10-22: qty 4

## 2017-10-22 MED ORDER — ENOXAPARIN SODIUM 80 MG/0.8ML ~~LOC~~ SOLN
80.0000 mg | SUBCUTANEOUS | Status: DC
  Administered 2017-10-23: 10:00:00 80 mg via SUBCUTANEOUS
  Filled 2017-10-22: qty 0.8

## 2017-10-22 MED ORDER — ENOXAPARIN SODIUM 40 MG/0.4ML ~~LOC~~ SOLN
40.0000 mg | SUBCUTANEOUS | Status: DC
  Administered 2017-10-22: 40 mg via SUBCUTANEOUS
  Filled 2017-10-22 (×2): qty 0.4

## 2017-10-22 MED ORDER — ACETAMINOPHEN 650 MG RE SUPP: 650.0000 mg | Freq: Four times a day (QID) | RECTAL | Status: DC | PRN

## 2017-10-22 MED ORDER — METOLAZONE 2.5 MG PO TABS
2.5000 mg | ORAL_TABLET | ORAL | Status: DC
  Administered 2017-10-23: 2.5 mg via ORAL
  Filled 2017-10-22: qty 1

## 2017-10-22 MED ORDER — LISINOPRIL 20 MG PO TABS
20.0000 mg | ORAL_TABLET | Freq: Two times a day (BID) | ORAL | Status: DC
  Administered 2017-10-22 – 2017-10-28 (×13): 20 mg via ORAL
  Filled 2017-10-22 (×13): qty 1

## 2017-10-22 MED ORDER — ACETAMINOPHEN 325 MG PO TABS: 650.0000 mg | ORAL_TABLET | Freq: Four times a day (QID) | ORAL | Status: DC | PRN

## 2017-10-22 MED ORDER — FUROSEMIDE 10 MG/ML IJ SOLN
80.0000 mg | Freq: Once | INTRAMUSCULAR | Status: AC
  Administered 2017-10-22: 80 mg via INTRAVENOUS
  Filled 2017-10-22: qty 8

## 2017-10-22 MED ORDER — FUROSEMIDE 10 MG/ML IJ SOLN
80.0000 mg | Freq: Two times a day (BID) | INTRAMUSCULAR | Status: DC
  Administered 2017-10-22 – 2017-10-26 (×9): 80 mg via INTRAVENOUS
  Filled 2017-10-22 (×9): qty 8

## 2017-10-22 MED ORDER — FLUTICASONE PROPIONATE 50 MCG/ACT NA SUSP
1.0000 | Freq: Every day | NASAL | Status: DC
  Administered 2017-10-25: 1 via NASAL
  Filled 2017-10-22: qty 16

## 2017-10-22 NOTE — Progress Notes (Signed)
  PROGRESS NOTE  Roger Ward SMO:[id number] DOB: 01/12/1981 DOA: 10/21/2017 PCP: Veryl Speak, FNP  Brief Narrative: 36yom PMH systolic/diastolic CHF presented with SOB, weight gain, edema, noncompliance with diuretics. Admitted for acute CHF  Assessment/Plan Acute on chronic systolic/diastolic CHF secondary to noncompliance with diuretics: Lasix, metolazone, spironolactone. - excellent UOP. Has significant volume overload.  - continue Bidil, lisinopril, metolazone, spironolactone - continue IV Lasix. Daily BMP.  Demand ischemia - secondary to acute CHF. No CP. Troponins flat. EKG non-acute. No further evaluation suggested.  Acute hypoxic respiratory failure secondary to acute CHF. - treat CHF. Wean oxygen as tolerated  DM type 2, diet-controlled - stable. SSI.  Essential HTN - stable. Continue meds as above  Morbid obesity - dietician consult  OSA - BiPAP QHS   DVT prophylaxis: enoxaparin Code Status: full Family Communication: none Disposition Plan: home    Brendia Sacks, MD  [facility] Direct contact: [phone] --Via amion app OR  --[email or web address]; password [email or web address]  7PM-7AM contact night coverage as above 10/22/2017, 3:03 PM  LOS: 0 days   Consultants:    Procedures:    Antimicrobials:    Interval history/Subjective: Feels better, breathing better. No pain.  Objective: Vitals:  Vitals:   10/22/17 1330 10/22/17 1503  BP: (!) 115/56 (P) 138/88  Pulse: 98 (P) 98  Resp: (!) 21 (!) (P) 24  Temp:  (P) 98.6 F (37 C)  SpO2: 98% (P) 100%    Exam:  Constitutional:  . Appears calm and comfortable sitting on side of bed ENMT:  . grossly normal hearing  Respiratory:  . CTA bilaterally, no w/r/r. Decreased breath sounds. Marland Kitchen Respiratory effort normal. Cardiovascular:  . RRR, no m/r/g . 3+ BLE extremity edema   Psychiatric:  . Mental status o Mood, affect appropriate   I have personally reviewed the following:  Filed Weights     10/21/17 1800  Weight: (!) 182.5 kg (402 lb 4.8 oz)   Weight change:   UOP: 2600 I/O since admission: -3060 Last BM charted:  Foley: no Status: tele  Labs:  BMP, CBC, TSH unremarkable  Troponins flat .08 >> 0.09 >> .06 >> 0.05   Imaging studies:  CXR vascular congestion  Medical tests:  EKG ST, RBBB (old)  Test discussed with performing physician:    Decision to obtain old records:    Review and summation of old records:    Scheduled Meds: . amLODipine  10 mg Oral Daily  . enoxaparin (LOVENOX) injection  40 mg Subcutaneous Q24H  . fluticasone  1 spray Each Nare Daily  . furosemide  80 mg Intravenous Q12H  . isosorbide-hydrALAZINE  1 tablet Oral TID  . lisinopril  20 mg Oral BID  . loratadine  10 mg Oral Daily  . [START ON 10/23/2017] metolazone  2.5 mg Oral Once per day on Mon Wed Fri  . spironolactone  50 mg Oral Daily   Continuous Infusions:  Active Problems:   Acute combined systolic and diastolic congestive heart failure (HCC)   OSA (obstructive sleep apnea)   Morbid obesity (HCC)   CHF (congestive heart failure) (HCC)   LOS: 0 days

## 2017-10-22 NOTE — Progress Notes (Signed)
  Echocardiogram 2D Echocardiogram with definity has been performed.  Leta Jungling M 10/22/2017, 12:17 PM

## 2017-10-22 NOTE — ED Notes (Signed)
Pt taken to ECHO

## 2017-10-22 NOTE — ED Notes (Signed)
[facility] 

## 2017-10-22 NOTE — ED Notes (Signed)
Phlebotomy at the bedside  

## 2017-10-22 NOTE — H&P (Signed)
History and Physical    Roger Ward NIO:[id number] DOB: 22-May-1981 DOA: 10/21/2017  PCP: Veryl Speak, FNP  Patient coming from: Home.  Chief Complaint: Shortness of breath.  HPI: Roger Ward is a 36 y.o. male with history of chronic systolic and diastolic heart failure, morbid obesity, sleep apnea presents to the ER because of worsening shortness of breath.  Patient states over the last few days patient has been getting progressively short of breath.  Patient states he has not taken some of his medications including diuretics due to problem with finances.  He has gained weight.  Denies any chest pain productive cough fever chills.  ED Course: In the ER patient is found to be hypoxic with chest x-ray showing congestion.  BNP was elevated.  Troponin was mildly elevated.  EKG shows sinus rhythm with RBBB.  Patient has gained at least 25 pounds from recent past.  Patient was given Lasix 80 mg IV admitted for CHF management.  Review of Systems: As per HPI, rest all negative.   Past Medical History:  Diagnosis Date  . 2nd degree AV block    a. noted during 02/2015 admission - second degree AV AV block (not further defined in notes). Not on BB due to this.  . Asthma    Childhood  . Bradycardia    a. noted during 02/2015 admission - second degree AV AV block (not further defined in notes). Not on BB due to this.  . Chronic combined systolic and diastolic CHF (congestive heart failure) (HCC)    a. echo 03/06/15 showing mild LVH, EF 40%, high ventricular filling pressures, mod MR, mildly dilated RV/mildly reduced RV function, mildly dilated RA, PASP .  . Diabetes mellitus, type 2 (HCC)   . Essential hypertension   . Family history of adverse reaction to anesthesia    mother and aunt  both put to sleep for surgery and "stopped breathing"  . Morbid obesity (HCC)   . NSVT (nonsustained ventricular tachycardia) (HCC)    a. noted during 02/2015 admission.  . OSA (obstructive sleep  apnea)    a. noncompliance with CPAP - wakes up with it off.    Past Surgical History:  Procedure Laterality Date  . NO PAST SURGERIES       reports that he has been smoking cigarettes.  He started smoking about 21 years ago. He has a 9.50 pack-year smoking history. he has never used smokeless tobacco. He reports that he does not drink alcohol or use drugs.  Allergies  Allergen Reactions  . Penicillins Swelling    Family History  Problem Relation Age of Onset  . Hypertension Mother   . Hyperlipidemia Mother   . Heart failure Mother   . Multiple sclerosis Mother   . Breast cancer Maternal Grandmother   . Diabetes Maternal Grandmother   . Colon cancer Maternal Uncle     Prior to Admission medications   Medication Sig Start Date End Date Taking? Authorizing Provider  amLODipine (NORVASC) 10 MG tablet Take 1 tablet (10 mg total) by mouth daily. 12/28/16 10/23/23 Yes Azalee Course, PA  cetirizine (ZYRTEC) 10 MG tablet Take 1 tablet (10 mg total) by mouth daily. 08/07/17  Yes Mikell, Antionette Poles, MD  fluticasone (FLONASE) 50 MCG/ACT nasal spray Place 1 spray into both nostrils daily. 1 spray in each nostril every day 08/07/17  Yes Mikell, Antionette Poles, MD  furosemide (LASIX) 80 MG tablet TAKE 1 TABLET (80 MG TOTAL) BY MOUTH 2 (TWO) TIMES  DAILY. 12/07/16  Yes Meng, Wynema BirchHao, PA  isosorbide-hydrALAZINE (BIDIL) 20-37.5 MG tablet Take 1 tablet by mouth 3 (three) times daily. 12/07/16  Yes Azalee CourseMeng, Hao, PA  lisinopril (PRINIVIL,ZESTRIL) 20 MG tablet Take 1 tablet (20 mg total) by mouth 2 (two) times daily. 12/07/16  Yes Azalee CourseMeng, Hao, PA  metolazone (ZAROXOLYN) 2.5 MG tablet Take 1 tab (2.5) tab by mouth Tuesday, Thursday and Saturday 07/02/17  Yes Croitoru, Mihai, MD  spironolactone (ALDACTONE) 50 MG tablet Take 1 tablet (50 mg total) by mouth daily. 07/02/17 10/23/23 Yes Croitoru, Mihai, MD  Chlorpheniramine-DM 4-30 MG TABS Take 2 tablets a need every 6 hours a needed for symptomatic relief Patient not  taking: Reported on 10/22/2017 08/07/17   Berton BonMikell, Asiyah Zahra, MD  sulfacetamide (BLEPH-10) 10 % ophthalmic solution Place 1-2 drops into the left eye every 2 (two) hours while awake. Patient not taking: Reported on 10/22/2017 07/12/16   Deatra Canterxford, William J, FNP    Physical Exam: Vitals:   10/21/17 2104 10/22/17 0020 10/22/17 0030 10/22/17 0100  BP: (!) 144/88 129/78 (!) 127/99 (!) 118/53  Pulse: 89 88 83 93  Resp: 20 12 (!) 23 (!) 26  Temp: 98.4 F (36.9 C)     TempSrc: Oral     SpO2: 95% 98% 96% 93%  Weight:      Height:          Constitutional: Moderately built and nourished. Vitals:   10/21/17 2104 10/22/17 0020 10/22/17 0030 10/22/17 0100  BP: (!) 144/88 129/78 (!) 127/99 (!) 118/53  Pulse: 89 88 83 93  Resp: 20 12 (!) 23 (!) 26  Temp: 98.4 F (36.9 C)     TempSrc: Oral     SpO2: 95% 98% 96% 93%  Weight:      Height:       Eyes: Anicteric no pallor. ENMT: No discharge from the ears eyes nose or mouth. Neck: No mass felt.  JVD elevated. Respiratory: No rhonchi or crepitations. Cardiovascular: S1-S2 heard no murmurs appreciated. Abdomen: Soft nontender bowel sounds present. Musculoskeletal: Bilateral lower extremity edema extending up to the thigh. Skin: No rash. Neurologic: Alert awake oriented to time place and person.  Moves all extremities. Psychiatric: Appears normal.  Normal affect.   Labs on Admission: I have personally reviewed following labs and imaging studies  CBC: Recent Labs  Lab 10/21/17 1807  WBC 8.4  HGB 16.1  HCT 51.0  MCV 82.4  PLT 251   Basic Metabolic Panel: Recent Labs  Lab 10/21/17 1807  NA 139  K 4.1  CL 100*  CO2 30  GLUCOSE 122*  BUN 17  CREATININE 1.11  CALCIUM 8.4*   GFR: Estimated Creatinine Clearance: 157.3 mL/min (by C-G formula based on SCr of 1.11 mg/dL). Liver Function Tests: No results for input(s): AST, ALT, ALKPHOS, BILITOT, PROT, ALBUMIN in the last 168 hours. No results for input(s): LIPASE, AMYLASE in the  last 168 hours. No results for input(s): AMMONIA in the last 168 hours. Coagulation Profile: No results for input(s): INR, PROTIME in the last 168 hours. Cardiac Enzymes: No results for input(s): CKTOTAL, CKMB, CKMBINDEX, TROPONINI in the last 168 hours. BNP (last 3 results) No results for input(s): PROBNP in the last 8760 hours. HbA1C: No results for input(s): HGBA1C in the last 72 hours. CBG: No results for input(s): GLUCAP in the last 168 hours. Lipid Profile: No results for input(s): CHOL, HDL, LDLCALC, TRIG, CHOLHDL, LDLDIRECT in the last 72 hours. Thyroid Function Tests: No results for input(s): TSH,  T4TOTAL, FREET4, T3FREE, THYROIDAB in the last 72 hours. Anemia Panel: No results for input(s): VITAMINB12, FOLATE, FERRITIN, TIBC, IRON, RETICCTPCT in the last 72 hours. Urine analysis:    Component Value Date/Time   COLORURINE YELLOW 03/05/2015 1155   APPEARANCEUR CLEAR 03/05/2015 1155   LABSPEC 1.012 03/05/2015 1155   PHURINE 5.5 03/05/2015 1155   GLUCOSEU NEGATIVE 03/05/2015 1155   HGBUR TRACE (A) 03/05/2015 1155   BILIRUBINUR NEGATIVE 03/05/2015 1155   KETONESUR NEGATIVE 03/05/2015 1155   PROTEINUR 100 (A) 03/05/2015 1155   UROBILINOGEN 1.0 03/05/2015 1155   NITRITE NEGATIVE 03/05/2015 1155   LEUKOCYTESUR NEGATIVE 03/05/2015 1155   Sepsis Labs: @LABRCNTIP (procalcitonin:4,lacticidven:4) )No results found for this or any previous visit (from the past 240 hour(s)).   Radiological Exams on Admission: Dg Chest 2 View  Result Date: 10/21/2017 CLINICAL DATA:  CHF 2 weeks. EXAM: CHEST  2 VIEW COMPARISON:  03/05/2015 FINDINGS: Lungs are hypoinflated without lobar consolidation or effusion. There is mild prominence of the perihilar markings unchanged to slightly improved. Moderate stable cardiomegaly. Remainder of the exam is unchanged. IMPRESSION: Moderate stable cardiomegaly with mild vascular congestion which is unchanged to slightly improved. Electronically Signed   By:  Elberta Fortis M.D.   On: 10/21/2017 19:02    EKG: Independently reviewed.  Normal sinus rhythm with RBBB.  Assessment/Plan Active Problems:   Acute combined systolic and diastolic congestive heart failure (HCC)   OSA (obstructive sleep apnea)   Morbid obesity (HCC)   CHF (congestive heart failure) (HCC)    1. Acute on chronic systolic and diastolic CHF last EF measured was in March 2016 EF was 40% with pulmonary pressure of 45% -patient has been placed on Lasix 80 mg IV every 12.  Restart patient's BiDil and lisinopril.  Closely follow intake output and metabolic panel and daily weights. 2. Sleep apnea on BiPAP. 3. Hypertension on BiDil lisinopril and amlodipine.  Closely follow blood pressure trends.  History of diabetes mellitus as per the chart.  Patient is not on any medications.  Closely follow metabolic panel.   DVT prophylaxis: Lovenox. Code Status: Full code. Family Communication: Discussed with patient. Disposition Plan: Home. Consults called: None. Admission status: Inpatient.   Eduard Clos MD [facility] Pager [phone].  If 7PM-7AM, please contact night-coverage [email or web address] Password [email or web address]  10/22/2017, 3:41 AM

## 2017-10-22 NOTE — ED Provider Notes (Signed)
TIME SEEN: 12:13 AM  CHIEF COMPLAINT: CHF  HPI: Patient is a 36 year old male with history of morbid obesity, hypertension, diabetes, CHF with EF of 40% in March 2016, second degree heart block, history of nonsustained ventricular tachycardia who presents to the emergency department with shortness of breath.  Patient states that he feels like he has too much fluid on him.  He states he has had a dry cough for several weeks.  Also reports a weight gain of 70 pounds over the past month.  He states that he has had leg swelling but now goes into his abdomen.  He denies having fever.  Reports he has some chest tightness.  Reports because of insurance reasons he has not been able to fill his Lasix and has not been taking his 80 mg twice daily as prescribed and has been only taking it once a day intermittently.  Does not wear oxygen at home.  ROS: See HPI Constitutional: no fever  Eyes: no drainage  ENT: no runny nose   Cardiovascular:   chest pain  Resp:  SOB  GI: no vomiting GU: no dysuria Integumentary: no rash  Allergy: no hives  Musculoskeletal: no leg swelling  Neurological: no slurred speech ROS otherwise negative  PAST MEDICAL HISTORY/PAST SURGICAL HISTORY:  Past Medical History:  Diagnosis Date  . 2nd degree AV block    a. noted during 02/2015 admission - second degree AV AV block (not further defined in notes). Not on BB due to this.  . Asthma    Childhood  . Bradycardia    a. noted during 02/2015 admission - second degree AV AV block (not further defined in notes). Not on BB due to this.  . Chronic combined systolic and diastolic CHF (congestive heart failure) (HCC)    a. echo 03/06/15 showing mild LVH, EF 40%, high ventricular filling pressures, mod MR, mildly dilated RV/mildly reduced RV function, mildly dilated RA, PASP .  . Diabetes mellitus, type 2 (HCC)   . Essential hypertension   . Family history of adverse reaction to anesthesia    mother and aunt  both put to sleep  for surgery and "stopped breathing"  . Morbid obesity (HCC)   . NSVT (nonsustained ventricular tachycardia) (HCC)    a. noted during 02/2015 admission.  . OSA (obstructive sleep apnea)    a. noncompliance with CPAP - wakes up with it off.    MEDICATIONS:  Prior to Admission medications   Medication Sig Start Date End Date Taking? Authorizing Provider  amLODipine (NORVASC) 10 MG tablet Take 1 tablet (10 mg total) by mouth daily. 12/28/16 08/13/17  Azalee Course, PA  cetirizine (ZYRTEC) 10 MG tablet Take 1 tablet (10 mg total) by mouth daily. 08/07/17   Berton Bon, MD  Chlorpheniramine-DM 4-30 MG TABS Take 2 tablets a need every 6 hours a needed for symptomatic relief 08/07/17   Mikell, Antionette Poles, MD  fluticasone (FLONASE) 50 MCG/ACT nasal spray Place 1 spray into both nostrils daily. 1 spray in each nostril every day 08/07/17   Berton Bon, MD  furosemide (LASIX) 80 MG tablet TAKE 1 TABLET (80 MG TOTAL) BY MOUTH 2 (TWO) TIMES DAILY. 12/07/16   Azalee Course, PA  isosorbide-hydrALAZINE (BIDIL) 20-37.5 MG tablet Take 1 tablet by mouth 3 (three) times daily. 12/07/16   Azalee Course, PA  lisinopril (PRINIVIL,ZESTRIL) 20 MG tablet Take 1 tablet (20 mg total) by mouth 2 (two) times daily. 12/07/16   Azalee Course, PA  metolazone (ZAROXOLYN) 2.5  MG tablet Take 1 tab (2.5) tab by mouth Tuesday, Thursday and Saturday 07/02/17   Croitoru, Mihai, MD  spironolactone (ALDACTONE) 50 MG tablet Take 1 tablet (50 mg total) by mouth daily. 07/02/17 09/30/17  Croitoru, Mihai, MD  sulfacetamide (BLEPH-10) 10 % ophthalmic solution Place 1-2 drops into the left eye every 2 (two) hours while awake. 07/12/16   Deatra Canterxford, William J, FNP    ALLERGIES:  Allergies  Allergen Reactions  . Penicillins Swelling    SOCIAL HISTORY:  Social History   Tobacco Use  . Smoking status: Current Every Day Smoker    Packs/day: 0.50    Years: 19.00    Pack years: 9.50    Types: Cigarettes    Start date: 01/05/1996    Last  attempt to quit: 03/04/2015    Years since quitting: 2.6  . Smokeless tobacco: Never Used  . Tobacco comment: Already decreasaed smoking  Substance Use Topics  . Alcohol use: No    Alcohol/week: 0.6 - 1.2 oz    Types: 1 - 2 Standard drinks or equivalent per week    Comment: Occasional    FAMILY HISTORY: Family History  Problem Relation Age of Onset  . Hypertension Mother   . Hyperlipidemia Mother   . Heart failure Mother   . Multiple sclerosis Mother   . Breast cancer Maternal Grandmother   . Diabetes Maternal Grandmother   . Colon cancer Maternal Uncle     EXAM: BP (!) 144/88 (BP Location: Left Arm)   Pulse 89   Temp 98.4 F (36.9 C) (Oral)   Resp 20   Ht 6\' 1"  (1.854 m)   Wt (!) 182.5 kg (402 lb 4.8 oz)   SpO2 95%   BMI 53.08 kg/m  CONSTITUTIONAL: Alert and oriented and responds appropriately to questions.  Chronically ill-appearing, morbidly obese, afebrile, nontoxic HEAD: Normocephalic EYES: Conjunctivae clear, pupils appear equal, EOMI ENT: normal nose; moist mucous membranes NECK: Supple, no meningismus, no nuchal rigidity, no LAD  CARD: RRR; S1 and S2 appreciated; no murmurs, no clicks, no rubs, no gallops RESP: Normal chest excursion without splinting or tachypnea; breath sounds clear and equal bilaterally; no wheezes, no rhonchi, no rales, patient is hypoxic on room air, speak short sentences, seems to get short of breath very easily, exam is limited given his morbid obesity ABD/GI: Normal bowel sounds; non-distended; soft, non-tender, no rebound, no guarding, no peritoneal signs, no hepatosplenomegaly BACK:  The back appears normal and is non-tender to palpation, there is no CVA tenderness EXT: Normal ROM in all joints; non-tender to palpation; patient has pitting edema from his feet all the way to the lower abdomen; normal capillary refill; no cyanosis, no calf tenderness or swelling    SKIN: Normal color for age and race; warm; no rash NEURO: Moves all  extremities equally PSYCH: The patient's mood and manner are appropriate. Grooming and personal hygiene are appropriate.  MEDICAL DECISION MAKING: Patient here with CHF, volume overload.  He is hypoxic on room air.  He has significant peripheral edema.  Will give IV Lasix.  Chest x-ray shows stable cardiomegaly with vascular congestion.  EKG shows sinus tachycardia.  Second troponin mildly elevated.  I feel he will need admission especially given he has a new oxygen requirement.  Patient comfortable with this plan.  His PCP is Dr. Carver Filaalone with [facility].  ED PROGRESS: 2:30 AM Discussed patient's case with hospitalist, Dr. Toniann FailKakrakandy.  I have recommended admission and patient (and family if present) agree with this plan.  Admitting physician will place admission orders.   I reviewed all nursing notes, vitals, pertinent previous records, EKGs, lab and urine results, imaging (as available).       EKG Interpretation  Date/Time:  Monday October 21 2017 18:03:47 EST Ventricular Rate:  106 PR Interval:  146 QRS Duration: 146 QT Interval:  400 QTC Calculation: 531 R Axis:   -133 Text Interpretation:  Sinus tachycardia Right atrial enlargement Right bundle branch block Abnormal ECG No significant change since last tracing Confirmed by Wissam Resor, Baxter Hire [phone]) on 10/22/2017 12:13:25 AM          Itai Barbian, Layla Maw, DO 10/22/17 0230

## 2017-10-23 ENCOUNTER — Other Ambulatory Visit (HOSPITAL_COMMUNITY): Payer: Managed Care, Other (non HMO)

## 2017-10-23 LAB — GLUCOSE, CAPILLARY
GLUCOSE-CAPILLARY: 155 mg/dL — AB (ref 65–99)
GLUCOSE-CAPILLARY: 133 mg/dL — AB (ref 65–99)
Glucose-Capillary: 95 mg/dL (ref 65–99)
Glucose-Capillary: 91 mg/dL (ref 65–99)

## 2017-10-23 LAB — BASIC METABOLIC PANEL
Anion gap: 7 (ref 5–15)
BUN: 21 mg/dL — AB (ref 6–20)
CALCIUM: 8.6 mg/dL — AB (ref 8.9–10.3)
CO2: 41 mmol/L — AB (ref 22–32)
Chloride: 93 mmol/L — ABNORMAL LOW (ref 101–111)
Creatinine, Ser: 1.55 mg/dL — ABNORMAL HIGH (ref 0.61–1.24)
GFR calc Af Amer: >60 mL/min (ref >60)
GFR, EST NON AFRICAN AMERICAN: 56 mL/min — AB (ref >60)
GLUCOSE: 122 mg/dL — AB (ref 65–99)
Potassium: 4.8 mmol/L (ref 3.5–5.1)
Sodium: 141 mmol/L (ref 135–145)

## 2017-10-23 LAB — MAGNESIUM: Magnesium: 2 mg/dL (ref 1.7–2.4)

## 2017-10-23 NOTE — Care Management Note (Signed)
Case Management Note  Patient Details  Name: Roger Ward MRN: [id number] Date of Birth: [birth year]  Subjective/Objective:    CHF               Action/Plan: Patient lives at home with spouse, works full time with [employer]; PCP: Veryl Speak, FNP; has private insurance with [employer] with prescription drug coverage; pharmacy of choice is [facility]; he is independent of all of his ADL's; Long discussion with patient about making lifestyle change, diet, activity, taking medication as prescribed; emotional support given. No needs identified. CM will continue to follow for DCP.  Expected Discharge Date:   possibly 10/27/2017               Expected Discharge Plan:  Home/Self Care  In-House Referral:   Dietitian  Discharge planning Services  CM Consult    Status of Service:  In process, will continue to follow  Reola Mosher [phone] 10/23/2017, 1:28 PM

## 2017-10-23 NOTE — Progress Notes (Signed)
Patient had 10 beat run of ventricular tachycardia returning to sinus rhythm then another 3 beat run ventricular tachycardia returning to sinus rhythm while asleep.Patient awaken denies chest pain ,palpitations, or shortness of breath .Blood pressure 124/69 HR 92 oxygen saturations 97% on cpap with 4 liters oxygen.Dr.Kakrakandy notified.New order received to obtain labs for this morning.Will continue to monitor patient.

## 2017-10-23 NOTE — Progress Notes (Signed)
PROGRESS NOTE    Roger Ward  UJW:[id number] DOB: 03/17/1981 DOA: 10/21/2017 PCP: Veryl Ward, Roger D, FNP  Brief Narrative:36 y.o. male with history of chronic systolic and diastolic heart failure, morbid obesity, sleep apnea presents to the ER because of worsening shortness of breath.  Patient states over the last few days patient has been getting progressively short of breath.  Patient states he has not taken some of his medications including diuretics due to problem with finances.  He has gained weight.  Denies any chest pain productive cough fever chills.  ED Course: In the ER patient is found to be hypoxic with chest x-ray showing congestion.  BNP was elevated.  Troponin was mildly elevated.  EKG shows sinus rhythm with RBBB.  Patient has gained at least 25 pounds from recent past.  Patient was given Lasix 80 mg IV admitted for CHF management.  Patient reports feeling better today after the IV diuresis.  Patient had 10 run of V. tach with no symptoms.  His labs have been drawn today potassium and magnesium are within normal limits.  Patient denies any chest pain.  Still feels short of breath but however feels way better than yesterday.   Assessment & Plan:   Active Problems:   Acute combined systolic and diastolic congestive heart failure (HCC)   OSA (obstructive sleep apnea)   Diabetes mellitus, type 2 (HCC)   Morbid obesity (HCC)   Demand ischemia (HCC)   Acute hypoxemic respiratory failure (HCC)  Acute on chronic combined diastolic and systolic CHF exacerbation.  Ejection fraction 40% by echocardiogram in March 2016.  Will repeat another echo to evaluate his ejection fraction.  Upon admission patient had elevated BNP and chest x-ray consistent with mild fluid overload pulmonary vascular congestion. continue IV diuresis ACE inhibitors and bidil.  He has lost over 4.5 kg since admission.  Nonsustained V. tach patient asymptomatic.  Electrolytes within normal limits.  Sleep apnea  patient has CPAP at home.  Continue CPAP here.  Hypertension continue current medications.      DVT prophylaxis Lovenox :Code Status full code :Family Communication: Discussed with patient Disposition Plan Home Consultants: None  Procedures none Antimicrobials none Subjective: States he feels better with breathing. Objective: Vitals:   10/23/17 0113 10/23/17 0352 10/23/17 0447 10/23/17 1022  BP: 118/61 124/69 106/71 134/81  Pulse: 99 92 88 95  Resp: (!) 22 20 20    Temp: (!) 97.4 F (36.3 C)  (!) 97.4 F (36.3 C)   TempSrc: Oral  Oral   SpO2: 95% 97% 97% 100%  Weight:   (!) 178.4 kg (393 lb 4.8 oz)   Height:        Intake/Output Summary (Last 24 hours) at 10/23/2017 1057 Last data filed at 10/23/2017 1026 Gross per 24 hour  Intake 480 ml  Output 2375 ml  Net -1895 ml   Filed Weights   10/21/17 1800 10/22/17 1503 10/23/17 0447  Weight: (!) 182.5 kg (402 lb 4.8 oz) (!) 177.6 kg (391 lb 8.6 oz) (!) 178.4 kg (393 lb 4.8 oz)    Examination:  General exam: Appears calm and comfortable  Respiratory system: Clear to auscultation. Respiratory effort normal.  Decreased breath sounds at the bases  cardiovascular system: S1 & S2 heard, RRR. No JVD, murmurs, rubs, gallops or clicks. No pedal edema. Gastrointestinal system: Abdomen is nondistended, soft and nontender. No organomegaly or masses felt. Normal bowel sounds heard. Central nervous system: Alert and oriented. No focal neurological deficits. Extremities: Symmetric 5 x  5 power. Skin: No rashes, lesions or ulcers Psychiatry: Judgement and insight appear normal. Mood & affect appropriate.     Data Reviewed: I have personally reviewed following labs and imaging studies  CBC: Recent Labs  Lab 10/21/17 1807 10/22/17 0356  WBC 8.4 7.3  HGB 16.1 16.1  HCT 51.0 52.2*  MCV 82.4 82.9  PLT 251 262   Basic Metabolic Panel: Recent Labs  Lab 10/21/17 1807 10/22/17 0356 10/23/17 0454  NA 139  --  141  K 4.1  --   4.8  CL 100*  --  93*  CO2 30  --  41*  GLUCOSE 122*  --  122*  BUN 17  --  21*  CREATININE 1.11 1.20 1.55*  CALCIUM 8.4*  --  8.6*  MG  --  1.9 2.0   GFR: Estimated Creatinine Clearance: 111.2 mL/min (A) (by C-G formula based on SCr of 1.55 mg/dL (H)). Liver Function Tests: No results for input(s): AST, ALT, ALKPHOS, BILITOT, PROT, ALBUMIN in the last 168 hours. No results for input(s): LIPASE, AMYLASE in the last 168 hours. No results for input(s): AMMONIA in the last 168 hours. Coagulation Profile: No results for input(s): INR, PROTIME in the last 168 hours. Cardiac Enzymes: Recent Labs  Lab 10/22/17 0357 10/22/17 0935 10/22/17 1440  TROPONINI 0.06* 0.05* 0.05*   BNP (last 3 results) No results for input(s): PROBNP in the last 8760 hours. HbA1C: No results for input(s): HGBA1C in the last 72 hours. CBG: Recent Labs  Lab 10/23/17 0746  GLUCAP 95   Lipid Profile: No results for input(s): CHOL, HDL, LDLCALC, TRIG, CHOLHDL, LDLDIRECT in the last 72 hours. Thyroid Function Tests: Recent Labs    10/22/17 0356  TSH 0.789   Anemia Panel: No results for input(s): VITAMINB12, FOLATE, FERRITIN, TIBC, IRON, RETICCTPCT in the last 72 hours. Sepsis Labs: No results for input(s): PROCALCITON, LATICACIDVEN in the last 168 hours.  No results found for this or any previous visit (from the past 240 hour(s)).       Radiology Studies: Dg Chest 2 View  Result Date: 10/21/2017 CLINICAL DATA:  CHF 2 weeks. EXAM: CHEST  2 VIEW COMPARISON:  03/05/2015 FINDINGS: Lungs are hypoinflated without lobar consolidation or effusion. There is mild prominence of the perihilar markings unchanged to slightly improved. Moderate stable cardiomegaly. Remainder of the exam is unchanged. IMPRESSION: Moderate stable cardiomegaly with mild vascular congestion which is unchanged to slightly improved. Electronically Signed   By: Elberta Fortis M.D.   On: 10/21/2017 19:02        Scheduled Meds: .  enoxaparin (LOVENOX) injection  80 mg Subcutaneous Q24H  . fluticasone  1 spray Each Nare Daily  . furosemide  80 mg Intravenous Q12H  . isosorbide-hydrALAZINE  1 tablet Oral TID  . lisinopril  20 mg Oral BID  . loratadine  10 mg Oral Daily  . metolazone  2.5 mg Oral Once per day on Mon Wed Fri  . spironolactone  50 mg Oral Daily   Continuous Infusions:   LOS: 1 day     Alwyn Ren, MD [facility]   If 7PM-7AM, please contact night-coverage [email or web address] Password [email or web address] 10/23/2017, 10:57 AM

## 2017-10-23 NOTE — Plan of Care (Signed)
Nutrition Education Note  RD consulted for nutrition education regarding CHF.  Spoke with pt, who reports he was diagnosed with heart failure about 3-4 years ago. He shares that he was compliant with diet, but "fell off the wagon". Pt shares that often eats his meals at fast food restaurants (Breakfast: sausage, egg, and cheese biscuit with soft drink; Lunch and Dinner: value meal at [employer]- ex. Burgers, french fries, and soft drink). Pt reports that he knows that the foods that he eats are high in sodium and "I shouldn't be eating them". Pt feels that he can get back on track and feels confident to make changes as he has done so in the past. Education session was focused on healthier, low sodium options at fast food restaurants.   RD provided "Low Sodium Nutrition Therapy" and "Sodium Free Seasoning Tips" handouts from the Academy of [employer] and Dietetics. Reviewed patient's dietary recall. Provided examples on ways to decrease sodium intake in diet. Discouraged intake of processed foods and use of salt shaker. Encouraged fresh fruits and vegetables as well as whole grain sources of carbohydrates to maximize fiber intake.   RD discussed why it is important for patient to adhere to diet recommendations, and emphasized the role of fluids, foods to avoid, and importance of weighing self daily. Teach back method used.  Expect fair  compliance.  Body mass index is 51.89 kg/m. Pt meets criteria for extreme obesity, class III based on current BMI.  Current diet order is Heart Healthy, patient is consuming approximately 100% of meals at this time. Labs and medications reviewed. No further nutrition interventions warranted at this time. RD contact information provided. If additional nutrition issues arise, please re-consult RD.   Meesha Sek A. Mayford Knife, RD, LDN, CDE Pager: [phone] After hours Pager: [phone]

## 2017-10-24 DIAGNOSIS — R0902 Hypoxemia: Secondary | ICD-10-CM

## 2017-10-24 LAB — BASIC METABOLIC PANEL
ANION GAP: 9 (ref 5–15)
BUN: 18 mg/dL (ref 6–20)
CALCIUM: 8.7 mg/dL — AB (ref 8.9–10.3)
CO2: 40 mmol/L — AB (ref 22–32)
Chloride: 91 mmol/L — ABNORMAL LOW (ref 101–111)
Creatinine, Ser: 1.33 mg/dL — ABNORMAL HIGH (ref 0.61–1.24)
GFR calc Af Amer: >60 mL/min (ref >60)
GFR calc non Af Amer: >60 mL/min (ref >60)
GLUCOSE: 106 mg/dL — AB (ref 65–99)
Potassium: 4.7 mmol/L (ref 3.5–5.1)
Sodium: 140 mmol/L (ref 135–145)

## 2017-10-24 MED ORDER — METOLAZONE 2.5 MG PO TABS
2.5000 mg | ORAL_TABLET | Freq: Every day | ORAL | Status: DC
  Administered 2017-10-25 – 2017-10-28 (×4): 2.5 mg via ORAL
  Filled 2017-10-24 (×5): qty 1

## 2017-10-24 NOTE — Progress Notes (Signed)
Patient had 14 beats of V-tach. Patient is asymptomatic and MD was notified. Will continue to Monitor.   Elsie Lincoln, RN

## 2017-10-24 NOTE — Plan of Care (Signed)
  Progressing Education: Ability to demonstrate management of disease process will improve 10/24/2017 0243 - Progressing by Olena Mater, RN Activity: Capacity to carry out activities will improve 10/24/2017 0243 - Progressing by Olena Mater, RN Cardiac: Ability to achieve and maintain adequate cardiopulmonary perfusion will improve 10/24/2017 0243 - Progressing by Olena Mater, RN Talked with pt. about watching fluid restriction, and elevating legs more.

## 2017-10-24 NOTE — Progress Notes (Signed)
PROGRESS NOTE    Roger Ward  [id number] DOB: 07/11/1981 DOA: 10/21/2017 PCP: Veryl Speakalone, Gregory D, FNP  Brief Narrative: 36 y.o.malewithhistory of chronic systolic and diastolic heart failure, morbid obesity, sleep apnea presents to the ER because of worsening shortness of breath. Patient states over the last few days patient has been getting progressively short of breath. Patient states he has not taken some of his medications including diuretics due to problem with finances. He has gained weight. Denies any chest pain productive cough fever chills.  ED Course:In the ER patient is found to be hypoxic with chest x-ray showing congestion. BNP was elevated. Troponin was mildly elevated. EKG shows sinus rhythm with RBBB. Patient has gained at least 25 pounds from recent past. Patient was given Lasix 80 mg IV admitted for CHF management.  Patient reports feeling better today after the IV diuresis.  Patient had 10 run of V. tach with no symptoms.  His labs have been drawn today potassium and magnesium are within normal limits.  Patient denies any chest pain.  Still feels short of breath but however feels way better than yesterday.   Assessment & Plan:   Active Problems:   Acute combined systolic and diastolic congestive heart failure (HCC)   OSA (obstructive sleep apnea)   Diabetes mellitus, type 2 (HCC)   Morbid obesity (HCC)   Demand ischemia (HCC)   Acute hypoxemic respiratory failure (HCC)  Acute on chronic combined diastolic and systolic CHF exacerbation repeat echocardiogram that was done yesterday showed ejection fraction 35%.  I will continue IV Lasix twice a day along with the metolazone 2.5 mg daily.  His intake and output is negative by 1800 cc.  Continue ACE inhibitors.  He still has a good amount of fluid overload so I will continue IV diuresis again today.  History of sleep apnea patient uses CPAP here.  He does have a CPAP at home . Hypertension stable on  current medications.  DVT prophylaxis:LOVENOX Family Communication: DW PATIENT Disposition Plan: HOME  Consultants:NONE  Procedures: None Antimicrobials:  None Subjective: Feels better   Objective: Sitting by the side of the bed with a CPAP on appears in no acute distress Vitals:   10/23/17 1022 10/23/17 1149 10/23/17 1941 10/24/17 0512  BP: 134/81 102/64 (!) 127/56 103/61  Pulse: 95 84 92 92  Resp:  20 20 20   Temp:  98.7 F (37.1 C) 98.4 F (36.9 C) 98.6 F (37 C)  TempSrc:  Oral Oral Oral  SpO2: 100% 91% 96% 95%  Weight:    (!) 176.9 kg (390 lb 1.6 oz)  Height:        Intake/Output Summary (Last 24 hours) at 10/24/2017 1020 Last data filed at 10/24/2017 0500 Gross per 24 hour  Intake 720 ml  Output 2525 ml  Net -1805 ml   Filed Weights   10/22/17 1503 10/23/17 0447 10/24/17 0512  Weight: (!) 177.6 kg (391 lb 8.6 oz) (!) 178.4 kg (393 lb 4.8 oz) (!) 176.9 kg (390 lb 1.6 oz)    Examination:  General exam: Appears calm and comfortable  Respiratory system: Clear to auscultation. Respiratory effort normal.  Decreased breath sounds at the bases Cardiovascular system: S1 & S2 heard, RRR. No JVD, murmurs, rubs, gallops or clicks. No pedal edema. Gastrointestinal system: Abdomen is nondistended, soft and nontender. No organomegaly or masses felt. Normal bowel sounds heard. Central nervous system: Alert and oriented. No focal neurological deficits. Extremities: 2+ pitting edema Skin: No rashes, lesions or ulcers Psychiatry:  Judgement and insight appear normal. Mood & affect appropriate.     Data Reviewed: I have personally reviewed following labs and imaging studies  CBC: Recent Labs  Lab 10/21/17 1807 10/22/17 0356  WBC 8.4 7.3  HGB 16.1 16.1  HCT 51.0 52.2*  MCV 82.4 82.9  PLT 251 262   Basic Metabolic Panel: Recent Labs  Lab 10/21/17 1807 10/22/17 0356 10/23/17 0454 10/24/17 0608  NA 139  --  141 140  K 4.1  --  4.8 4.7  CL 100*  --  93* 91*    CO2 30  --  41* 40*  GLUCOSE 122*  --  122* 106*  BUN 17  --  21* 18  CREATININE 1.11 1.20 1.55* 1.33*  CALCIUM 8.4*  --  8.6* 8.7*  MG  --  1.9 2.0  --    GFR: Estimated Creatinine Clearance: 128.9 mL/min (A) (by C-G formula based on SCr of 1.33 mg/dL (H)). Liver Function Tests: No results for input(s): AST, ALT, ALKPHOS, BILITOT, PROT, ALBUMIN in the last 168 hours. No results for input(s): LIPASE, AMYLASE in the last 168 hours. No results for input(s): AMMONIA in the last 168 hours. Coagulation Profile: No results for input(s): INR, PROTIME in the last 168 hours. Cardiac Enzymes: Recent Labs  Lab 10/22/17 0357 10/22/17 0935 10/22/17 1440  TROPONINI 0.06* 0.05* 0.05*   BNP (last 3 results) No results for input(s): PROBNP in the last 8760 hours. HbA1C: No results for input(s): HGBA1C in the last 72 hours. CBG: Recent Labs  Lab 10/23/17 0746 10/23/17 1110 10/23/17 1632 10/23/17 2118  GLUCAP 95 91 155* 133*   Lipid Profile: No results for input(s): CHOL, HDL, LDLCALC, TRIG, CHOLHDL, LDLDIRECT in the last 72 hours. Thyroid Function Tests: Recent Labs    10/22/17 0356  TSH 0.789   Anemia Panel: No results for input(s): VITAMINB12, FOLATE, FERRITIN, TIBC, IRON, RETICCTPCT in the last 72 hours. Sepsis Labs: No results for input(s): PROCALCITON, LATICACIDVEN in the last 168 hours.  No results found for this or any previous visit (from the past 240 hour(s)).       Radiology Studies: No results found.      Scheduled Meds: . fluticasone  1 spray Each Nare Daily  . furosemide  80 mg Intravenous Q12H  . isosorbide-hydrALAZINE  1 tablet Oral TID  . lisinopril  20 mg Oral BID  . loratadine  10 mg Oral Daily  . metolazone  2.5 mg Oral Once per day on Mon Wed Fri  . spironolactone  50 mg Oral Daily   Continuous Infusions:   LOS: 2 days     Alwyn Ren, MD [facility]  If 7PM-7AM, please contact night-coverage [email or web address] Password  [email or web address] 10/24/2017, 10:20 AM

## 2017-10-25 DIAGNOSIS — I248 Other forms of acute ischemic heart disease: Secondary | ICD-10-CM

## 2017-10-25 DIAGNOSIS — I5041 Acute combined systolic (congestive) and diastolic (congestive) heart failure: Secondary | ICD-10-CM

## 2017-10-25 LAB — BASIC METABOLIC PANEL
ANION GAP: 11 (ref 5–15)
BUN: 15 mg/dL (ref 6–20)
CHLORIDE: 89 mmol/L — AB (ref 101–111)
CO2: 38 mmol/L — AB (ref 22–32)
Calcium: 9.2 mg/dL (ref 8.9–10.3)
Creatinine, Ser: 1.14 mg/dL (ref 0.61–1.24)
GFR calc Af Amer: >60 mL/min (ref >60)
GLUCOSE: 95 mg/dL (ref 65–99)
POTASSIUM: 3.8 mmol/L (ref 3.5–5.1)
Sodium: 138 mmol/L (ref 135–145)

## 2017-10-25 MED ORDER — CARVEDILOL 3.125 MG PO TABS
3.1250 mg | ORAL_TABLET | Freq: Two times a day (BID) | ORAL | Status: DC
  Administered 2017-10-25 – 2017-10-30 (×10): 3.125 mg via ORAL
  Filled 2017-10-25 (×11): qty 1

## 2017-10-25 MED ORDER — POTASSIUM CHLORIDE CRYS ER 20 MEQ PO TBCR
20.0000 meq | EXTENDED_RELEASE_TABLET | Freq: Two times a day (BID) | ORAL | Status: DC
  Administered 2017-10-25 – 2017-10-27 (×5): 20 meq via ORAL
  Filled 2017-10-25 (×5): qty 1

## 2017-10-25 MED ORDER — MAGNESIUM SULFATE 2 GM/50ML IV SOLN
2.0000 g | Freq: Once | INTRAVENOUS | Status: AC
  Administered 2017-10-25: 2 g via INTRAVENOUS
  Filled 2017-10-25: qty 50

## 2017-10-25 MED ORDER — POTASSIUM CHLORIDE CRYS ER 20 MEQ PO TBCR
40.0000 meq | EXTENDED_RELEASE_TABLET | Freq: Once | ORAL | Status: AC
  Administered 2017-10-25: 40 meq via ORAL
  Filled 2017-10-25: qty 2

## 2017-10-25 NOTE — Progress Notes (Signed)
Responded to spiritual care consult to support patient who was adjusting to a different life style.  Consulted with patient's nurse prior to visit  Patient indicated that he was doing well and had good support and that his wife visited him regularly. Patient was sitting in chair watching. Patient expressed he had no major concerns just ready to go home when staff says it time. Provided presence, listening and emotional support. Chaplain will follow as needed. Venida Jarvis, [place], [facility], Pager [phone]

## 2017-10-25 NOTE — Plan of Care (Signed)
  Progressing Health Behavior/Discharge Planning: Ability to manage health-related needs will improve 10/25/2017 0958 - Progressing by Marice Potter, RN   Completed/Met Nutrition: Adequate nutrition will be maintained 10/25/2017 0958 - Completed/Met by Marice Potter, RN Safety: Ability to remain free from injury will improve 10/25/2017 0958 - Completed/Met by Marice Potter, RN

## 2017-10-25 NOTE — Progress Notes (Addendum)
Patient mention forgot to tell MD about new burning sensation in big toes. I assessed feet. Pulses are present but weak. No complaints of numbness. MD paged with information  [phone]- MD called back and is aware. No new orders at this time

## 2017-10-25 NOTE — Progress Notes (Signed)
PROGRESS NOTE    Roger Ward  ZOX:[id number] DOB: 03/28/1981 DOA: 10/21/2017 PCP: Veryl Speakalone, Gregory D, FNP  Brief Narrative:36 y.o.malewithhistory of chronic systolic and diastolic heart failure, morbid obesity, sleep apnea presents to the ER because of worsening shortness of breath. Patient states over the last few days patient has been getting progressively short of breath. Patient states he has not taken some of his medications including diuretics due to problem with finances. He has gained weight. Denies any chest pain productive cough fever chills.  ED Course:In the ER patient is found to be hypoxic with chest x-ray showing congestion. BNP was elevated. Troponin was mildly elevated. EKG shows sinus rhythm with RBBB. Patient has gained at least 25 pounds from recent past. Patient was given Lasix 80 mg IV admitted for CHF management.  Patient reports feeling better today after the IV diuresis. Patient had 10 run of V. tach with no symptoms. His labs have been drawn today potassium and magnesium are within normal limits. Patient denies any chest pain. Still feels short of breath but however feels way better than yesterday. Patient sitting up in his chair watching TV. he had few beats of V. tach again early this morning.  He reports that he did not have any symptoms of chest pain or shortness of breath.  He uses a CPAP every night.    Assessment & Plan:   Active Problems:   Acute combined systolic and diastolic congestive heart failure (HCC)   OSA (obstructive sleep apnea)   Diabetes mellitus, type 2 (HCC)   Morbid obesity (HCC)   Demand ischemia (HCC)   Acute hypoxemic respiratory failure (HCC)  Acute on chronic combined diastolic and systolic CHF exacerbation repeat echocardiogram that was done yesterday showed ejection fraction 35%.  I will continue IV Lasix twice a day along with the metolazone 2.5 mg daily.  His intake and output is negative by 1800 cc.  Continue  ACE inhibitors.  He still has a good amount of fluid overload so I will continue IV diuresis again today.  A consult to cardiology has been placed for recurrent nonsustained V. tach and CHF. History of sleep apnea patient uses CPAP here.  He does have a CPAP at home . Hypertension stable on current medications.     DVT prophylaxis:  lovenox Code Status:full Family Communication:none Disposition Plan:tbd Consultants: cards  Procedures: none Antimicrobials: none Subjective:no complaints   Objective:sitting up in chair in nad Vitals:   10/24/17 2050 10/24/17 2311 10/25/17 0359 10/25/17 0820  BP: 137/77 120/76 134/84 126/83  Pulse: 100  86 96  Resp: 18  20   Temp: 98.1 F (36.7 C)  98.6 F (37 C)   TempSrc: Oral  Oral   SpO2: 96%  96%   Weight:   (!) 175.3 kg (386 lb 6.4 oz)   Height:        Intake/Output Summary (Last 24 hours) at 10/25/2017 1128 Last data filed at 10/25/2017 0915 Gross per 24 hour  Intake 1330 ml  Output 1100 ml  Net 230 ml   Filed Weights   10/23/17 0447 10/24/17 0512 10/25/17 0359  Weight: (!) 178.4 kg (393 lb 4.8 oz) (!) 176.9 kg (390 lb 1.6 oz) (!) 175.3 kg (386 lb 6.4 oz)    Examination:  General exam: Appears calm and comfortable  Respiratory system: Clear to auscultation. Respiratory effort normal. Cardiovascular system: S1 & S2 heard, RRR. No JVD, murmurs, rubs, gallops or clicks. No pedal edema. Gastrointestinal system: Abdomen is distended,  soft and nontender. No organomegaly or masses felt. Normal bowel sounds heard. Central nervous system: Alert and oriented. No focal neurological deficits. Extremities: 2 plus edema Skin: No rashes, lesions or ulcers Psychiatry: Judgement and insight appear normal. Mood & affect appropriate.     Data Reviewed: I have personally reviewed following labs and imaging studies  CBC: Recent Labs  Lab 10/21/17 1807 10/22/17 0356  WBC 8.4 7.3  HGB 16.1 16.1  HCT 51.0 52.2*  MCV 82.4 82.9  PLT 251  262   Basic Metabolic Panel: Recent Labs  Lab 10/21/17 1807 10/22/17 0356 10/23/17 0454 10/24/17 0608 10/25/17 0509  NA 139  --  141 140 138  K 4.1  --  4.8 4.7 3.8  CL 100*  --  93* 91* 89*  CO2 30  --  41* 40* 38*  GLUCOSE 122*  --  122* 106* 95  BUN 17  --  21* 18 15  CREATININE 1.11 1.20 1.55* 1.33* 1.14  CALCIUM 8.4*  --  8.6* 8.7* 9.2  MG  --  1.9 2.0  --   --    GFR: Estimated Creatinine Clearance: 149.6 mL/min (by C-G formula based on SCr of 1.14 mg/dL). Liver Function Tests: No results for input(s): AST, ALT, ALKPHOS, BILITOT, PROT, ALBUMIN in the last 168 hours. No results for input(s): LIPASE, AMYLASE in the last 168 hours. No results for input(s): AMMONIA in the last 168 hours. Coagulation Profile: No results for input(s): INR, PROTIME in the last 168 hours. Cardiac Enzymes: Recent Labs  Lab 10/22/17 0357 10/22/17 0935 10/22/17 1440  TROPONINI 0.06* 0.05* 0.05*   BNP (last 3 results) No results for input(s): PROBNP in the last 8760 hours. HbA1C: No results for input(s): HGBA1C in the last 72 hours. CBG: Recent Labs  Lab 10/23/17 0746 10/23/17 1110 10/23/17 1632 10/23/17 2118  GLUCAP 95 91 155* 133*   Lipid Profile: No results for input(s): CHOL, HDL, LDLCALC, TRIG, CHOLHDL, LDLDIRECT in the last 72 hours. Thyroid Function Tests: No results for input(s): TSH, T4TOTAL, FREET4, T3FREE, THYROIDAB in the last 72 hours. Anemia Panel: No results for input(s): VITAMINB12, FOLATE, FERRITIN, TIBC, IRON, RETICCTPCT in the last 72 hours. Sepsis Labs: No results for input(s): PROCALCITON, LATICACIDVEN in the last 168 hours.  No results found for this or any previous visit (from the past 240 hour(s)).       Radiology Studies: No results found.      Scheduled Meds: . fluticasone  1 spray Each Nare Daily  . furosemide  80 mg Intravenous Q12H  . isosorbide-hydrALAZINE  1 tablet Oral TID  . lisinopril  20 mg Oral BID  . loratadine  10 mg Oral  Daily  . metolazone  2.5 mg Oral Daily  . potassium chloride  20 mEq Oral BID  . spironolactone  50 mg Oral Daily   Continuous Infusions: . magnesium sulfate 1 - 4 g bolus IVPB 2 g (10/25/17 1102)     LOS: 3 days      Alwyn Ren, MD [facility]  If 7PM-7AM, please contact night-coverage [email or web address] Password [email or web address] 10/25/2017, 11:28 AM

## 2017-10-25 NOTE — Consult Note (Signed)
Cardiology Consultation:   Patient ID: Roger Ward; [id number]; 1981-10-19   Admit date: 10/21/2017 Date of Consult: 10/25/2017  Primary Care Provider: Veryl Speak, FNP Primary Cardiologist: Dr. Royann Shivers  Sleep Clinic: Dr. Mayford Knife    Patient Profile:   Roger Ward is a 36 y.o. male with a h/o hypertensive heart disease/ dilated cardiomyopathy w/ biventricular failure, malignant HTN, morbid obesity and OSA, admitted for acute on chronic combined systolic and diastolic CHF, in the setting of medication noncompliance and non adherance to low sodium diet. Cardiology consulted for assistance with acute CHF as well as for NSVT, at the request of Dr. Ashley Royalty, Internal Medicine.   History of Present Illness:   36 y.o. male with super morbid obesity, malignant hypertension, OSA on CPAP therapy, biventricular failure, dilated cardiomyopathy, mild to moderate LV dysfunction with baseline EF 40%, moderate pulmonary artery hypertension due to cor pulmonale. He is followed in clinic by Dr. Royann Shivers. Dr. Mayford Knife follows his OSA.   His dilated CM is suspected to be due to poorly controlled HTN. He has not undergone ischemic evaluation. His most recent echo shows EF at 35-40%. He has been treated with lisinopril, aldactone and Bidil. He is not on beta blockers due to bradycardia. His home diuretic regimen includes Lasix and metolazone, however due to financial restraints, he has had difficulties affording his meds, which has impacted compliance. PT has been w/o several of his medications. He also admits to poor diet, high in sodium. he eats out a lot and eats a lot of processed foods. Subsequently, he has developed fluid retention and weight gain. He developed progressive dyspnea, prompting him to go to the ED. Pt reports his normal dry weight is 340 lb. Pt presented 10/21/17 with weight close to 400 lb. Admit BNP was 231. Troponin mildly abnormal but low level and trending downward from  0.09>>0.06>>0.05. EKG showed sinus tach w/ RAE and RBBB. Pt was admitted by IM and started on IV diuretics, 80 mg. He has diuresed 5L since admit. Weight is down from 391 lb to 386 lb. BP is controlled in the 120s-130s systolic. He had mild renal insufficieny, which has improved with diuresis. SCr improved from 1.55>>1.33>>1.14. K is WNL. Pt also noted to have a 12 beat run of NSVT on telemetry but was asymptomatic. Mg level pending.  Overall, he notes that he is feeling better. He denies resting dyspnea. His exertional dyspnea has improved. No orthopnea. No CP.     Past Medical History:  Diagnosis Date  . 2nd degree AV block    a. noted during 02/2015 admission - second degree AV AV block (not further defined in notes). Not on BB due to this.  . Asthma    Childhood  . Bradycardia    a. noted during 02/2015 admission - second degree AV AV block (not further defined in notes). Not on BB due to this.  . Chronic combined systolic and diastolic CHF (congestive heart failure) (HCC)    a. echo 03/06/15 showing mild LVH, EF 40%, high ventricular filling pressures, mod MR, mildly dilated RV/mildly reduced RV function, mildly dilated RA, PASP .  . Diabetes mellitus, type 2 (HCC)   . Essential hypertension   . Family history of adverse reaction to anesthesia    mother and aunt  both put to sleep for surgery and "stopped breathing"  . Morbid obesity (HCC)   . NSVT (nonsustained ventricular tachycardia) (HCC)    a. noted during 02/2015 admission.  . OSA (obstructive  sleep apnea)    a. noncompliance with CPAP - wakes up with it off.    Past Surgical History:  Procedure Laterality Date  . NO PAST SURGERIES       Home Medications:  Prior to Admission medications   Medication Sig Start Date End Date Taking? Authorizing Provider  amLODipine (NORVASC) 10 MG tablet Take 1 tablet (10 mg total) by mouth daily. 12/28/16 10/23/23 Yes Azalee CourseMeng, Hao, PA  cetirizine (ZYRTEC) 10 MG tablet Take 1 tablet (10 mg  total) by mouth daily. 08/07/17  Yes Mikell, Antionette PolesAsiyah Zahra, MD  fluticasone (FLONASE) 50 MCG/ACT nasal spray Place 1 spray into both nostrils daily. 1 spray in each nostril every day 08/07/17  Yes Mikell, Antionette PolesAsiyah Zahra, MD  furosemide (LASIX) 80 MG tablet TAKE 1 TABLET (80 MG TOTAL) BY MOUTH 2 (TWO) TIMES DAILY. 12/07/16  Yes Meng, Wynema BirchHao, PA  isosorbide-hydrALAZINE (BIDIL) 20-37.5 MG tablet Take 1 tablet by mouth 3 (three) times daily. 12/07/16  Yes Azalee CourseMeng, Hao, PA  lisinopril (PRINIVIL,ZESTRIL) 20 MG tablet Take 1 tablet (20 mg total) by mouth 2 (two) times daily. 12/07/16  Yes Azalee CourseMeng, Hao, PA  metolazone (ZAROXOLYN) 2.5 MG tablet Take 1 tab (2.5) tab by mouth Tuesday, Thursday and Saturday 07/02/17  Yes Croitoru, Mihai, MD  spironolactone (ALDACTONE) 50 MG tablet Take 1 tablet (50 mg total) by mouth daily. 07/02/17 10/23/23 Yes Croitoru, Mihai, MD  Chlorpheniramine-DM 4-30 MG TABS Take 2 tablets a need every 6 hours a needed for symptomatic relief Patient not taking: Reported on 10/22/2017 08/07/17   Berton BonMikell, Asiyah Zahra, MD  sulfacetamide (BLEPH-10) 10 % ophthalmic solution Place 1-2 drops into the left eye every 2 (two) hours while awake. Patient not taking: Reported on 10/22/2017 07/12/16   Deatra Canterxford, William J, FNP    Inpatient Medications: Scheduled Meds: . fluticasone  1 spray Each Nare Daily  . furosemide  80 mg Intravenous Q12H  . isosorbide-hydrALAZINE  1 tablet Oral TID  . lisinopril  20 mg Oral BID  . loratadine  10 mg Oral Daily  . metolazone  2.5 mg Oral Daily  . potassium chloride  20 mEq Oral BID  . spironolactone  50 mg Oral Daily   Continuous Infusions:  PRN Meds: acetaminophen **OR** acetaminophen  Allergies:    Allergies  Allergen Reactions  . Penicillins Swelling    Social History:   Social History   Socioeconomic History  . Marital status: Married    Spouse name: Not on file  . Number of children: 0  . Years of education: 2212  . Highest education level: Not on file    Social Needs  . Financial resource strain: Not on file  . Food insecurity - worry: Not on file  . Food insecurity - inability: Not on file  . Transportation needs - medical: Not on file  . Transportation needs - non-medical: Not on file  Occupational History  . Occupation: Merchandiser, retailupervisor at [employer]  Tobacco Use  . Smoking status: Current Every Day Smoker    Packs/day: 0.50    Years: 19.00    Pack years: 9.50    Types: Cigarettes    Start date: 01/05/1996    Last attempt to quit: 03/04/2015    Years since quitting: 2.6  . Smokeless tobacco: Never Used  . Tobacco comment: Already decreasaed smoking  Substance and Sexual Activity  . Alcohol use: No    Alcohol/week: 0.6 - 1.2 oz    Types: 1 - 2 Standard drinks or equivalent per week  Comment: Occasional  . Drug use: No  . Sexual activity: Yes    Birth control/protection: Condom  Other Topics Concern  . Not on file  Social History Narrative   Born and raised in [place]. Fun: Bowling, movies, we go   Denies religious beliefs that would effect health care.     Family History:    Family History  Problem Relation Age of Onset  . Hypertension Mother   . Hyperlipidemia Mother   . Heart failure Mother   . Multiple sclerosis Mother   . Breast cancer Maternal Grandmother   . Diabetes Maternal Grandmother   . Colon cancer Maternal Uncle      ROS:  Please see the history of present illness.  ROS  All other ROS reviewed and negative.     Physical Exam/Data:   Vitals:   10/24/17 2311 10/25/17 0359 10/25/17 0820 10/25/17 1238  BP: 120/76 134/84 126/83 127/74  Pulse:  86 96 93  Resp:  20  20  Temp:  98.6 F (37 C)  (!) 97.5 F (36.4 C)  TempSrc:  Oral  Oral  SpO2:  96%  91%  Weight:  (!) 386 lb 6.4 oz (175.3 kg)    Height:        Intake/Output Summary (Last 24 hours) at 10/25/2017 1413 Last data filed at 10/25/2017 0915 Gross per 24 hour  Intake 1010 ml  Output 800 ml  Net 210 ml   Filed Weights    10/23/17 0447 10/24/17 0512 10/25/17 0359  Weight: (!) 393 lb 4.8 oz (178.4 kg) (!) 390 lb 1.6 oz (176.9 kg) (!) 386 lb 6.4 oz (175.3 kg)   Body mass index is 50.98 kg/m.  General:  Well nourished, well developed, in no acute distress, morbidly obese  HEENT: normal Lymph: no adenopathy Neck: no JVD Endocrine:  No thryomegaly Vascular: No carotid bruits; FA pulses 2+ bilaterally without bruits  Cardiac:  normal S1, S2; RRR; no murmur  Lungs:  clear to auscultation bilaterally, no wheezing, rhonchi or rales  Abd: soft, nontender, no hepatomegaly , obese Ext: bilateral LEE Musculoskeletal:  No deformities, BUE and BLE strength normal and equal Skin: warm and dry  Neuro:  CNs 2-12 intact, no focal abnormalities noted Psych:  Normal affect   EKG:  The EKG was personally reviewed and demonstrates:  Sinus tach with RAE and RBBB Telemetry:  Telemetry was personally reviewed and demonstrates:  NSR currently, 12 beat run of NSVT earlier   Relevant CV Studies: 2D Echo 10/22/17 Study Conclusions  - Left ventricle: The cavity size was mildly dilated. Wall   thickness was normal. Systolic function was moderately reduced.   The estimated ejection fraction was in the range of 35% to 40%.   Diffuse hypokinesis. Doppler parameters are consistent with   abnormal left ventricular relaxation (grade 1 diastolic   dysfunction). - Ventricular septum: D-shaped interventricular septum suggestive   of RV pressure/volume overload. - Aortic valve: There was no stenosis. - Mitral valve: There was no significant regurgitation. - Right ventricle: Poorly visualized. The cavity size was   moderately dilated. Systolic function was moderately reduced. - Right atrium: Poorly visualized. - Tricuspid valve: Peak RV-RA gradient (S): 25 mm Hg. - Pulmonary arteries: PA peak pressure: 40 mm Hg (S). - Systemic veins: IVC measured 3 cm with < 50% respirophasic   variation, suggesting RA pressure 15  mmHg.  Impressions:  - Technically difficult study with poor acoustic windows. Mildly   dilated LV with EF 35-40%, diffuse  hypokinesis (LV was difficult   to evaluate even with Definity echo contrast). The RV was poorly   visualized. Probably moderately dilated with moderately decreased   systolic function. D-shaped interventricular septum suggestive of   RV pressure/volume overload. Mild pulmonary hypertension. Dilated   IVC suggestive of elevated RV filling pressure.  Laboratory Data:  Chemistry Recent Labs  Lab 10/23/17 0454 10/24/17 0608 10/25/17 0509  NA 141 140 138  K 4.8 4.7 3.8  CL 93* 91* 89*  CO2 41* 40* 38*  GLUCOSE 122* 106* 95  BUN 21* 18 15  CREATININE 1.55* 1.33* 1.14  CALCIUM 8.6* 8.7* 9.2  GFRNONAA 56* >60 >60  GFRAA >60 >60 >60  ANIONGAP 7 9 11     No results for input(s): PROT, ALBUMIN, AST, ALT, ALKPHOS, BILITOT in the last 168 hours. Hematology Recent Labs  Lab 10/21/17 1807 10/22/17 0356  WBC 8.4 7.3  RBC 6.19* 6.30*  HGB 16.1 16.1  HCT 51.0 52.2*  MCV 82.4 82.9  MCH 26.0 25.6*  MCHC 31.6 30.8  RDW 18.4* 18.0*  PLT 251 262   Cardiac Enzymes Recent Labs  Lab 10/22/17 0357 10/22/17 0935 10/22/17 1440  TROPONINI 0.06* 0.05* 0.05*    Recent Labs  Lab 10/21/17 1820 10/22/17 0009  TROPIPOC 0.08 0.09*    BNP Recent Labs  Lab 10/21/17 1807  BNP 231.7*    DDimer No results for input(s): DDIMER in the last 168 hours.  Radiology/Studies:  Dg Chest 2 View  Result Date: 10/21/2017 CLINICAL DATA:  CHF 2 weeks. EXAM: CHEST  2 VIEW COMPARISON:  03/05/2015 FINDINGS: Lungs are hypoinflated without lobar consolidation or effusion. There is mild prominence of the perihilar markings unchanged to slightly improved. Moderate stable cardiomegaly. Remainder of the exam is unchanged. IMPRESSION: Moderate stable cardiomegaly with mild vascular congestion which is unchanged to slightly improved. Electronically Signed   By: Elberta Fortis M.D.   On:  10/21/2017 19:02    Assessment and Plan:   Roger Ward is a 36 y.o. male with a h/o hypertensive heart disease/ dilated cardiomyopathy w/ biventricular failure, malignant HTN, morbid obesity and OSA, admitted for acute on chronic combined systolic and diastolic CHF, in the setting of medication noncompliance and non adherance to low sodium diet. Cardiology consulted for assistance with acute CHF as well as for NSVT, at the request of Dr. Ashley Royalty, Internal Medicine.    1. Acute on Chronic Combined Systolic and Diastolic CHF: EF is 35-40%. Pt admits to medication compliance issues, including his diuretics, due to financial issues as well as eating a diet high in sodium. Subsequently, he developed nearly 50 lb weight gain and exertional dyspnea. He is responding to IV Lasix and feeling better. Dyspnea improved. Difficult to assess volume status due to super morbid obesity, however pt notes his usual baseline weight is ~340 lb. He was 391 lb on admit and 386 lb today. Renal function is improving with diuresis and WNL. Continue to diurese with lasix. Attempt to diurese back down to dry weight. Continue ACE-I, spironolactone and Bidil. He will need CM assistance to help with medication cost to help improve compliance. He also needs education regarding low sodium diet.   2. NSVT: in the setting of cardiomyopathy w/ EF of 35-40%. K is WNL. Mg lab pending. Replete if needed. Try to keep K ~4.0 and Mg > 2.  It has been outlined in previous cardiology notes that beta blockers have been avoided given significant bradycardia in the past including secondary degree AV  block, type II. Suspect his bradycardia is 2/2 OSA. We will try low dose BB to see how he tolerates. Will add Coreg. We will start low dose 3.125 mg BID. Monitor on telemetry.   3. HTN: controlled on current regimen.   4. OSA: on CPAP therapy. Followed by Dr. Mayford Knife    For questions or updates, please contact [facility] Please consult  [email or web address] for contact info under Cardiology/STEMI.   Signed, Robbie Lis, PA-C  10/25/2017 2:13 PM   Personally seen and examined. Agree with above.  Less SOB, but still a way to go.  RRR, obese, difficult to hear breath sounds.  Chronic lower extremity edema chronic skin changes noted.  Lab work personally reviewed, EKG personally reviewed-right bundle branch block noted.  EF 35-40%.  Acute on chronic systolic heart failure -Continue with aggressive diuresis.  Currently -5 L out.  We may be able to diurese him 50 pounds. - We will add low-dose carvedilol 3.125 mg twice a day.  He has had bradycardia in the past likely resulting from his obstructive sleep apnea.  Watch for any significant pauses. -Continue with other medications as described above.  Obstructive sleep apnea -Continue with CPAP  Essential hypertension - Continue with current regimen.  Adding carvedilol.  Nonsustained ventricular tachycardia -Keep magnesium greater than 2, potassium greater than 4.  Comment to see this finding with ejection fraction 35-40%.  Asymptomatic.  We are trying to place him on low-dose beta-blocker.  Donato Schultz, MD

## 2017-10-26 LAB — BASIC METABOLIC PANEL
Anion gap: 9 (ref 5–15)
BUN: 15 mg/dL (ref 6–20)
CHLORIDE: 92 mmol/L — AB (ref 101–111)
CO2: 38 mmol/L — ABNORMAL HIGH (ref 22–32)
Calcium: 9.4 mg/dL (ref 8.9–10.3)
Creatinine, Ser: 1.15 mg/dL (ref 0.61–1.24)
GFR calc non Af Amer: >60 mL/min (ref >60)
Glucose, Bld: 93 mg/dL (ref 65–99)
POTASSIUM: 4.1 mmol/L (ref 3.5–5.1)
SODIUM: 139 mmol/L (ref 135–145)

## 2017-10-26 LAB — MAGNESIUM: MAGNESIUM: 2.2 mg/dL (ref 1.7–2.4)

## 2017-10-26 MED ORDER — FUROSEMIDE 10 MG/ML IJ SOLN
80.0000 mg | Freq: Three times a day (TID) | INTRAMUSCULAR | Status: DC
  Administered 2017-10-26 – 2017-10-29 (×11): 80 mg via INTRAVENOUS
  Filled 2017-10-26 (×11): qty 8

## 2017-10-26 NOTE — Progress Notes (Signed)
Pt stable throughout the shift, no any complain of chest pain and SOB, diuresing well, pt was sitting in a recliner pretty much of the time, will continue to monitor the patient

## 2017-10-26 NOTE — Progress Notes (Signed)
PROGRESS NOTE    Roger Ward  KDX:[id number] DOB: [birth year] DOA: 10/21/2017 PCP: Veryl Speak, FNP  Brief Narrative: 36 y.o.malewithhistory of chronic systolic and diastolic heart failure, morbid obesity, sleep apnea presents to the ER because of worsening shortness of breath. Patient states over the last few days patient has been getting progressively short of breath. Patient states he has not taken some of his medications including diuretics due to problem with finances. He has gained weight. Denies any chest pain productive cough fever chills.  ED Course:In the ER patient is found to be hypoxic with chest x-ray showing congestion. BNP was elevated. Troponin was mildly elevated. EKG shows sinus rhythm with RBBB. Patient has gained at least 25 pounds from recent past. Patient was given Lasix 80 mg IV admitted for CHF management. He was started on coreg for NSVT.he has a history of bradycardia with bb.he uses cpap every nite.   Assessment & Plan:   Acute on chronic CHF systolic-10 fraction 35% by echo done this month.  Continue IV Lasix.  Noted cardiology notes increased Lasix to 80 mg 3 times a day.  Metolazone 2.5 mg was added yesterday.  Continue Coreg monitor for bradycardia heart blocks.  Continue bidil,ace. Hypertension patient's blood pressure has been in the low to low normal range with all the cardiac medications that he is on will monitor. Sleep apnea on CPAP every night.   DVT prophylaxis:   Code Status Family Communication: Disposition Plan:  Consultants:   Procedures:  Antimicrobials:  Subjective:  Objective: Vitals:   10/25/17 2010 10/25/17 2319 10/26/17 0619 10/26/17 1212  BP: 113/62 108/65 118/64 (!) 104/50  Pulse: 87 89 90 86  Resp: 18  18 18   Temp: 98.6 F (37 C)  98.1 F (36.7 C) 99.1 F (37.3 C)  TempSrc: Oral  Oral Oral  SpO2: 96%  93% 98%  Weight:   (!) 169.7 kg (374 lb 1.6 oz)   Height:        Intake/Output Summary (Last 24  hours) at 10/26/2017 1242 Last data filed at 10/26/2017 9767 Gross per 24 hour  Intake 290 ml  Output 800 ml  Net -510 ml   Filed Weights   10/24/17 0512 10/25/17 0359 10/26/17 0619  Weight: (!) 176.9 kg (390 lb 1.6 oz) (!) 175.3 kg (386 lb 6.4 oz) (!) 169.7 kg (374 lb 1.6 oz)    Examination:  General exam: Appears calm and comfortable  Respiratory system: Clear to auscultation. Respiratory effort normal. Cardiovascular system: S1 & S2 heard, RRR. No JVD, murmurs, rubs, gallops or clicks.  Gastrointestinal system: Abdomen is nondistended, soft and nontender. No organomegaly or masses felt. Normal bowel sounds heard. Central nervous system: Alert and oriented. No focal neurological deficits. Extremities:one plus edema Skin: No rashes, lesions or ulcers Psychiatry: Judgement and insight appear normal. Mood & affect appropriate.     Data Reviewed: I have personally reviewed following labs and imaging studies  CBC: Recent Labs  Lab 10/21/17 1807 10/22/17 0356  WBC 8.4 7.3  HGB 16.1 16.1  HCT 51.0 52.2*  MCV 82.4 82.9  PLT 251 262   Basic Metabolic Panel: Recent Labs  Lab 10/21/17 1807 10/22/17 0356 10/23/17 0454 10/24/17 0608 10/25/17 0509 10/26/17 0606  NA 139  --  141 140 138 139  K 4.1  --  4.8 4.7 3.8 4.1  CL 100*  --  93* 91* 89* 92*  CO2 30  --  41* 40* 38* 38*  GLUCOSE 122*  --  122* 106* 95 93  BUN 17  --  21* 18 15 15   CREATININE 1.11 1.20 1.55* 1.33* 1.14 1.15  CALCIUM 8.4*  --  8.6* 8.7* 9.2 9.4  MG  --  1.9 2.0  --   --  2.2   GFR: Estimated Creatinine Clearance: 145.4 mL/min (by C-G formula based on SCr of 1.15 mg/dL). Liver Function Tests: No results for input(s): AST, ALT, ALKPHOS, BILITOT, PROT, ALBUMIN in the last 168 hours. No results for input(s): LIPASE, AMYLASE in the last 168 hours. No results for input(s): AMMONIA in the last 168 hours. Coagulation Profile: No results for input(s): INR, PROTIME in the last 168 hours. Cardiac  Enzymes: Recent Labs  Lab 10/22/17 0357 10/22/17 0935 10/22/17 1440  TROPONINI 0.06* 0.05* 0.05*   BNP (last 3 results) No results for input(s): PROBNP in the last 8760 hours. HbA1C: No results for input(s): HGBA1C in the last 72 hours. CBG: Recent Labs  Lab 10/23/17 0746 10/23/17 1110 10/23/17 1632 10/23/17 2118  GLUCAP 95 91 155* 133*   Lipid Profile: No results for input(s): CHOL, HDL, LDLCALC, TRIG, CHOLHDL, LDLDIRECT in the last 72 hours. Thyroid Function Tests: No results for input(s): TSH, T4TOTAL, FREET4, T3FREE, THYROIDAB in the last 72 hours. Anemia Panel: No results for input(s): VITAMINB12, FOLATE, FERRITIN, TIBC, IRON, RETICCTPCT in the last 72 hours. Sepsis Labs: No results for input(s): PROCALCITON, LATICACIDVEN in the last 168 hours.  No results found for this or any previous visit (from the past 240 hour(s)).       Radiology Studies: No results found.      Scheduled Meds: . carvedilol  3.125 mg Oral BID WC  . fluticasone  1 spray Each Nare Daily  . furosemide  80 mg Intravenous TID  . isosorbide-hydrALAZINE  1 tablet Oral TID  . lisinopril  20 mg Oral BID  . loratadine  10 mg Oral Daily  . metolazone  2.5 mg Oral Daily  . potassium chloride  20 mEq Oral BID  . spironolactone  50 mg Oral Daily   Continuous Infusions:   LOS: 4 days      Alwyn RenElizabeth G Mathews, MD [facility]  If 7PM-7AM, please contact night-coverage [email or web address] Password [email or web address] 10/26/2017, 12:42 PM

## 2017-10-26 NOTE — Progress Notes (Signed)
Progress Note  Patient Name: Lubertha BasqueJerome L Herd Date of Encounter: 10/26/2017   Subjective   SOB improving.  Inpatient Medications    Scheduled Meds: . carvedilol  3.125 mg Oral BID WC  . fluticasone  1 spray Each Nare Daily  . furosemide  80 mg Intravenous Q12H  . isosorbide-hydrALAZINE  1 tablet Oral TID  . lisinopril  20 mg Oral BID  . loratadine  10 mg Oral Daily  . metolazone  2.5 mg Oral Daily  . potassium chloride  20 mEq Oral BID  . spironolactone  50 mg Oral Daily   Continuous Infusions:  PRN Meds: acetaminophen **OR** acetaminophen   Vital Signs    Vitals:   10/25/17 1707 10/25/17 2010 10/25/17 2319 10/26/17 0619  BP: 127/78 113/62 108/65 118/64  Pulse: 84 87 89 90  Resp:  18  18  Temp:  98.6 F (37 C)  98.1 F (36.7 C)  TempSrc:  Oral  Oral  SpO2:  96%  93%  Weight:    (!) 374 lb 1.6 oz (169.7 kg)  Height:        Intake/Output Summary (Last 24 hours) at 10/26/2017 1057 Last data filed at 10/26/2017 0620 Gross per 24 hour  Intake 530 ml  Output 800 ml  Net -270 ml   Filed Weights   10/24/17 0512 10/25/17 0359 10/26/17 0619  Weight: (!) 390 lb 1.6 oz (176.9 kg) (!) 386 lb 6.4 oz (175.3 kg) (!) 374 lb 1.6 oz (169.7 kg)    Telemetry    SR  ECG   na  Physical Exam   GEN: No acute distress.   Neck: elevated JVD Cardiac: RRR, no murmurs, rubs, or gallops.  Respiratory: Clear to auscultation bilaterally. GI: Soft, nontender, non-distended  MS: 1+ bilateral LE edema; No deformity. Neuro:  Nonfocal  Psych: Normal affect   Labs    Chemistry Recent Labs  Lab 10/24/17 0608 10/25/17 0509 10/26/17 0606  NA 140 138 139  K 4.7 3.8 4.1  CL 91* 89* 92*  CO2 40* 38* 38*  GLUCOSE 106* 95 93  BUN 18 15 15   CREATININE 1.33* 1.14 1.15  CALCIUM 8.7* 9.2 9.4  GFRNONAA >60 >60 >60  GFRAA >60 >60 >60  ANIONGAP 9 11 9      Hematology Recent Labs  Lab 10/21/17 1807 10/22/17 0356  WBC 8.4 7.3  RBC 6.19* 6.30*  HGB 16.1 16.1  HCT 51.0  52.2*  MCV 82.4 82.9  MCH 26.0 25.6*  MCHC 31.6 30.8  RDW 18.4* 18.0*  PLT 251 262    Cardiac Enzymes Recent Labs  Lab 10/22/17 0357 10/22/17 0935 10/22/17 1440  TROPONINI 0.06* 0.05* 0.05*    Recent Labs  Lab 10/21/17 1820 10/22/17 0009  TROPIPOC 0.08 0.09*     BNP Recent Labs  Lab 10/21/17 1807  BNP 231.7*     DDimer No results for input(s): DDIMER in the last 168 hours.   Radiology    No results found.  Cardiac Studies     Patient Profile     36 y.o. male admitted with acute on chronic systolic HF  Assessment & Plan    1. Acute on chronic systolic HF - 10/2017 echo LVEF 35-40%, grade I diastolic dysfunction, D-shaped septum suggesting RV pressure and volume overload, mod RV dysfunction, PASP 40, dilated IVC. LVEF in 2016 40% - poor medication compliance, will need med assistance at discharge - +450 per charting yesterday, negative 5.3 liters since admission. Cr has been trending  down with diuresis. Minimal diuresis yesterday, increase lasix to 80mg  IV tid. Started on metolazone 2.5mg  daily yesterday - reported dry weight 340 lbs.  - medical therapy with coreg, bidl, lisinopril, aldactone  2. NSVT - started on beta blocker, continue to monitior. Keep K at 4 and Mg at 2 - transient Wenchebach block last night, no other significant bradycardia. Continue to monitor on beta blocker, apparently has had low rates before on beta blockers  3. OSA - continue cpap  For questions or updates, please contact [facility] Please consult [email or web address] for contact info under Cardiology/STEMI.      Joanie Coddington, MD  10/26/2017, 10:57 AM

## 2017-10-26 NOTE — Progress Notes (Signed)
RT set up pt on CPAP with a pressure of 10 and a Medium nasal mask. Pt tolerating well. RT to cont to monitor.

## 2017-10-27 LAB — BASIC METABOLIC PANEL
ANION GAP: 12 (ref 5–15)
BUN: 21 mg/dL — ABNORMAL HIGH (ref 6–20)
CO2: 35 mmol/L — ABNORMAL HIGH (ref 22–32)
Calcium: 8.9 mg/dL (ref 8.9–10.3)
Chloride: 89 mmol/L — ABNORMAL LOW (ref 101–111)
Creatinine, Ser: 1.15 mg/dL (ref 0.61–1.24)
GLUCOSE: 94 mg/dL (ref 65–99)
POTASSIUM: 5.5 mmol/L — AB (ref 3.5–5.1)
SODIUM: 136 mmol/L (ref 135–145)

## 2017-10-27 LAB — MAGNESIUM: MAGNESIUM: 2.3 mg/dL (ref 1.7–2.4)

## 2017-10-27 LAB — POTASSIUM: POTASSIUM: 3.9 mmol/L (ref 3.5–5.1)

## 2017-10-27 NOTE — Progress Notes (Signed)
Progress Note  Patient Name: Roger Ward Date of Encounter: 10/27/2017   Subjective   No complaints  Inpatient Medications    Scheduled Meds: . carvedilol  3.125 mg Oral BID WC  . fluticasone  1 spray Each Nare Daily  . furosemide  80 mg Intravenous TID  . isosorbide-hydrALAZINE  1 tablet Oral TID  . lisinopril  20 mg Oral BID  . loratadine  10 mg Oral Daily  . metolazone  2.5 mg Oral Daily  . potassium chloride  20 mEq Oral BID  . spironolactone  50 mg Oral Daily   Continuous Infusions:  PRN Meds: acetaminophen **OR** acetaminophen   Vital Signs    Vitals:   10/26/17 0619 10/26/17 1212 10/26/17 2059 10/27/17 0630  BP: 118/64 (!) 104/50 130/68 116/80  Pulse: 90 86 94 75  Resp: 18 18 18 18   Temp: 98.1 F (36.7 C) 99.1 F (37.3 C) 98.3 F (36.8 C) 98.3 F (36.8 C)  TempSrc: Oral Oral Oral Oral  SpO2: 93% 98% 92% 98%  Weight: (!) 374 lb 1.6 oz (169.7 kg)   (!) 367 lb 6.4 oz (166.7 kg)  Height:        Intake/Output Summary (Last 24 hours) at 10/27/2017 0945 Last data filed at 10/27/2017 0645 Gross per 24 hour  Intake 630 ml  Output 2100 ml  Net -1470 ml   Filed Weights   10/25/17 0359 10/26/17 0619 10/27/17 0630  Weight: (!) 386 lb 6.4 oz (175.3 kg) (!) 374 lb 1.6 oz (169.7 kg) (!) 367 lb 6.4 oz (166.7 kg)    Telemetry    SR - Personally Reviewed  ECG     Physical Exam   GEN: No acute distress.   Neck: elevated JVD Cardiac: RRR, no murmurs, rubs, or gallops.  Respiratory: Clear to auscultation bilaterally. GI: Soft, nontender, non-distended  MS: trace bilateral edema; No deformity. Neuro:  Nonfocal  Psych: Normal affect   Labs    Chemistry Recent Labs  Lab 10/25/17 0509 10/26/17 0606 10/27/17 0359  NA 138 139 136  K 3.8 4.1 5.5*  CL 89* 92* 89*  CO2 38* 38* 35*  GLUCOSE 95 93 94  BUN 15 15 21*  CREATININE 1.14 1.15 1.15  CALCIUM 9.2 9.4 8.9  GFRNONAA >60 >60 >60  GFRAA >60 >60 >60  ANIONGAP 11 9 12       Hematology Recent Labs  Lab 10/21/17 1807 10/22/17 0356  WBC 8.4 7.3  RBC 6.19* 6.30*  HGB 16.1 16.1  HCT 51.0 52.2*  MCV 82.4 82.9  MCH 26.0 25.6*  MCHC 31.6 30.8  RDW 18.4* 18.0*  PLT 251 262    Cardiac Enzymes Recent Labs  Lab 10/22/17 0357 10/22/17 0935 10/22/17 1440  TROPONINI 0.06* 0.05* 0.05*    Recent Labs  Lab 10/21/17 1820 10/22/17 0009  TROPIPOC 0.08 0.09*     BNP Recent Labs  Lab 10/21/17 1807  BNP 231.7*     DDimer No results for input(s): DDIMER in the last 168 hours.   Radiology    No results found.  Cardiac Studies     Patient Profile     36 y.o. male admitted with acute on chronic systolic HF    Assessment & Plan   1. Acute on chronic systolic HF - 10/2017 echo LVEF 35-40%, grade I diastolic dysfunction, D-shaped septum suggesting RV pressure and volume overload, mod RV dysfunction, PASP 40, dilated IVC. LVEF in 2016 40% - poor medication compliance, will need med assistance  at discharge - neative 1.6 L per charting yesterday, negative 7.1 liters since admission. Yesterday we increased lasix to 80mg  IV tid, he started metolazone 2.5mg  daily a few days ago. Renal function overall is stable.  - reported dry weight 340 lbs.  - medical therapy with coreg, bidl, lisinopril, aldactone  - continue IV diuresis today  2. NSVT - started on beta blocker, continue to monitior. Keep K at 4 and Mg at 2 - transient Wenchebach block at times, no other significant bradycardia. Continue to monitor on beta blocker, apparently has had low rates before on beta blockers  3. OSA - continue cpap  4. Hyperkalemia - repeat K lab ordered, suspect falsely elevated   For questions or updates, please contact [facility] Please consult [email or web address] for contact info under Cardiology/STEMI.      Joanie CoddingtonSigned, Ahaan Zobrist, MD  10/27/2017, 9:45 AM

## 2017-10-27 NOTE — Progress Notes (Signed)
PROGRESS NOTE    Roger Ward  HXT:[id number] DOB: 10-13-1981 DOA: 10/21/2017 PCP: Veryl Speak, FNP   [phone] y.o.malewithhistory of chronic systolic and diastolic heart failure, morbid obesity, sleep apnea presents to the ER because of worsening shortness of breath. Patient states over the last few days patient has been getting progressively short of breath. Patient states he has not taken some of his medications including diuretics due to problem with finances. He has gained weight. Denies any chest pain productive cough fever chills.  ED Course:In the ER patient is found to be hypoxic with chest x-ray showing congestion. BNP was elevated. Troponin was mildly elevated. EKG shows sinus rhythm with RBBB. Patient has gained at least 25 pounds from recent past. Patient was given Lasix 80 mg IV admitted for CHF management. He was started on coreg for NSVT.he has a history of bradycardia with bb.he uses cpap every nite.  Assessment & Plan:   Active Problems:   Acute combined systolic and diastolic congestive heart failure (HCC)   OSA (obstructive sleep apnea)   Diabetes mellitus, type 2 (HCC)   Morbid obesity (HCC)   Demand ischemia (HCC)   Acute hypoxemic respiratory failure (HCC)   Acute on chronic CHF systolic ejection fraction 35%./NSVT-  Patient has been losing weight and urine output negative by 1.6 later.  He reported his dry weight is 340 pounds and today he is 367 pounds.  But he still continues to have significant lower extremity edema.  Patient started on Lasix 80 mg 3 times a day yesterday along with metolazone which was added few days ago.  He did not show much improvement with Lasix 80 mg twice a day.  She has been started on Coreg 3.125 mg twice a day for an SVT.  Aim to keep the mag and potassium above 2 and 4.  He has not had any NSVT after starting Coreg.  He was not on a beta-blocker at home due to bradycardia which will which is being monitored at this time.   Continue Coreg Lasix BiDil, lisinopril, Zaroxolyn, Aldactone 50 mg daily.  His potassium today is 5.5.  Noted that he is on Aldactone and ACE inhibitor.  And potassium replacement.  We will DC his potassium replacement and recheck levels tomorrow.  Meanwhile continue ACE inhibitors and Aldactone. Acute on chronic kidney disease-hyperkalemia /secondary to diuresis.  Creatinine has been stable at 1.15.  DVT prophylaxis: Lovenox  Code Status: Full code Family Communication wife by the bedside :Disposition Plan:  TBD Consultants:  Cardiology Procedures: None  Subjective: Watching TV no complaints  Objective: Resting in bed in no acute distress Vitals:   10/26/17 0619 10/26/17 1212 10/26/17 2059 10/27/17 0630  BP: 118/64 (!) 104/50 130/68 116/80  Pulse: 90 86 94 75  Resp: 18 18 18 18   Temp: 98.1 F (36.7 C) 99.1 F (37.3 C) 98.3 F (36.8 C) 98.3 F (36.8 C)  TempSrc: Oral Oral Oral Oral  SpO2: 93% 98% 92% 98%  Weight: (!) 169.7 kg (374 lb 1.6 oz)   (!) 166.7 kg (367 lb 6.4 oz)  Height:        Intake/Output Summary (Last 24 hours) at 10/27/2017 1010 Last data filed at 10/27/2017 0645 Gross per 24 hour  Intake 630 ml  Output 2100 ml  Net -1470 ml   Filed Weights   10/25/17 0359 10/26/17 0619 10/27/17 0630  Weight: (!) 175.3 kg (386 lb 6.4 oz) (!) 169.7 kg (374 lb 1.6 oz) (!) 166.7 kg (367  lb 6.4 oz)    Examination:  General exam: Appears calm and comfortable  Respiratory system: Clear to auscultation. Respiratory effort normal. Cardiovascular system: S1 & S2 heard, RRR. No JVD, murmurs, rubs, gallops or clicks. No pedal edema. Gastrointestinal system: Abdomen is nondistended, soft and nontender. No organomegaly or masses felt. Normal bowel sounds heard. Central nervous system: Alert and oriented. No focal neurological deficits. Extremities: Symmetric 5 x 5 power. Skin: No rashes, lesions or ulcers Psychiatry: Judgement and insight appear normal. Mood & affect appropriate.      Data Reviewed: I have personally reviewed following labs and imaging studies  CBC: Recent Labs  Lab 10/21/17 1807 10/22/17 0356  WBC 8.4 7.3  HGB 16.1 16.1  HCT 51.0 52.2*  MCV 82.4 82.9  PLT 251 262   Basic Metabolic Panel: Recent Labs  Lab 10/22/17 0356 10/23/17 0454 10/24/17 0608 10/25/17 0509 10/26/17 0606 10/27/17 0359  NA  --  141 140 138 139 136  K  --  4.8 4.7 3.8 4.1 5.5*  CL  --  93* 91* 89* 92* 89*  CO2  --  41* 40* 38* 38* 35*  GLUCOSE  --  122* 106* 95 93 94  BUN  --  21* 18 15 15  21*  CREATININE 1.20 1.55* 1.33* 1.14 1.15 1.15  CALCIUM  --  8.6* 8.7* 9.2 9.4 8.9  MG 1.9 2.0  --   --  2.2 2.3   GFR: Estimated Creatinine Clearance: 143.9 mL/min (by C-G formula based on SCr of 1.15 mg/dL). Liver Function Tests: No results for input(s): AST, ALT, ALKPHOS, BILITOT, PROT, ALBUMIN in the last 168 hours. No results for input(s): LIPASE, AMYLASE in the last 168 hours. No results for input(s): AMMONIA in the last 168 hours. Coagulation Profile: No results for input(s): INR, PROTIME in the last 168 hours. Cardiac Enzymes: Recent Labs  Lab 10/22/17 0357 10/22/17 0935 10/22/17 1440  TROPONINI 0.06* 0.05* 0.05*   BNP (last 3 results) No results for input(s): PROBNP in the last 8760 hours. HbA1C: No results for input(s): HGBA1C in the last 72 hours. CBG: Recent Labs  Lab 10/23/17 0746 10/23/17 1110 10/23/17 1632 10/23/17 2118  GLUCAP 95 91 155* 133*   Lipid Profile: No results for input(s): CHOL, HDL, LDLCALC, TRIG, CHOLHDL, LDLDIRECT in the last 72 hours. Thyroid Function Tests: No results for input(s): TSH, T4TOTAL, FREET4, T3FREE, THYROIDAB in the last 72 hours. Anemia Panel: No results for input(s): VITAMINB12, FOLATE, FERRITIN, TIBC, IRON, RETICCTPCT in the last 72 hours. Sepsis Labs: No results for input(s): PROCALCITON, LATICACIDVEN in the last 168 hours.  No results found for this or any previous visit (from the past 240 hour(s)).         Radiology Studies: No results found.      Scheduled Meds: . carvedilol  3.125 mg Oral BID WC  . fluticasone  1 spray Each Nare Daily  . furosemide  80 mg Intravenous TID  . isosorbide-hydrALAZINE  1 tablet Oral TID  . lisinopril  20 mg Oral BID  . loratadine  10 mg Oral Daily  . metolazone  2.5 mg Oral Daily  . potassium chloride  20 mEq Oral BID  . spironolactone  50 mg Oral Daily   Continuous Infusions:   LOS: 5 days        Alwyn RenElizabeth G Shamarie Call, MD [facility]   If 7PM-7AM, please contact night-coverage [email or web address] Password [email or web address] 10/27/2017, 10:10 AM

## 2017-10-28 LAB — BASIC METABOLIC PANEL
Anion gap: 9 (ref 5–15)
BUN: 29 mg/dL — AB (ref 6–20)
CALCIUM: 9 mg/dL (ref 8.9–10.3)
CO2: 35 mmol/L — ABNORMAL HIGH (ref 22–32)
CREATININE: 1.63 mg/dL — AB (ref 0.61–1.24)
Chloride: 93 mmol/L — ABNORMAL LOW (ref 101–111)
GFR calc Af Amer: >60 mL/min (ref >60)
GFR, EST NON AFRICAN AMERICAN: 53 mL/min — AB (ref >60)
GLUCOSE: 109 mg/dL — AB (ref 65–99)
Potassium: 4.2 mmol/L (ref 3.5–5.1)
SODIUM: 137 mmol/L (ref 135–145)

## 2017-10-28 MED ORDER — ENOXAPARIN SODIUM 30 MG/0.3ML ~~LOC~~ SOLN
30.0000 mg | SUBCUTANEOUS | Status: DC
  Administered 2017-10-28 – 2017-10-29 (×2): 30 mg via SUBCUTANEOUS
  Filled 2017-10-28 (×2): qty 0.3

## 2017-10-28 MED ORDER — LISINOPRIL 5 MG PO TABS
5.0000 mg | ORAL_TABLET | Freq: Two times a day (BID) | ORAL | Status: DC
  Filled 2017-10-28 (×2): qty 1

## 2017-10-28 NOTE — Progress Notes (Signed)
Progress Note  Patient Name: Roger Ward Date of Encounter: 10/28/2017   Primary Cardiologist Croitoru   Subjective   No complaints Legs still swollen not particularly dyspnic   Inpatient Medications    Scheduled Meds: . carvedilol  3.125 mg Oral BID WC  . fluticasone  1 spray Each Nare Daily  . furosemide  80 mg Intravenous TID  . isosorbide-hydrALAZINE  1 tablet Oral TID  . lisinopril  20 mg Oral BID  . loratadine  10 mg Oral Daily  . metolazone  2.5 mg Oral Daily  . spironolactone  50 mg Oral Daily   Continuous Infusions:  PRN Meds: acetaminophen **OR** acetaminophen   Vital Signs    Vitals:   10/27/17 1200 10/27/17 2017 10/27/17 2139 10/28/17 0450  BP: (!) 102/53 (!) 105/57 113/73 (!) 116/54  Pulse: 85 84 90 86  Resp: 20 20  20   Temp: 98.7 F (37.1 C) 98.9 F (37.2 C)  98.1 F (36.7 C)  TempSrc: Oral Oral  Oral  SpO2: 94% 94%  97%  Weight:    (!) 369 lb 9.6 oz (167.6 kg)  Height:        Intake/Output Summary (Last 24 hours) at 10/28/2017 0841 Last data filed at 10/28/2017 0829 Gross per 24 hour  Intake 597 ml  Output 750 ml  Net -153 ml   Filed Weights   10/26/17 0619 10/27/17 0630 10/28/17 0450  Weight: (!) 374 lb 1.6 oz (169.7 kg) (!) 367 lb 6.4 oz (166.7 kg) (!) 369 lb 9.6 oz (167.6 kg)    Telemetry    SR - Personally Reviewed  ECG     Physical Exam   BP (!) 116/54 (BP Location: Right Arm)   Pulse 86   Temp 98.1 F (36.7 C) (Oral)   Resp 20   Ht 6\' 1"  (1.854 m)   Wt (!) 369 lb 9.6 oz (167.6 kg)   SpO2 97%   BMI 48.76 kg/m   Affect appropriate Morbidly obese black male  HEENT: normal Neck supple with no adenopathy JVP normal no bruits no thyromegaly Lungs clear with no wheezing and good diaphragmatic motion Heart:  S1/S2 no murmur, no rub, gallop or click PMI normal Abdomen: benighn, BS positve, no tenderness, no AAA no bruit.  No HSM or HJR Distal pulses intact with no bruits Plus 3 LE edema Neuro non-focal Skin  warm and dry No muscular weakness   Labs    Chemistry Recent Labs  Lab 10/26/17 0606 10/27/17 0359 10/27/17 1124 10/28/17 0431  NA 139 136  --  137  K 4.1 5.5* 3.9 4.2  CL 92* 89*  --  93*  CO2 38* 35*  --  35*  GLUCOSE 93 94  --  109*  BUN 15 21*  --  29*  CREATININE 1.15 1.15  --  1.63*  CALCIUM 9.4 8.9  --  9.0  GFRNONAA >60 >60  --  53*  GFRAA >60 >60  --  >60  ANIONGAP 9 12  --  9     Hematology Recent Labs  Lab 10/21/17 1807 10/22/17 0356  WBC 8.4 7.3  RBC 6.19* 6.30*  HGB 16.1 16.1  HCT 51.0 52.2*  MCV 82.4 82.9  MCH 26.0 25.6*  MCHC 31.6 30.8  RDW 18.4* 18.0*  PLT 251 262    Cardiac Enzymes Recent Labs  Lab 10/22/17 0357 10/22/17 0935 10/22/17 1440  TROPONINI 0.06* 0.05* 0.05*    Recent Labs  Lab 10/21/17 1820 10/22/17 0009  TROPIPOC 0.08 0.09*     BNP Recent Labs  Lab 10/21/17 1807  BNP 231.7*     DDimer No results for input(s): DDIMER in the last 168 hours.   Radiology    No results found.  Cardiac Studies     Patient Profile     36 y.o. male admitted with acute on chronic systolic HF    Assessment & Plan   1. Acute on chronic systolic HF - 10/2017 echo LVEF 35-40%, grade I diastolic dysfunction, D-shaped septum suggesting RV pressure and volume overload, mod RV dysfunction, PASP 40, dilated IVC. LVEF in 2016 40% - poor medication compliance, - continue iv lasix for 2 more days then change to PO  - medical therapy with coreg, bidl, lisinopril, aldactone   2. NSVT - started on beta blocker, continue to monitior. Keep K at 4 and Mg at 2 - transient Wenchebach block at times, no other significant bradycardia. Continue to monitor on beta blocker, apparently has had low rates before on beta blockers  3. OSA - continue cpap  4. Hyperkalemia - improved  Lab Results  Component Value Date   CREATININE 1.63 (H) 10/28/2017   BUN 29 (H) 10/28/2017   NA 137 10/28/2017   K 4.2 10/28/2017   CL 93 (L) 10/28/2017   CO2  35 (H) 10/28/2017      Charlton HawsPeter Angelize Ryce

## 2017-10-28 NOTE — Progress Notes (Signed)
PROGRESS NOTE    Roger Ward  ZOX:[id number] DOB: Nov 28, 1981 DOA: 10/21/2017 PCP: Veryl Speak, FNP   Brief Narrative: 36 y.o.malewithhistory of chronic systolic and diastolic heart failure, morbid obesity, sleep apnea presents to the ER because of worsening shortness of breath. Patient states over the last few days patient has been getting progressively short of breath. Patient states he has not taken some of his medications including diuretics due to problem with finances. He has gained weight. Denies any chest pain productive cough fever chills.  ED Course:In the ER patient is found to be hypoxic with chest x-ray showing congestion. BNP was elevated. Troponin was mildly elevated. EKG shows sinus rhythm with RBBB. Patient has gained at least 25 pounds from recent past. Patient was given Lasix 80 mg IV admitted for CHF management. He was started on coreg for NSVT.he has a history of bradycardia with bb.he uses cpap every nite.     Assessment & Plan:   Active Problems:   Acute combined systolic and diastolic congestive heart failure (HCC)   OSA (obstructive sleep apnea)   Diabetes mellitus, type 2 (HCC)   Morbid obesity (HCC)   Demand ischemia (HCC)   Acute hypoxemic respiratory failure (HCC) Acute on chronic CHF systolic ejection fraction 35%./NSVT-  Patient has been losing weight and urine output negative by 1.6 later.  He reported his dry weight is 340 pounds and today he is 367 pounds.  But he still continues to have significant lower extremity edema.  Patient started on Lasix 80 mg 3 times a day yesterday along with metolazone which was added few days ago.  He did not show much improvement with Lasix 80 mg twice a day.  She has been started on Coreg 3.125 mg twice a day for an SVT.  Aim to keep the mag and potassium above 2 and 4.  He has not had any NSVT after starting Coreg.  He was not on a beta-blocker at home due to bradycardia which will which is being  monitored at this time.  Continue Coreg Lasix BiDil, lisinopril, Zaroxolyn, Aldactone 50 mg daily.  His potassium today is 5.5.  Noted that he is on Aldactone and ACE inhibitor.  And potassium replacement.  We will DC his potassium replacement and recheck levels tomorrow.  Meanwhile continue ACE inhibitors and Aldactone. Acute on chronic kidney disease-hyperkalemia /secondary to diuresis.  His creatinine has increased today from baseline.  He remains on Lasix 3 times a day 80 mg.  We will decrease the dose of ACE inhibitor in view of his increasing creatinine.      DVT prophylaxis: Lovenox Code Status:full  Family Communication:none Disposition Plan: tbd  Consultants: card  Procedures:none Antimicrobials:none  Subjective: Denies any shortness of breath or chest pain  Objective: Resting in bed in no acute distress Vitals:   10/27/17 2017 10/27/17 2139 10/28/17 0450 10/28/17 0856  BP: (!) 105/57 113/73 (!) 116/54 (!) 94/58  Pulse: 84 90 86 97  Resp: 20  20 18   Temp: 98.9 F (37.2 C)  98.1 F (36.7 C) 97.9 F (36.6 C)  TempSrc: Oral  Oral Oral  SpO2: 94%  97% 94%  Weight:   (!) 167.6 kg (369 lb 9.6 oz)   Height:        Intake/Output Summary (Last 24 hours) at 10/28/2017 1235 Last data filed at 10/28/2017 0829 Gross per 24 hour  Intake 597 ml  Output 350 ml  Net 247 ml   Filed Weights   10/26/17  [id number] 10/27/17 0630 10/28/17 0450  Weight: (!) 169.7 kg (374 lb 1.6 oz) (!) 166.7 kg (367 lb 6.4 oz) (!) 167.6 kg (369 lb 9.6 oz)    Examination:  General exam: Appears calm and comfortable  Respiratory system: Clear to auscultation. Respiratory effort normal. Cardiovascular system: S1 & S2 heard, RRR. No JVD, murmurs, rubs, gallops or clicks. No pedal edema. Gastrointestinal system: Abdomen is nondistended, soft and nontender. No organomegaly or masses felt. Normal bowel sounds heard. Central nervous system: Alert and oriented. No focal neurological deficits. Extremities:  Symmetric 5 x 5 power. Skin: No rashes, lesions or ulcers Psychiatry: Judgement and insight appear normal. Mood & affect appropriate.     Data Reviewed: I have personally reviewed following labs and imaging studies  CBC: Recent Labs  Lab 10/21/17 1807 10/22/17 0356  WBC 8.4 7.3  HGB 16.1 16.1  HCT 51.0 52.2*  MCV 82.4 82.9  PLT 251 262   Basic Metabolic Panel: Recent Labs  Lab 10/22/17 0356 10/23/17 0454 10/24/17 0608 10/25/17 0509 10/26/17 0606 10/27/17 0359 10/27/17 1124 10/28/17 0431  NA  --  141 140 138 139 136  --  137  K  --  4.8 4.7 3.8 4.1 5.5* 3.9 4.2  CL  --  93* 91* 89* 92* 89*  --  93*  CO2  --  41* 40* 38* 38* 35*  --  35*  GLUCOSE  --  122* 106* 95 93 94  --  109*  BUN  --  21* 18 15 15  21*  --  29*  CREATININE 1.20 1.55* 1.33* 1.14 1.15 1.15  --  1.63*  CALCIUM  --  8.6* 8.7* 9.2 9.4 8.9  --  9.0  MG 1.9 2.0  --   --  2.2 2.3  --   --    GFR: Estimated Creatinine Clearance: 101.9 mL/min (A) (by C-G formula based on SCr of 1.63 mg/dL (H)). Liver Function Tests: No results for input(s): AST, ALT, ALKPHOS, BILITOT, PROT, ALBUMIN in the last 168 hours. No results for input(s): LIPASE, AMYLASE in the last 168 hours. No results for input(s): AMMONIA in the last 168 hours. Coagulation Profile: No results for input(s): INR, PROTIME in the last 168 hours. Cardiac Enzymes: Recent Labs  Lab 10/22/17 0357 10/22/17 0935 10/22/17 1440  TROPONINI 0.06* 0.05* 0.05*   BNP (last 3 results) No results for input(s): PROBNP in the last 8760 hours. HbA1C: No results for input(s): HGBA1C in the last 72 hours. CBG: Recent Labs  Lab 10/23/17 0746 10/23/17 1110 10/23/17 1632 10/23/17 2118  GLUCAP 95 91 155* 133*   Lipid Profile: No results for input(s): CHOL, HDL, LDLCALC, TRIG, CHOLHDL, LDLDIRECT in the last 72 hours. Thyroid Function Tests: No results for input(s): TSH, T4TOTAL, FREET4, T3FREE, THYROIDAB in the last 72 hours. Anemia Panel: No results  for input(s): VITAMINB12, FOLATE, FERRITIN, TIBC, IRON, RETICCTPCT in the last 72 hours. Sepsis Labs: No results for input(s): PROCALCITON, LATICACIDVEN in the last 168 hours.  No results found for this or any previous visit (from the past 240 hour(s)).       Radiology Studies: No results found.      Scheduled Meds: . carvedilol  3.125 mg Oral BID WC  . fluticasone  1 spray Each Nare Daily  . furosemide  80 mg Intravenous TID  . isosorbide-hydrALAZINE  1 tablet Oral TID  . lisinopril  20 mg Oral BID  . loratadine  10 mg Oral Daily  . metolazone  2.5 mg  Oral Daily  . spironolactone  50 mg Oral Daily   Continuous Infusions:   LOS: 6 days    Alwyn Ren, MD [facility]  If 7PM-7AM, please contact night-coverage [email or web address] Password [facility] 10/28/2017, 12:35 PM

## 2017-10-28 NOTE — Progress Notes (Signed)
Placed patient on HS BIPAP via [facility]. Changed from settings of CPAP 20.0 cmH20 to auto BIPAP settings of max 20.0-min 6.0 cm H20. Pt. Tolerating well at this time.

## 2017-10-29 LAB — BASIC METABOLIC PANEL
Anion gap: 11 (ref 5–15)
BUN: 34 mg/dL — AB (ref 6–20)
CHLORIDE: 87 mmol/L — AB (ref 101–111)
CO2: 34 mmol/L — ABNORMAL HIGH (ref 22–32)
Calcium: 8.8 mg/dL — ABNORMAL LOW (ref 8.9–10.3)
Creatinine, Ser: 1.85 mg/dL — ABNORMAL HIGH (ref 0.61–1.24)
GFR calc Af Amer: 52 mL/min — ABNORMAL LOW (ref >60)
GFR calc non Af Amer: 45 mL/min — ABNORMAL LOW (ref >60)
GLUCOSE: 127 mg/dL — AB (ref 65–99)
POTASSIUM: 4.3 mmol/L (ref 3.5–5.1)
Sodium: 132 mmol/L — ABNORMAL LOW (ref 135–145)

## 2017-10-29 NOTE — Progress Notes (Signed)
PROGRESS NOTE    Roger Ward  ZOX:[id number] DOB: August 19, 1981 DOA: 10/21/2017 PCP: Veryl Speak, FNP  Brief Narrative:36 y.o.malewithhistory of chronic systolic and diastolic heart failure, morbid obesity, sleep apnea presents to the ER because of worsening shortness of breath. Patient states over the last few days patient has been getting progressively short of breath. Patient states he has not taken some of his medications including diuretics due to problem with finances. He has gained weight. Denies any chest pain productive cough fever chills.  ED Course:In the ER patient is found to be hypoxic with chest x-ray showing congestion. BNP was elevated. Troponin was mildly elevated. EKG shows sinus rhythm with RBBB. Patient has gained at least 25 pounds from recent past. Patient was given Lasix 80 mg IV admitted for CHF management. He was started on coreg for NSVT.he has a history of bradycardia with bb.he uses cpap every nite.    Assessment & Plan:   Active Problems:   Acute combined systolic and diastolic congestive heart failure (HCC)   OSA (obstructive sleep apnea)   Diabetes mellitus, type 2 (HCC)   Morbid obesity (HCC)   Demand ischemia (HCC)   Acute hypoxemic respiratory failure (HCC)   1] acute on chronic systolic heart failure with ejection fraction 35-patient admitted 11 5/18.  He has been on IV diuretics since admission.  Initially he was on Lasix twice a day 80 mg without much improvement.  Cardiology has increase his Lasix to 80 mg 3 times a day at this time few days ago.  The plan as of now is for him to continue IV Lasix 3 times a day today and converted to p.o. tomorrow.  I would think the patient would be able to be discharged  before the end of this week on p.o. diuretics.  Echocardiogram done at this admission showed ejection fraction 35-40%.  Patient is very noncompliant to medications at home due to financial situation.  Continue Coreg, lisinopril,  Aldactone, and bidil.  He is not on potassium replacement at this time due to hyperkalemia.  His creatinine has also been rising with the combined use of Aldactone and Lasix.  Monitor renal functions and adjust the diuretics.  I have DC'd his metolazone and ACE inhibitor today in view of his increasing creatinine.  2] NSVT-patient was started on low-dose beta-blocker due to recurrent NSVT.  His electrolytes were monitored and potassium and magnesium were kept above 4 at about 2.  Patient had a history of bradycardia with the use of beta-blocker so he was started on Coreg at a low dose.  So far he has tolerated that very well.  3] history of hypertension-blood pressure running low to low normal on Lasix Coreg lisinopril Aldactone and BiDil.  May have to adjust the dose prior to discharge . 4] obstructive sleep apnea patient has CPAP at home.   DVT prophylaxis: LOVENOX Code StatusFULL :Family Communication NONE : Disposition Plan: Discharge this week if okay with cardiology. Consultants:  Cardiology Procedures: None Antimicrobials: None  Subjective: Patient says he feels better  Objective: Sitting by the side of the bed in no acute distress.  Answers all my questions appropriately denies any shortness of breath or chest pain at this time. Vitals:   10/28/17 0856 10/28/17 1300 10/28/17 1947 10/29/17 0455  BP: (!) 94/58 (!) 92/58 (!) 95/55 (!) 97/47  Pulse: 97 92 87 84  Resp: 18 18 18 18   Temp: 97.9 F (36.6 C) 98 F (36.7 C) 98.3 F (36.8  C) 98 F (36.7 C)  TempSrc: Oral Oral Oral Oral  SpO2: 94% 93% 95% 98%  Weight:    (!) 166.8 kg (367 lb 11.2 oz)  Height:        Intake/Output Summary (Last 24 hours) at 10/29/2017 1029 Last data filed at 10/28/2017 1343 Gross per 24 hour  Intake 120 ml  Output -  Net 120 ml   Filed Weights   10/27/17 0630 10/28/17 0450 10/29/17 0455  Weight: (!) 166.7 kg (367 lb 6.4 oz) (!) 167.6 kg (369 lb 9.6 oz) (!) 166.8 kg (367 lb 11.2 oz)     Examination:  General exam: Appears calm and comfortable  Respiratory system: Clear to auscultation. Respiratory effort normal. Cardiovascular system: S1 & S2 heard, RRR. No JVD, murmurs, rubs, gallops or clicks. No pedal edema. Gastrointestinal system: Abdomen is nondistended, soft and nontender. No organomegaly or masses felt. Normal bowel sounds heard. Central nervous system: Alert and oriented. No focal neurological deficits. Extremities: Symmetric 5 x 5 power. Skin: No rashes, lesions or ulcers Psychiatry: Judgement and insight appear normal. Mood & affect appropriate.     Data Reviewed: I have personally reviewed following labs and imaging studies  CBC: No results for input(s): WBC, NEUTROABS, HGB, HCT, MCV, PLT in the last 168 hours. Basic Metabolic Panel: Recent Labs  Lab 10/23/17 0454  10/25/17 0509 10/26/17 0606 10/27/17 0359 10/27/17 1124 10/28/17 0431 10/29/17 0847  NA 141   < > 138 139 136  --  137 132*  K 4.8   < > 3.8 4.1 5.5* 3.9 4.2 4.3  CL 93*   < > 89* 92* 89*  --  93* 87*  CO2 41*   < > 38* 38* 35*  --  35* 34*  GLUCOSE 122*   < > 95 93 94  --  109* 127*  BUN 21*   < > 15 15 21*  --  29* 34*  CREATININE 1.55*   < > 1.14 1.15 1.15  --  1.63* 1.85*  CALCIUM 8.6*   < > 9.2 9.4 8.9  --  9.0 8.8*  MG 2.0  --   --  2.2 2.3  --   --   --    < > = values in this interval not displayed.   GFR: Estimated Creatinine Clearance: 89.6 mL/min (A) (by C-G formula based on SCr of 1.85 mg/dL (H)). Liver Function Tests: No results for input(s): AST, ALT, ALKPHOS, BILITOT, PROT, ALBUMIN in the last 168 hours. No results for input(s): LIPASE, AMYLASE in the last 168 hours. No results for input(s): AMMONIA in the last 168 hours. Coagulation Profile: No results for input(s): INR, PROTIME in the last 168 hours. Cardiac Enzymes: Recent Labs  Lab 10/22/17 1440  TROPONINI 0.05*   BNP (last 3 results) No results for input(s): PROBNP in the last 8760  hours. HbA1C: No results for input(s): HGBA1C in the last 72 hours. CBG: Recent Labs  Lab 10/23/17 0746 10/23/17 1110 10/23/17 1632 10/23/17 2118  GLUCAP 95 91 155* 133*   Lipid Profile: No results for input(s): CHOL, HDL, LDLCALC, TRIG, CHOLHDL, LDLDIRECT in the last 72 hours. Thyroid Function Tests: No results for input(s): TSH, T4TOTAL, FREET4, T3FREE, THYROIDAB in the last 72 hours. Anemia Panel: No results for input(s): VITAMINB12, FOLATE, FERRITIN, TIBC, IRON, RETICCTPCT in the last 72 hours. Sepsis Labs: No results for input(s): PROCALCITON, LATICACIDVEN in the last 168 hours.  No results found for this or any previous visit (from the past  240 hour(s)).       Radiology Studies: No results found.      Scheduled Meds: . carvedilol  3.125 mg Oral BID WC  . enoxaparin (LOVENOX) injection  30 mg Subcutaneous Q24H  . fluticasone  1 spray Each Nare Daily  . furosemide  80 mg Intravenous TID  . isosorbide-hydrALAZINE  1 tablet Oral TID  . lisinopril  5 mg Oral BID  . loratadine  10 mg Oral Daily  . metolazone  2.5 mg Oral Daily  . spironolactone  50 mg Oral Daily   Continuous Infusions:   LOS: 7 days    Alwyn RenElizabeth G Mathews, MD [facility]  If 7PM-7AM, please contact night-coverage [email or web address] Password [email or web address] 10/29/2017, 10:29 AM

## 2017-10-29 NOTE — Progress Notes (Signed)
Progress Note  Patient Name: Roger Ward Date of Encounter: 10/29/2017   Primary Cardiologist Croitoru   Subjective   Feels well   Inpatient Medications    Scheduled Meds: . carvedilol  3.125 mg Oral BID WC  . enoxaparin (LOVENOX) injection  30 mg Subcutaneous Q24H  . fluticasone  1 spray Each Nare Daily  . furosemide  80 mg Intravenous TID  . isosorbide-hydrALAZINE  1 tablet Oral TID  . lisinopril  5 mg Oral BID  . loratadine  10 mg Oral Daily  . metolazone  2.5 mg Oral Daily  . spironolactone  50 mg Oral Daily   Continuous Infusions:  PRN Meds: acetaminophen **OR** acetaminophen   Vital Signs    Vitals:   10/28/17 0856 10/28/17 1300 10/28/17 1947 10/29/17 0455  BP: (!) 94/58 (!) 92/58 (!) 95/55 (!) 97/47  Pulse: 97 92 87 84  Resp: 18 18 18 18   Temp: 97.9 F (36.6 C) 98 F (36.7 C) 98.3 F (36.8 C) 98 F (36.7 C)  TempSrc: Oral Oral Oral Oral  SpO2: 94% 93% 95% 98%  Weight:    (!) 367 lb 11.2 oz (166.8 kg)  Height:        Intake/Output Summary (Last 24 hours) at 10/29/2017 0824 Last data filed at 10/28/2017 1343 Gross per 24 hour  Intake 240 ml  Output -  Net 240 ml   Filed Weights   10/27/17 0630 10/28/17 0450 10/29/17 0455  Weight: (!) 367 lb 6.4 oz (166.7 kg) (!) 369 lb 9.6 oz (167.6 kg) (!) 367 lb 11.2 oz (166.8 kg)    Telemetry    SR - Personally Reviewed  ECG     Physical Exam   BP (!) 97/47 (BP Location: Right Arm)   Pulse 84   Temp 98 F (36.7 C) (Oral)   Resp 18   Ht 6\' 1"  (1.854 m)   Wt (!) 367 lb 11.2 oz (166.8 kg) Comment: scale c  SpO2 98%   BMI 48.51 kg/m   Affect appropriate Obese black male  HEENT: normal Neck supple with no adenopathy JVP normal no bruits no thyromegaly Lungs clear with no wheezing and good diaphragmatic motion Heart:  S1/S2 no murmur, no rub, gallop or click PMI normal Abdomen: benighn, BS positve, no tenderness, no AAA no bruit.  No HSM or HJR Distal pulses intact with no bruits Plus  2 LE edema Neuro non-focal Skin warm and dry No muscular weakness    Labs    Chemistry Recent Labs  Lab 10/26/17 0606 10/27/17 0359 10/27/17 1124 10/28/17 0431  NA 139 136  --  137  K 4.1 5.5* 3.9 4.2  CL 92* 89*  --  93*  CO2 38* 35*  --  35*  GLUCOSE 93 94  --  109*  BUN 15 21*  --  29*  CREATININE 1.15 1.15  --  1.63*  CALCIUM 9.4 8.9  --  9.0  GFRNONAA >60 >60  --  53*  GFRAA >60 >60  --  >60  ANIONGAP 9 12  --  9     Hematology No results for input(s): WBC, RBC, HGB, HCT, MCV, MCH, MCHC, RDW, PLT in the last 168 hours.  Cardiac Enzymes Recent Labs  Lab 10/22/17 0935 10/22/17 1440  TROPONINI 0.05* 0.05*    No results for input(s): TROPIPOC in the last 168 hours.   BNP No results for input(s): BNP, PROBNP in the last 168 hours.   DDimer No results for  input(s): DDIMER in the last 168 hours.   Radiology    No results found.  Cardiac Studies     Patient Profile     36 y.o. male admitted with acute on chronic systolic HF    Assessment & Plan   1. Acute on chronic systolic HF - 10/2017 echo LVEF 35-40%, grade I diastolic dysfunction, D-shaped septum suggesting RV pressure and volume overload, mod RV dysfunction, PASP 40, dilated IVC. LVEF in 2016 40% - poor medication compliance, - continue iv lasix for 1 more days then change to PO  - medical therapy with coreg, bidl, lisinopril, aldactone   2. NSVT - started on beta blocker, continue to monitior. Keep K at 4 and Mg at 2 - transient [place]bach block at times, no other significant bradycardia. Continue to monitor on beta blocker, apparently has had low rates before on beta blockers  3. OSA - continue cpap  4. Hyperkalemia  - improved getting azotemic change to PO lasix in am  Lab Results  Component Value Date   CREATININE 1.63 (H) 10/28/2017   BUN 29 (H) 10/28/2017   NA 137 10/28/2017   K 4.2 10/28/2017   CL 93 (L) 10/28/2017   CO2 35 (H) 10/28/2017      Charlton Haws

## 2017-10-29 NOTE — Plan of Care (Signed)
Patient states his dyspnea on exertion has resolved, continues to diurese.  No c/o of pain or other concerns.

## 2017-10-30 DIAGNOSIS — E1169 Type 2 diabetes mellitus with other specified complication: Secondary | ICD-10-CM

## 2017-10-30 DIAGNOSIS — G4733 Obstructive sleep apnea (adult) (pediatric): Secondary | ICD-10-CM

## 2017-10-30 LAB — CBC WITH DIFFERENTIAL/PLATELET
BAND NEUTROPHILS: 0 %
BASOS ABS: 0 10*3/uL (ref 0.0–0.1)
BASOS PCT: 0 %
BLASTS: 0 %
EOS ABS: 0.2 10*3/uL (ref 0.0–0.7)
EOS PCT: 5 %
HCT: 49.5 % (ref 39.0–52.0)
HEMOGLOBIN: 15.7 g/dL (ref 13.0–17.0)
LYMPHS ABS: 2.3 10*3/uL (ref 0.7–4.0)
Lymphocytes Relative: 51 %
MCH: 25.7 pg — AB (ref 26.0–34.0)
MCHC: 31.7 g/dL (ref 30.0–36.0)
MCV: 80.9 fL (ref 78.0–100.0)
METAMYELOCYTES PCT: 0 %
Monocytes Absolute: 0.8 10*3/uL (ref 0.1–1.0)
Monocytes Relative: 17 %
Myelocytes: 0 %
NEUTROS ABS: 1.2 10*3/uL — AB (ref 1.7–7.7)
Neutrophils Relative %: 27 %
OTHER: 0 %
Platelets: 301 10*3/uL (ref 150–400)
Promyelocytes Absolute: 0 %
RBC: 6.12 MIL/uL — ABNORMAL HIGH (ref 4.22–5.81)
RDW: 17.9 % — AB (ref 11.5–15.5)
WBC: 4.5 10*3/uL (ref 4.0–10.5)
nRBC: 0 /100 WBC

## 2017-10-30 LAB — BASIC METABOLIC PANEL
Anion gap: 9 (ref 5–15)
BUN: 35 mg/dL — AB (ref 6–20)
CHLORIDE: 92 mmol/L — AB (ref 101–111)
CO2: 33 mmol/L — AB (ref 22–32)
CREATININE: 1.45 mg/dL — AB (ref 0.61–1.24)
Calcium: 9 mg/dL (ref 8.9–10.3)
GFR calc non Af Amer: >60 mL/min (ref >60)
Glucose, Bld: 108 mg/dL — ABNORMAL HIGH (ref 65–99)
Potassium: 4.4 mmol/L (ref 3.5–5.1)
SODIUM: 134 mmol/L — AB (ref 135–145)

## 2017-10-30 LAB — PATHOLOGIST SMEAR REVIEW: PATH REVIEW: REACTIVE

## 2017-10-30 MED ORDER — SPIRONOLACTONE 50 MG PO TABS: 50.0000 mg | ORAL_TABLET | Freq: Every day | ORAL | 3 refills | Status: DC

## 2017-10-30 MED ORDER — FUROSEMIDE 80 MG PO TABS
80.0000 mg | ORAL_TABLET | Freq: Two times a day (BID) | ORAL | Status: DC
  Administered 2017-10-30: 80 mg via ORAL
  Filled 2017-10-30: qty 1

## 2017-10-30 MED ORDER — ISOSORB DINITRATE-HYDRALAZINE 20-37.5 MG PO TABS: 1.0000 | ORAL_TABLET | Freq: Three times a day (TID) | ORAL | 3 refills | Status: DC

## 2017-10-30 MED ORDER — CARVEDILOL 3.125 MG PO TABS: 3.1250 mg | ORAL_TABLET | Freq: Two times a day (BID) | ORAL | 2 refills | Status: DC

## 2017-10-30 MED ORDER — METOLAZONE 2.5 MG PO TABS: ORAL_TABLET | ORAL | 3 refills | Status: DC

## 2017-10-30 MED ORDER — FUROSEMIDE 80 MG PO TABS: ORAL_TABLET | ORAL | 2 refills | Status: DC

## 2017-10-30 NOTE — Progress Notes (Signed)
Pt being discharged home via wheelchair with family. Pt alert and oriented x4. VSS. Pt c/o no pain at this time. No signs of respiratory distress. Education complete and care plans resolved. IV removed with catheter intact and pt tolerated well. No further issues at this time. Pt to follow up with PCP. Tadeo Besecker R, RN 

## 2017-10-30 NOTE — Progress Notes (Signed)
Progress Note  Patient Name: Roger BasqueJerome L Fines Date of Encounter: 10/30/2017  Primary Cardiologist: Dr. Royann Shiversroitoru  Subjective   Pt feeling much better. No  DOE, orthopnea, palpitations, lightheadedness or chest discomfort.   Inpatient Medications    Scheduled Meds: . carvedilol  3.125 mg Oral BID WC  . enoxaparin (LOVENOX) injection  30 mg Subcutaneous Q24H  . fluticasone  1 spray Each Nare Daily  . furosemide  80 mg Oral BID  . isosorbide-hydrALAZINE  1 tablet Oral TID  . loratadine  10 mg Oral Daily  . spironolactone  50 mg Oral Daily   Continuous Infusions:  PRN Meds: acetaminophen **OR** acetaminophen   Vital Signs    Vitals:   10/29/17 1508 10/29/17 2149 10/29/17 2348 10/30/17 0557  BP: 112/64 115/68  (!) 127/50  Pulse: 84 88 88 98  Resp: 18  18 20   Temp: 98 F (36.7 C) 100 F (37.8 C)  98.1 F (36.7 C)  TempSrc: Oral Oral  Oral  SpO2: 98% 94% 94% 94%  Weight:    (!) 364 lb 8 oz (165.3 kg)  Height:        Intake/Output Summary (Last 24 hours) at 10/30/2017 0939 Last data filed at 10/30/2017 0849 Gross per 24 hour  Intake 600 ml  Output 1900 ml  Net -1300 ml   Filed Weights   10/28/17 0450 10/29/17 0455 10/30/17 0557  Weight: (!) 369 lb 9.6 oz (167.6 kg) (!) 367 lb 11.2 oz (166.8 kg) (!) 364 lb 8 oz (165.3 kg)    Telemetry    Sinus rhythm in the 80's-90's with rare NSVT- 5 beats - Personally Reviewed  ECG    No new tracings - Personally Reviewed  Physical Exam   GEN: Obese male, No acute distress.   Neck: No JVD Cardiac: RRR, no murmurs, rubs, or gallops.  Respiratory: Clear to auscultation bilaterally. GI: Soft, nontender, non-distended  MS: Lower leg edema, tight; No deformity. Neuro:  Nonfocal  Psych: Normal affect   Labs    Chemistry Recent Labs  Lab 10/28/17 0431 10/29/17 0847 10/30/17 0520  NA 137 132* 134*  K 4.2 4.3 4.4  CL 93* 87* 92*  CO2 35* 34* 33*  GLUCOSE 109* 127* 108*  BUN 29* 34* 35*  CREATININE 1.63* 1.85*  1.45*  CALCIUM 9.0 8.8* 9.0  GFRNONAA 53* 45* >60  GFRAA >60 52* >60  ANIONGAP 9 11 9      Hematology Recent Labs  Lab 10/30/17 0520  WBC 4.5  RBC 6.12*  HGB 15.7  HCT 49.5  MCV 80.9  MCH 25.7*  MCHC 31.7  RDW 17.9*  PLT 301    Cardiac EnzymesNo results for input(s): TROPONINI in the last 168 hours. No results for input(s): TROPIPOC in the last 168 hours.   BNPNo results for input(s): BNP, PROBNP in the last 168 hours.   DDimer No results for input(s): DDIMER in the last 168 hours.   Radiology    No results found.  Cardiac Studies   Echocardiogram 10/22/2017 Study Conclusions - Left ventricle: The cavity size was mildly dilated. Wall   thickness was normal. Systolic function was moderately reduced.   The estimated ejection fraction was in the range of 35% to 40%.   Diffuse hypokinesis. Doppler parameters are consistent with   abnormal left ventricular relaxation (grade 1 diastolic   dysfunction). - Ventricular septum: D-shaped interventricular septum suggestive   of RV pressure/volume overload. - Aortic valve: There was no stenosis. - Mitral valve: There  was no significant regurgitation. - Right ventricle: Poorly visualized. The cavity size was   moderately dilated. Systolic function was moderately reduced. - Right atrium: Poorly visualized. - Tricuspid valve: Peak RV-RA gradient (S): 25 mm Hg. - Pulmonary arteries: PA peak pressure: 40 mm Hg (S). - Systemic veins: IVC measured 3 cm with < 50% respirophasic   variation, suggesting RA pressure 15 mmHg.  Impressions: - Technically difficult study with poor acoustic windows. Mildly   dilated LV with EF 35-40%, diffuse hypokinesis (LV was difficult   to evaluate even with Definity echo contrast). The RV was poorly   visualized. Probably moderately dilated with moderately decreased   systolic function. D-shaped interventricular septum suggestive of   RV pressure/volume overload. Mild pulmonary hypertension.  Dilated   IVC suggestive of elevated RV filling pressure.  Patient Profile     36 y.o. male admitted with acute on chronic combined HF. Pt has history of morbid obesity and sleep apnea on CPAP. Pt hypoxic on presentation, elevated BNP, troponin mildly elevated, 25 lb wt gain.   Assessment & Plan    1. Acute on chronic systolic heart failure -10/22/17 echo showed LVEF 35-40%, grade 1 DD, D-shaped septum suggesting RV pressure and volume overload, mod RV dysfunction, PASP 40, dilated IVC. LVEF in 2016 40% -Poor medication compliance -Has been diuresed on IV lasix 80 mg TID. WT down from 402 lbs to 364 lbs!. (3 lbs down from yesterday) Fluid status is net negative 8.7L. Will switch to oral diuretic today.  -Renal function ins stable, a little better this am.   -Medical therapy with coreg, Bidil, lisinopril and aldactone.  -Discussed medication compliance, daily weights and what to report, sodium restriction. Pt verbalizes understanding that these things will help keep him feeling well and out of the hospital.  2. NSVT -started on beta blocker, continuing to monitor. Pt had 2 runs of 5 beats NSVT in last 24h.  -Keep K at 4 and Mag at 2.  Today K+ is 4.4  3. OSA -continue CPAP  4. Hyperkalemia -Improved. K+ 4.4 today   For questions or updates, please contact [facility] Please consult [email or web address] for contact info under Cardiology/STEMI.      Signed, Berton Bon, NP  10/30/2017, 9:39 AM    Patient examined chart reviewed. Good diuresis and less LE edema Still with morbid obesity Agree with change to oral diuretics and d/c home over next 24-48 hours Outpatient f/u with Dr Royann Shivers  Will sign off   Charlton Haws

## 2017-10-30 NOTE — Discharge Summary (Signed)
Physician Discharge Summary  IAIN SAWCHUK ZOX:[id number] DOB: [birth year] DOA: 10/21/2017  PCP: Veryl Speak, FNP  Admit date: 10/21/2017 Discharge date: 10/30/2017  Admitted From: Home Discharge disposition: Home   Recommendations for Outpatient Follow-Up:   1. Needs close outpatient follow-up with heart failure team.   Discharge Diagnosis:   Principal Problem:   Acute combined systolic and diastolic congestive heart failure (HCC) Active Problems:   OSA (obstructive sleep apnea)   Diabetes mellitus, type 2 (HCC)   Morbid obesity (HCC)   Demand ischemia (HCC)   Acute hypoxemic respiratory failure (HCC)   Hypoxia   Discharge Condition: Improved.  Diet recommendation: Low sodium, heart healthy.  Carbohydrate-modified.    History of Present Illness:   Roger Ward is an 36 y.o. male with a PMH of chronic systolic/diastolic CHF, morbid obesity, OSA who was admitted 10/21/17 for evaluation of worsening shortness of breath and increased weight gain. Upon initial evaluation, noted to be hypoxic with chest x-ray showing congestion. BNP was elevated and troponin was mildly elevated. Admitted for CHF exacerbation.  Hospital Course by Problem:   Principal Problem:   Acute combined systolic and diastolic congestive heart failure (HCC) with demand ischemia and acute hypoxic respiratory failure Noted to be noncompliant with medications. 2-D echo done 10/22/17. EF 35-40 percent, diffuse hypokinesis, grade 1 diastolic dysfunction. Cardiology following. Has been on IV Lasix since admission as well as Coreg, BiDil, lisinopril and Aldactone. I/O balance -1.5 L and weight has steadily dropped since admission. DC home on oral Lasix.  Active Problems:   Acute kidney injury Creatinine up yesterday, but improved today, likely from diuretics. DC IV diuretics and transitioned to by mouth Lasix now.    Nonsustained V. Tach Occurred in the setting of electrolyte imbalances including  hypomagnesemia. Continue Coreg.    OSA (obstructive sleep apnea) Continue CPAP.    Diabetes mellitus, type 2 (HCC) Recommend carbohydrate modified diet and weight loss.    Morbid obesity Body mass index is 48.09 kg/m. Weight loss encouraged.  Medical Consultants:    Cardiology   Discharge Exam:   Vitals:   10/29/17 2348 10/30/17 0557  BP:  (!) 127/50  Pulse: 88 98  Resp: 18 20  Temp:  98.1 F (36.7 C)  SpO2: 94% 94%   Vitals:   10/29/17 1508 10/29/17 2149 10/29/17 2348 10/30/17 0557  BP: 112/64 115/68  (!) 127/50  Pulse: 84 88 88 98  Resp: 18  18 20   Temp: 98 F (36.7 C) 100 F (37.8 C)  98.1 F (36.7 C)  TempSrc: Oral Oral  Oral  SpO2: 98% 94% 94% 94%  Weight:    (!) 165.3 kg (364 lb 8 oz)  Height:        General exam: Appears calm and comfortable. Morbidly obese Respiratory system: Clear to auscultation. Respiratory effort normal. Cardiovascular system: S1 & S2 heard, RRR. No JVD,  rubs, gallops or clicks. No murmurs. Gastrointestinal system: Abdomen is nondistended, soft and nontender. No organomegaly or masses felt. Normal bowel sounds heard. Central nervous system: Alert and oriented. No focal neurological deficits. Extremities: No clubbing,  or cyanosis. 2+ edema. Skin: No rashes, lesions or ulcers. Psychiatry: Judgement and insight appear normal. Mood & affect appropriate.    The results of significant diagnostics from this hospitalization (including imaging, microbiology, ancillary and laboratory) are listed below for reference.     Procedures and Diagnostic Studies:   Dg Chest 2 View  Result Date: 10/21/2017 CLINICAL DATA:  CHF 2 weeks.  EXAM: CHEST  2 VIEW COMPARISON:  03/05/2015 FINDINGS: Lungs are hypoinflated without lobar consolidation or effusion. There is mild prominence of the perihilar markings unchanged to slightly improved. Moderate stable cardiomegaly. Remainder of the exam is unchanged. IMPRESSION: Moderate stable cardiomegaly with  mild vascular congestion which is unchanged to slightly improved. Electronically Signed   By: Elberta Fortis M.D.   On: 10/21/2017 19:02     Labs:   Basic Metabolic Panel: Recent Labs  Lab 10/26/17 0606 10/27/17 0359  10/28/17 0431 10/29/17 0847 10/30/17 0520  NA 139 136  --  137 132* 134*  K 4.1 5.5*   < > 4.2 4.3 4.4  CL 92* 89*  --  93* 87* 92*  CO2 38* 35*  --  35* 34* 33*  GLUCOSE 93 94  --  109* 127* 108*  BUN 15 21*  --  29* 34* 35*  CREATININE 1.15 1.15  --  1.63* 1.85* 1.45*  CALCIUM 9.4 8.9  --  9.0 8.8* 9.0  MG 2.2 2.3  --   --   --   --    < > = values in this interval not displayed.   GFR Estimated Creatinine Clearance: 113.7 mL/min (A) (by C-G formula based on SCr of 1.45 mg/dL (H)).  CBC: Recent Labs  Lab 10/30/17 0520  WBC 4.5  NEUTROABS 1.2*  HGB 15.7  HCT 49.5  MCV 80.9  PLT 301   CBG: Recent Labs  Lab 10/23/17 2118  GLUCAP 133*     Discharge Instructions:   Discharge Instructions    (HEART FAILURE PATIENTS) Call MD:  Anytime you have any of the following symptoms: 1) 3 pound weight gain in 24 hours or 5 pounds in 1 week 2) shortness of breath, with or without a dry hacking cough 3) swelling in the hands, feet or stomach 4) if you have to sleep on extra pillows at night in order to breathe.   Complete by:  As directed    Call MD for:  extreme fatigue   Complete by:  As directed    Call MD for:  persistant dizziness or light-headedness   Complete by:  As directed    Diet - low sodium heart healthy   Complete by:  As directed    Increase activity slowly   Complete by:  As directed      Allergies as of 10/30/2017      Reactions   Penicillins Swelling      Medication List    STOP taking these medications   amLODipine 10 MG tablet Commonly known as:  NORVASC   Chlorpheniramine-DM 4-30 MG Tabs     TAKE these medications   carvedilol 3.125 MG tablet Commonly known as:  COREG Take 1 tablet (3.125 mg total) 2 (two) times daily  with a meal by mouth.   cetirizine 10 MG tablet Commonly known as:  ZYRTEC Take 1 tablet (10 mg total) by mouth daily.   fluticasone 50 MCG/ACT nasal spray Commonly known as:  FLONASE Place 1 spray into both nostrils daily. 1 spray in each nostril every day   furosemide 80 MG tablet Commonly known as:  LASIX TAKE 1 TABLET (80 MG TOTAL) BY MOUTH 2 (TWO) TIMES DAILY.   isosorbide-hydrALAZINE 20-37.5 MG tablet Commonly known as:  BIDIL Take 1 tablet 3 (three) times daily by mouth.   lisinopril 20 MG tablet Commonly known as:  PRINIVIL,ZESTRIL Take 1 tablet (20 mg total) by mouth 2 (two) times daily.  metolazone 2.5 MG tablet Commonly known as:  ZAROXOLYN Take 1 tab (2.5) tab by mouth Tuesday, Thursday and Saturday   spironolactone 50 MG tablet Commonly known as:  ALDACTONE Take 1 tablet (50 mg total) daily by mouth.   sulfacetamide 10 % ophthalmic solution Commonly known as:  BLEPH-10 Place 1-2 drops into the left eye every 2 (two) hours while awake.      Follow-up Information    Croitoru, Mihai, MD. Go on 11/12/2017.   Specialty:  Cardiology Why:  @9 :40am Contact information: [address] [place] [address] [phone]            Time coordinating discharge: 35 minutes  Signed:  RAMA,CHRISTINA  Pager [phone] [facility] 10/30/2017, 5:41 PM

## 2017-10-31 ENCOUNTER — Encounter: Payer: Self-pay | Admitting: Cardiovascular Disease

## 2017-11-12 ENCOUNTER — Encounter: Payer: Self-pay | Admitting: *Deleted

## 2017-11-12 ENCOUNTER — Ambulatory Visit (INDEPENDENT_AMBULATORY_CARE_PROVIDER_SITE_OTHER): Payer: Managed Care, Other (non HMO) | Admitting: Cardiology

## 2017-11-12 ENCOUNTER — Encounter: Payer: Self-pay | Admitting: Cardiology

## 2017-11-12 VITALS — BP 123/75 | HR 98 | Ht 69.0 in | Wt 359.0 lb

## 2017-11-12 DIAGNOSIS — I5041 Acute combined systolic (congestive) and diastolic (congestive) heart failure: Secondary | ICD-10-CM

## 2017-11-12 DIAGNOSIS — N183 Chronic kidney disease, stage 3 unspecified: Secondary | ICD-10-CM

## 2017-11-12 DIAGNOSIS — I472 Ventricular tachycardia: Secondary | ICD-10-CM

## 2017-11-12 DIAGNOSIS — G4733 Obstructive sleep apnea (adult) (pediatric): Secondary | ICD-10-CM

## 2017-11-12 DIAGNOSIS — I110 Hypertensive heart disease with heart failure: Secondary | ICD-10-CM | POA: Insufficient documentation

## 2017-11-12 DIAGNOSIS — I420 Dilated cardiomyopathy: Secondary | ICD-10-CM

## 2017-11-12 DIAGNOSIS — I441 Atrioventricular block, second degree: Secondary | ICD-10-CM | POA: Diagnosis not present

## 2017-11-12 DIAGNOSIS — I4729 Other ventricular tachycardia: Secondary | ICD-10-CM

## 2017-11-12 NOTE — Assessment & Plan Note (Signed)
On Bipap.

## 2017-11-12 NOTE — Addendum Note (Signed)
Addended by: Oleta Mouse on: 11/12/2017 03:06 PM   Modules accepted: Orders

## 2017-11-12 NOTE — Assessment & Plan Note (Signed)
Low dose beta blocker added Nov 2018

## 2017-11-12 NOTE — Addendum Note (Signed)
Addended by: Oleta Mouse on: 11/12/2017 10:39 AM   Modules accepted: Orders

## 2017-11-12 NOTE — Assessment & Plan Note (Signed)
Admitted Nov 2018- 40 lbs over his dry wgt secondary to non compliance ($$) Diuresed to 359 lbs (from 402 on adm)

## 2017-11-12 NOTE — Progress Notes (Signed)
11/12/2017 Roger Ward   May 26, 1981  [id number]  Primary Physician Veryl Speak, FNP Primary Cardiologist: Dr Royann Shivers  HPI:  36 y/o morbidly obese AA male, works in [employer], with a history of cardiomyopathy (presumed to be NICM), OSA, HTN, and CRI. He was admitted 10/21/17-10/25/17 with acute on chronic systolic CHF secondary to non compliance with medical Rx ($$ issues). He was 50 lbs above his base weight. Echo revealed an EF of 35-40%. He had NSVT runs. He had not been on beta blocker previously secondary to AVB. Low dose Coreg was added. He is in the office today for follow up. He says he has been doing well.He actually went back to work last week. He is taking his medications as prescribed.    Current Outpatient Medications  Medication Sig Dispense Refill  . carvedilol (COREG) 3.125 MG tablet Take 1 tablet (3.125 mg total) 2 (two) times daily with a meal by mouth. 60 tablet 2  . cetirizine (ZYRTEC) 10 MG tablet Take 1 tablet (10 mg total) by mouth daily. 30 tablet 0  . furosemide (LASIX) 80 MG tablet TAKE 1 TABLET (80 MG TOTAL) BY MOUTH 2 (TWO) TIMES DAILY. 60 tablet 2  . isosorbide-hydrALAZINE (BIDIL) 20-37.5 MG tablet Take 1 tablet 3 (three) times daily by mouth. 90 tablet 3  . lisinopril (PRINIVIL,ZESTRIL) 20 MG tablet Take 1 tablet (20 mg total) by mouth 2 (two) times daily. 180 tablet 2  . metolazone (ZAROXOLYN) 2.5 MG tablet Take 1 tab (2.5) tab by mouth Tuesday, Thursday and Saturday 30 tablet 3  . spironolactone (ALDACTONE) 50 MG tablet Take 1 tablet (50 mg total) daily by mouth. 30 tablet 3  . fluticasone (FLONASE) 50 MCG/ACT nasal spray Place 1 spray into both nostrils daily. 1 spray in each nostril every day (Patient not taking: Reported on 11/12/2017) 16 g 5  . sulfacetamide (BLEPH-10) 10 % ophthalmic solution Place 1-2 drops into the left eye every 2 (two) hours while awake. (Patient not taking: Reported on 10/22/2017) 10 mL 0   No current facility-administered  medications for this visit.     Allergies  Allergen Reactions  . Penicillins Swelling    Past Medical History:  Diagnosis Date  . 2nd degree AV block    a. noted during 02/2015 admission - second degree AV AV block (not further defined in notes). Not on BB due to this.  . Asthma    Childhood  . Bradycardia    a. noted during 02/2015 admission - second degree AV AV block (not further defined in notes). Not on BB due to this.  . Chronic combined systolic and diastolic CHF (congestive heart failure) (HCC)    a. echo 03/06/15 showing mild LVH, EF 40%, high ventricular filling pressures, mod MR, mildly dilated RV/mildly reduced RV function, mildly dilated RA, PASP .  . Diabetes mellitus, type 2 (HCC)   . Essential hypertension   . Family history of adverse reaction to anesthesia    mother and aunt  both put to sleep for surgery and "stopped breathing"  . Morbid obesity (HCC)   . NSVT (nonsustained ventricular tachycardia) (HCC)    a. noted during 02/2015 admission.  . OSA (obstructive sleep apnea)    a. noncompliance with CPAP - wakes up with it off.    Social History   Socioeconomic History  . Marital status: Married    Spouse name: Not on file  . Number of children: 0  . Years of education: 19  .  Highest education level: Not on file  Social Needs  . Financial resource strain: Not on file  . Food insecurity - worry: Not on file  . Food insecurity - inability: Not on file  . Transportation needs - medical: Not on file  . Transportation needs - non-medical: Not on file  Occupational History  . Occupation: Merchandiser, retailupervisor at [employer]  Tobacco Use  . Smoking status: Current Every Day Smoker    Packs/day: 0.50    Years: 19.00    Pack years: 9.50    Types: Cigarettes    Start date: 01/05/1996    Last attempt to quit: 03/04/2015    Years since quitting: 2.6  . Smokeless tobacco: Never Used  . Tobacco comment: Already decreasaed smoking  Substance and Sexual Activity   . Alcohol use: No    Alcohol/week: 0.6 - 1.2 oz    Types: 1 - 2 Standard drinks or equivalent per week    Comment: Occasional  . Drug use: No  . Sexual activity: Yes    Birth control/protection: Condom  Other Topics Concern  . Not on file  Social History Narrative   [place] Fun: Bowling, movies, we go   Denies religious beliefs that would effect health care.      Family History  Problem Relation Age of Onset  . Hypertension Mother   . Hyperlipidemia Mother   . Heart failure Mother   . Multiple sclerosis Mother   . Breast cancer Maternal Grandmother   . Diabetes Maternal Grandmother   . Colon cancer Maternal Uncle      Review of Systems: General: negative for chills, fever, night sweats or weight changes.  Cardiovascular: negative for chest pain, dyspnea on exertion, edema, orthopnea, palpitations, paroxysmal nocturnal dyspnea or shortness of breath Dermatological: negative for rash Respiratory: negative for cough or wheezing Urologic: negative for hematuria Abdominal: negative for nausea, vomiting, diarrhea, bright red blood per rectum, melena, or hematemesis Neurologic: negative for visual changes, syncope, or dizziness All other systems reviewed and are otherwise negative except as noted above.    Blood pressure 123/75, pulse 98, height 5\' 9"  (1.753 m), weight (!) 359 lb (162.8 kg).  General appearance: alert, cooperative, no distress and morbidly obese Lungs: decreased breath sounds Heart: regular rate and rhythm Extremities: chronic venous skin changes, no edema Neurologic: Grossly normal   ASSESSMENT AND PLAN:   Acute combined systolic and diastolic congestive heart failure (HCC) Admitted Nov 2018- 40 lbs over his dry wgt secondary to non compliance ($$) Diuresed to 359 lbs (from 402 on adm)  AV block, 2nd degree Noted during 02/2015 admission - second degree AV AV block. He was not on beta blocker till Nov 2018 adm when he had  NSVT  Cardiomyopathy [facility](HCC) EF 35-40% by echo Nov 2018  Hypertensive cardiomegaly with heart failure (HCC) HTN cardiomyopathy with CHF, admitted Nov 2018  Morbid obesity (HCC) BMI 48  NSVT (nonsustained ventricular tachycardia) (HCC) Low dose beta blocker added Nov 2018  OSA (obstructive sleep apnea) On Bi pap  Chronic renal insufficiency, stage III (moderate) (HCC) SCr down to 1.4 at discharge   PLAN  Same Rx. RTC 4-6 weeks for follow up. Check BMP then. If he can demonstrate medication compliance consider transition to an ARB- then [facility].  Corine ShelterLuke Eaton Folmar PA-C 11/12/2017 10:35 AM

## 2017-11-12 NOTE — Assessment & Plan Note (Signed)
SCr down to 1.4 at discharge

## 2017-11-12 NOTE — Assessment & Plan Note (Signed)
Noted during 02/2015 admission - second degree AV AV block. He was not on beta blocker till Nov 2018 adm when he had NSVT

## 2017-11-12 NOTE — Patient Instructions (Addendum)
Medication Instructions:   Your physician recommends that you continue on your current medications as directed. Please refer to the Current Medication list given to you today.   If you need a refill on your cardiac medications before your next appointment, please call your pharmacy.  Labwork:  BMP  IN 6 WEEKS    Testing/Procedures:  NONE ORDERED  TODAY   Follow-Up:  WITH KILROY IN 6 WEEKS    Any Other Special Instructions Will Be Listed Below (If Applicable).

## 2017-11-12 NOTE — Assessment & Plan Note (Signed)
BMI 48 

## 2017-11-12 NOTE — Assessment & Plan Note (Signed)
EF 35-40% by echo Nov 2018

## 2017-11-12 NOTE — Assessment & Plan Note (Signed)
HTN cardiomyopathy with CHF, admitted Nov 2018

## 2018-01-02 ENCOUNTER — Ambulatory Visit (INDEPENDENT_AMBULATORY_CARE_PROVIDER_SITE_OTHER): Payer: Managed Care, Other (non HMO) | Admitting: Cardiology

## 2018-01-02 ENCOUNTER — Encounter: Payer: Self-pay | Admitting: Cardiology

## 2018-01-02 VITALS — BP 134/77 | HR 100 | Ht 73.0 in | Wt 371.6 lb

## 2018-01-02 DIAGNOSIS — N183 Chronic kidney disease, stage 3 unspecified: Secondary | ICD-10-CM

## 2018-01-02 DIAGNOSIS — I441 Atrioventricular block, second degree: Secondary | ICD-10-CM | POA: Diagnosis not present

## 2018-01-02 DIAGNOSIS — I110 Hypertensive heart disease with heart failure: Secondary | ICD-10-CM

## 2018-01-02 DIAGNOSIS — G4733 Obstructive sleep apnea (adult) (pediatric): Secondary | ICD-10-CM | POA: Diagnosis not present

## 2018-01-02 DIAGNOSIS — I5041 Acute combined systolic (congestive) and diastolic (congestive) heart failure: Secondary | ICD-10-CM | POA: Diagnosis not present

## 2018-01-02 DIAGNOSIS — I420 Dilated cardiomyopathy: Secondary | ICD-10-CM | POA: Diagnosis not present

## 2018-01-02 LAB — BASIC METABOLIC PANEL
BUN/Creatinine Ratio: 16 (ref 9–20)
BUN: 16 mg/dL (ref 6–20)
CO2: 29 mmol/L (ref 20–29)
Calcium: 9.6 mg/dL (ref 8.7–10.2)
Chloride: 94 mmol/L — ABNORMAL LOW (ref 96–106)
Creatinine, Ser: 0.97 mg/dL (ref 0.76–1.27)
GFR calc Af Amer: 116 mL/min/{1.73_m2} (ref >59)
GFR calc non Af Amer: 100 mL/min/{1.73_m2} (ref >59)
Glucose: 88 mg/dL (ref 65–99)
Potassium: 4.7 mmol/L (ref 3.5–5.2)
Sodium: 140 mmol/L (ref 134–144)

## 2018-01-02 MED ORDER — LOSARTAN POTASSIUM 50 MG PO TABS: 50.0000 mg | ORAL_TABLET | Freq: Every day | ORAL | 5 refills | Status: DC

## 2018-01-02 MED ORDER — CARVEDILOL 6.25 MG PO TABS: 6.2500 mg | ORAL_TABLET | Freq: Two times a day (BID) | ORAL | 5 refills | Status: DC

## 2018-01-02 NOTE — Progress Notes (Signed)
01/02/2018 Roger Ward   1981-11-05  [id number]  Primary Physician Veryl Speak, FNP Primary Cardiologist: Dr Royann Shivers  HPI:  37 y/o morbidly obese AA male, works in [employer], with a history of cardiomyopathy (presumed to be NICM), OSA on C-pap, HTN, and CRI- 2. He was admitted 10/21/17-10/25/17 with acute on chronic systolic CHF secondary to non compliance with medications ($$ issues). He was 50 lbs above his base weight. Echo revealed an EF of 35-40%. He had NSVT runs. He had not been on beta blocker previously secondary to AVB. Low dose Coreg was added then. I saw him in f/u 11/12/17. He is in the office today for follow up. He has been compliant with his medications. He does the best he can with low sodium diet. He says he feels well though his wgt is up from his LOV. He is complaint with C-pap.    Current Outpatient Medications  Medication Sig Dispense Refill  . carvedilol (COREG) 3.125 MG tablet Take 1 tablet (3.125 mg total) 2 (two) times daily with a meal by mouth. 60 tablet 2  . cetirizine (ZYRTEC) 10 MG tablet Take 1 tablet (10 mg total) by mouth daily. 30 tablet 0  . fluticasone (FLONASE) 50 MCG/ACT nasal spray Place 1 spray into both nostrils daily. 1 spray in each nostril every day 16 g 5  . furosemide (LASIX) 80 MG tablet TAKE 1 TABLET (80 MG TOTAL) BY MOUTH 2 (TWO) TIMES DAILY. 60 tablet 2  . isosorbide-hydrALAZINE (BIDIL) 20-37.5 MG tablet Take 1 tablet 3 (three) times daily by mouth. 90 tablet 3  . lisinopril (PRINIVIL,ZESTRIL) 20 MG tablet Take 1 tablet (20 mg total) by mouth 2 (two) times daily. 180 tablet 2  . metolazone (ZAROXOLYN) 2.5 MG tablet Take 1 tab (2.5) tab by mouth Tuesday, Thursday and Saturday 30 tablet 3  . spironolactone (ALDACTONE) 50 MG tablet Take 1 tablet (50 mg total) daily by mouth. 30 tablet 3  . sulfacetamide (BLEPH-10) 10 % ophthalmic solution Place 1-2 drops into the left eye every 2 (two) hours while awake. 10 mL 0   No current  facility-administered medications for this visit.     Allergies  Allergen Reactions  . Penicillins Swelling    Past Medical History:  Diagnosis Date  . 2nd degree AV block    a. noted during 02/2015 admission - second degree AV AV block (not further defined in notes). Not on BB due to this.  . Asthma    Childhood  . Bradycardia    a. noted during 02/2015 admission - second degree AV AV block (not further defined in notes). Not on BB due to this.  . Chronic combined systolic and diastolic CHF (congestive heart failure) (HCC)    a. echo 03/06/15 showing mild LVH, EF 40%, high ventricular filling pressures, mod MR, mildly dilated RV/mildly reduced RV function, mildly dilated RA, PASP .  . Diabetes mellitus, type 2 (HCC)   . Essential hypertension   . Family history of adverse reaction to anesthesia    mother and aunt  both put to sleep for surgery and "stopped breathing"  . Morbid obesity (HCC)   . NSVT (nonsustained ventricular tachycardia) (HCC)    a. noted during 02/2015 admission.  . OSA (obstructive sleep apnea)    a. noncompliance with CPAP - wakes up with it off.    Social History   Socioeconomic History  . Marital status: Married    Spouse name: Not on file  . Number  of children: 0  . Years of education: 49  . Highest education level: Not on file  Social Needs  . Financial resource strain: Not on file  . Food insecurity - worry: Not on file  . Food insecurity - inability: Not on file  . Transportation needs - medical: Not on file  . Transportation needs - non-medical: Not on file  Occupational History  . Occupation: Merchandiser, retail at [employer]  . Smoking status: Current Every Day Smoker    Packs/day: 0.50    Years: 19.00    Pack years: 9.50    Types: Cigarettes    Start date: 01/05/1996    Last attempt to quit: 03/04/2015    Years since quitting: 2.8  . Smokeless tobacco: Never Used  . Tobacco comment: Already decreasaed smoking    Substance and Sexual Activity  . Alcohol use: No    Alcohol/week: 0.6 - 1.2 oz    Types: 1 - 2 Standard drinks or equivalent per week    Comment: Occasional  . Drug use: No  . Sexual activity: Yes    Birth control/protection: Condom  Other Topics Concern  . Not on file  Social History Narrative   Born and raised in [place]. Fun: Bowling, movies, we go   Denies religious beliefs that would effect health care.      Family History  Problem Relation Age of Onset  . Hypertension Mother   . Hyperlipidemia Mother   . Heart failure Mother   . Multiple sclerosis Mother   . Breast cancer Maternal Grandmother   . Diabetes Maternal Grandmother   . Colon cancer Maternal Uncle      Review of Systems: General: negative for chills, fever, night sweats or weight changes.  Cardiovascular: negative for chest pain, dyspnea on exertion, edema, orthopnea, palpitations, paroxysmal nocturnal dyspnea or shortness of breath Dermatological: negative for rash Respiratory: negative for cough or wheezing Urologic: negative for hematuria Abdominal: negative for nausea, vomiting, diarrhea, bright red blood per rectum, melena, or hematemesis Neurologic: negative for visual changes, syncope, or dizziness All other systems reviewed and are otherwise negative except as noted above.    Blood pressure 134/77, pulse 100, height 6\' 1"  (1.854 m), weight (!) 371 lb 9.6 oz (168.6 kg), SpO2 94 %.  General appearance: alert, cooperative, no distress and morbidly obese Lungs: clear to auscultation bilaterally Heart: regular rate and rhythm Neurologic: Grossly normal  EKG NSR, RBBB, rate 96  ASSESSMENT AND PLAN:    Acute combined systolic and diastolic congestive heart failure (HCC) Admitted Nov 2018- 40 lbs over his dry wgt secondary to non compliance ($$) Diuresed to 359 lbs (from 402 on adm). Wgt today 370 lbs  AV block, 2nd degree Noted during 02/2015 admission - second degree AV AV block. He was  not on beta blocker till Nov 2018 adm when he had NSVT  Cardiomyopathy (HCC) EF 35-40% by echo Nov 2018  Hypertensive cardiomegaly with heart failure (HCC) HTN cardiomyopathy with CHF, admitted Nov 2018  Morbid obesity (HCC) BMI 48  NSVT (nonsustained ventricular tachycardia) (HCC) Low dose beta blocker added Nov 2018  OSA (obstructive sleep apnea) On Bi pap  Chronic renal insufficiency, stage III (moderate) (HCC) SCr down to 1.4 at discharge   PLAN  I have suggested we increase his Coreg to 6.25 mg BID and change his Lisinopril to Losartan 50 mg. I'll see him back in two weeks and plan on starting Entresto then. Check BMP today. If his B/P comes  inder better control consider stopping BiDil.   Corine Shelter PA-C 01/02/2018 10:55 AM

## 2018-01-02 NOTE — Patient Instructions (Addendum)
Medication Instructions:  INCREASE Coreg 6.25mg  take 1 tablet twice a day STOP Lisinopril START Losartan 50mg  Take 1 tablet once a day   Labwork: Your physician recommends that you return for lab work in: TODAY-BMP  Testing/Procedures: None   Follow-Up: Your physician recommends that you schedule a follow-up appointment in: 2 weeks with Roger Shelter, PA-C  Any Other Special Instructions Will Be Listed Below (If Applicable).  If you need a refill on your cardiac medications before your next appointment, please call your pharmacy.

## 2018-01-07 IMAGING — DX DG CHEST 2V
2 series · 2 of 2 positions shown · non-contrast
Comparison: 03/05/2015

CLINICAL DATA: CHF 2 weeks.

EXAM:
CHEST  2 VIEW

[chest pa]
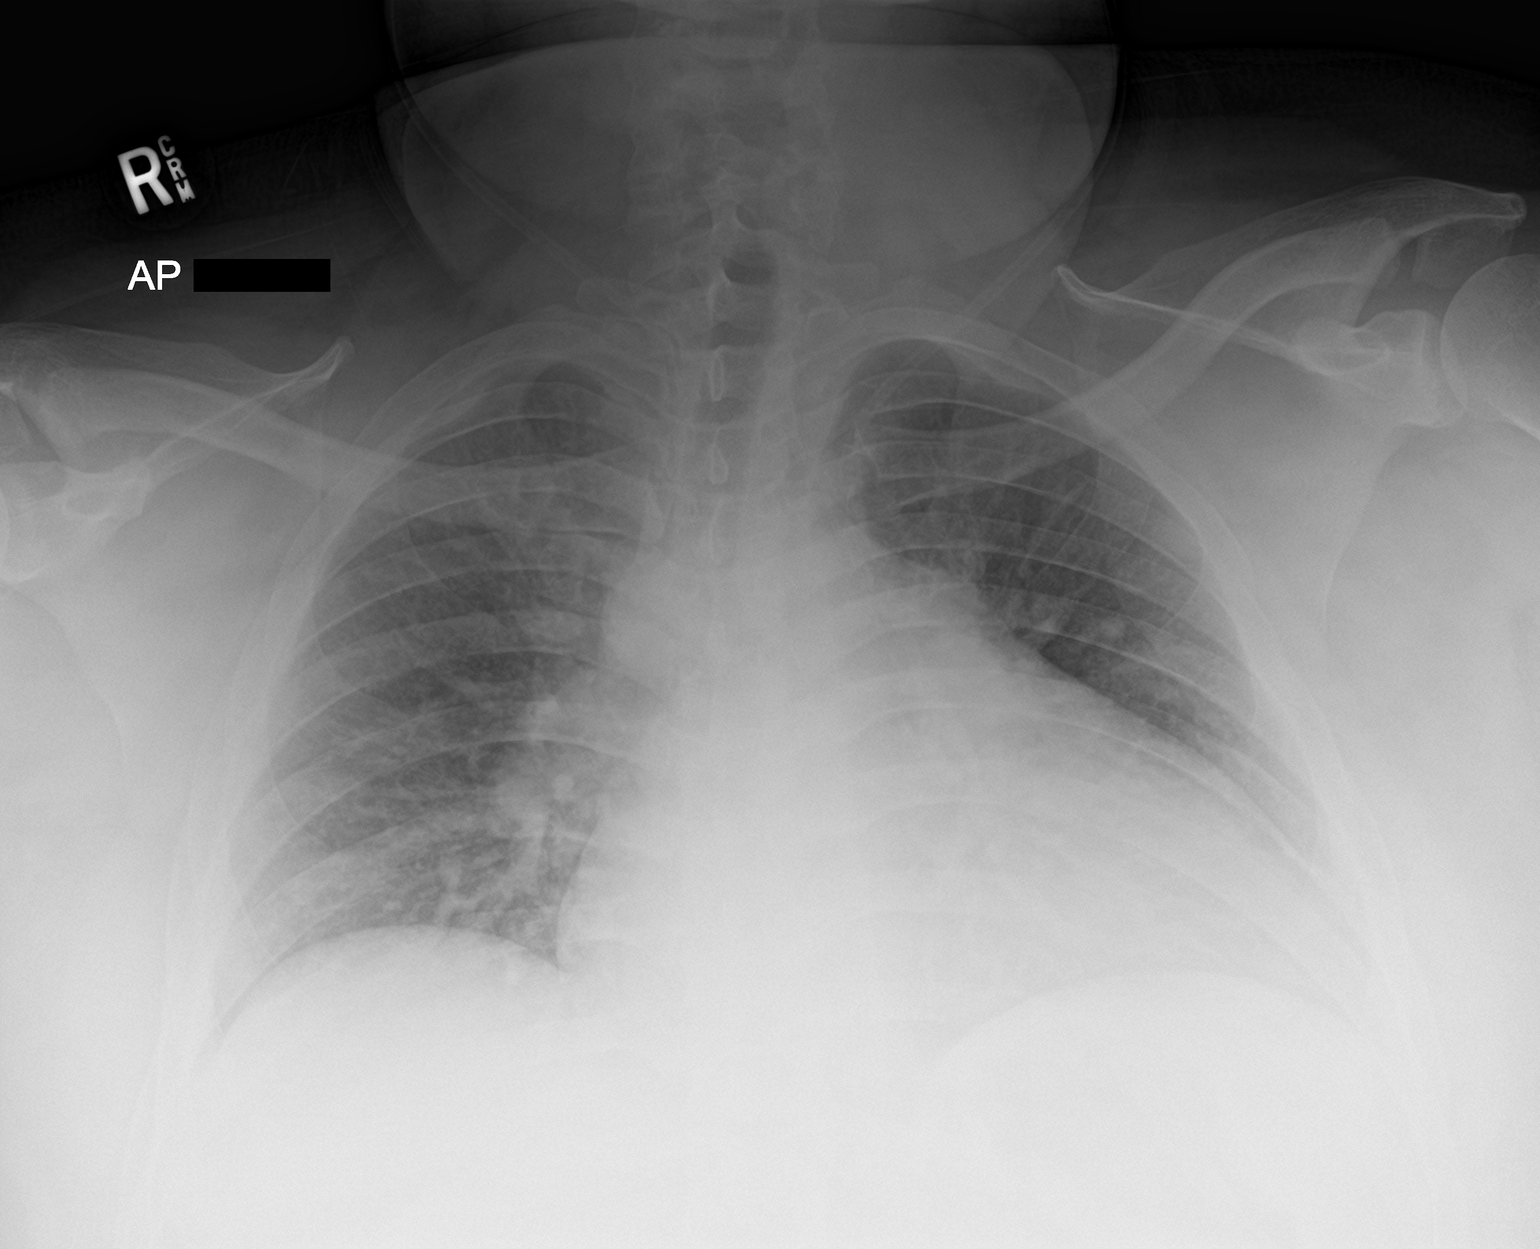

[chest lat]
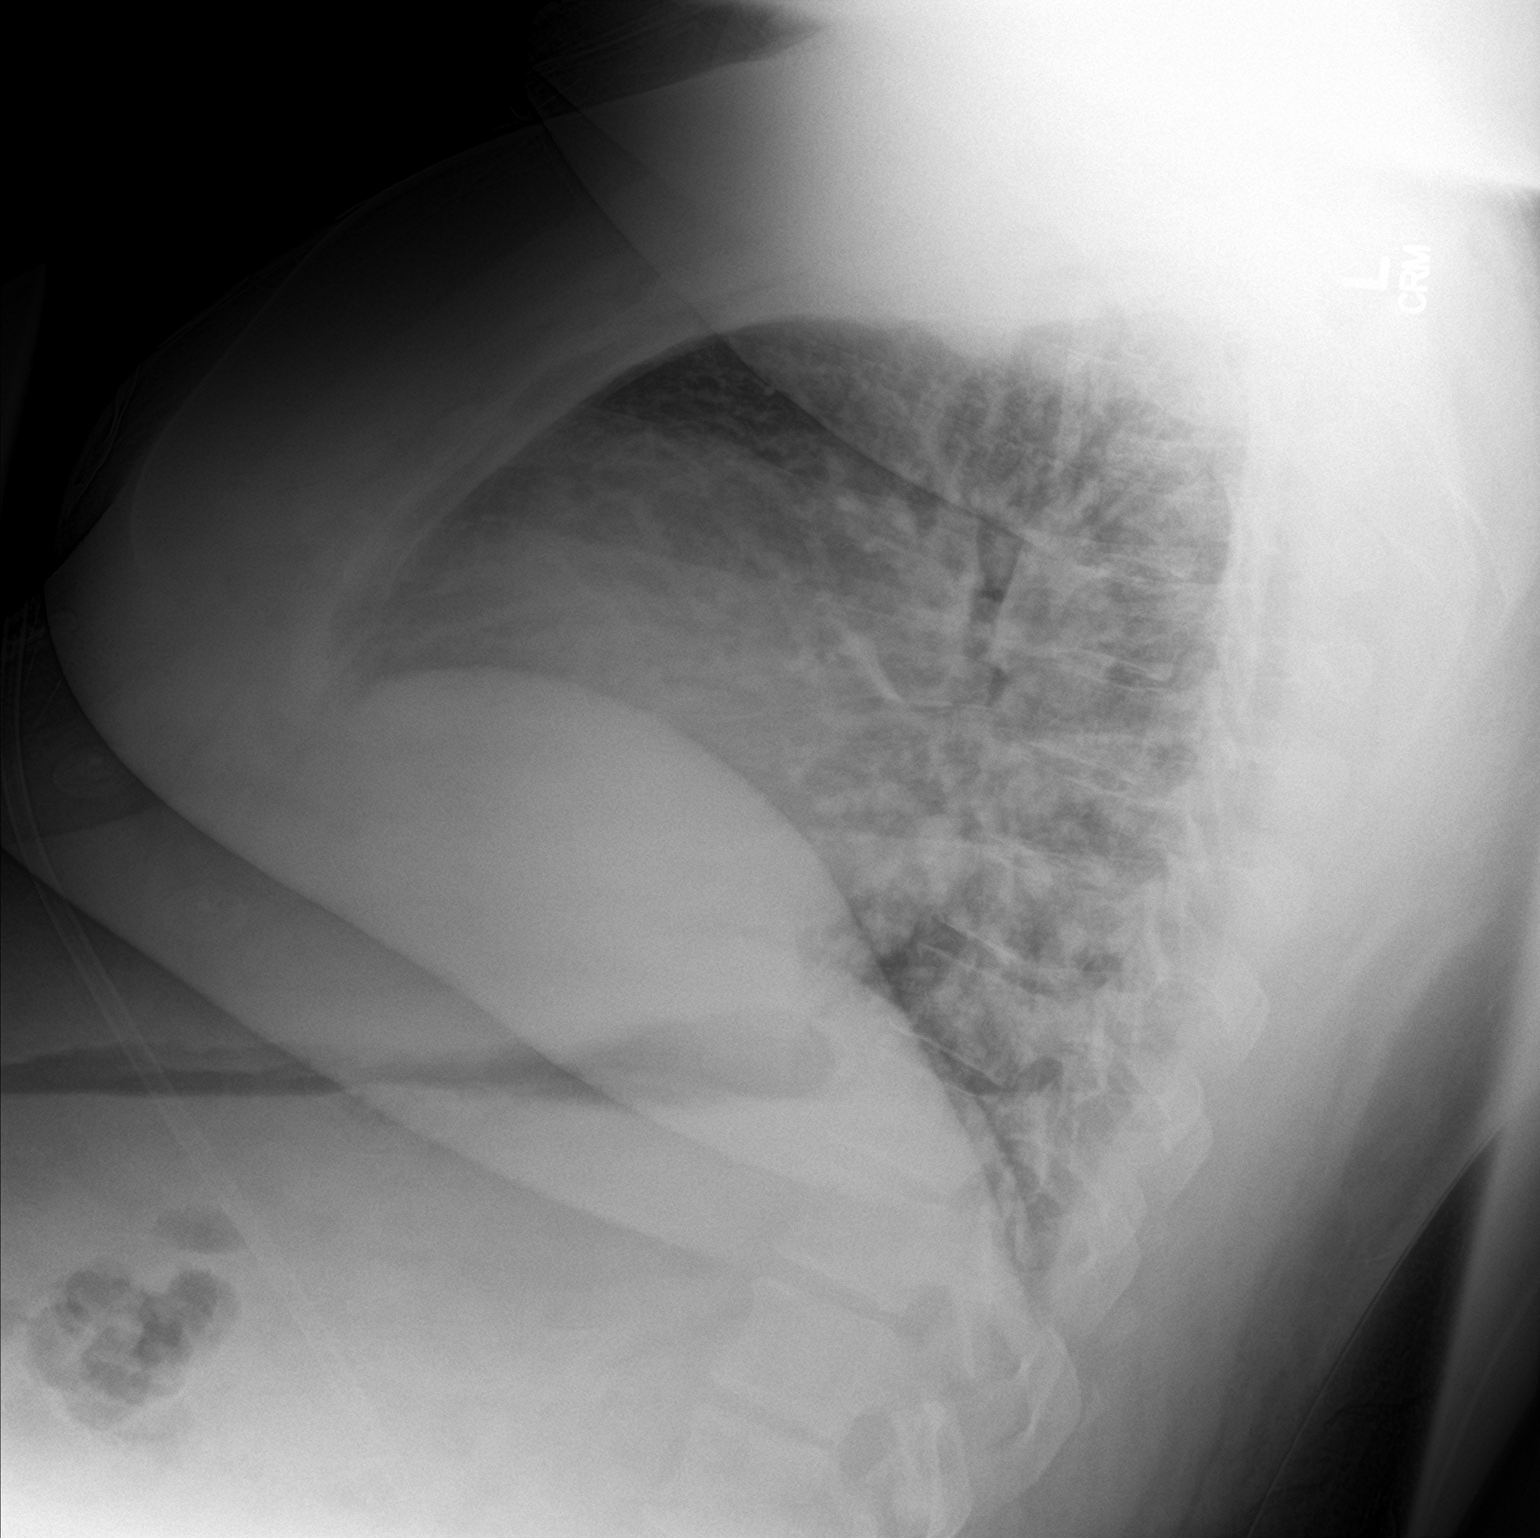

[2 of 2 positions shown; findings below may reference images not displayed]

FINDINGS: Lungs are hypoinflated without lobar consolidation or effusion.
There is mild prominence of the perihilar markings unchanged to
slightly improved. Moderate stable cardiomegaly. Remainder of the
exam is unchanged.
IMPRESSION: Moderate stable cardiomegaly with mild vascular congestion which is
unchanged to slightly improved.

## 2018-01-19 ENCOUNTER — Other Ambulatory Visit: Payer: Self-pay | Admitting: Physician Assistant

## 2018-01-20 NOTE — Telephone Encounter (Signed)
Rx request sent to pharmacy.  

## 2018-01-20 NOTE — Telephone Encounter (Signed)
Roger Ward patient

## 2018-01-20 NOTE — Telephone Encounter (Signed)
Refill Request.  

## 2018-01-22 ENCOUNTER — Ambulatory Visit (INDEPENDENT_AMBULATORY_CARE_PROVIDER_SITE_OTHER): Payer: Managed Care, Other (non HMO) | Admitting: Cardiology

## 2018-01-22 ENCOUNTER — Encounter: Payer: Self-pay | Admitting: Cardiology

## 2018-01-22 VITALS — BP 143/81 | HR 117 | Ht 69.0 in | Wt 371.6 lb

## 2018-01-22 DIAGNOSIS — G4733 Obstructive sleep apnea (adult) (pediatric): Secondary | ICD-10-CM | POA: Diagnosis not present

## 2018-01-22 DIAGNOSIS — I472 Ventricular tachycardia: Secondary | ICD-10-CM

## 2018-01-22 DIAGNOSIS — N183 Chronic kidney disease, stage 3 unspecified: Secondary | ICD-10-CM

## 2018-01-22 DIAGNOSIS — I441 Atrioventricular block, second degree: Secondary | ICD-10-CM | POA: Diagnosis not present

## 2018-01-22 DIAGNOSIS — I5041 Acute combined systolic (congestive) and diastolic (congestive) heart failure: Secondary | ICD-10-CM | POA: Diagnosis not present

## 2018-01-22 DIAGNOSIS — I110 Hypertensive heart disease with heart failure: Secondary | ICD-10-CM | POA: Diagnosis not present

## 2018-01-22 DIAGNOSIS — I429 Cardiomyopathy, unspecified: Secondary | ICD-10-CM | POA: Diagnosis not present

## 2018-01-22 DIAGNOSIS — I4729 Other ventricular tachycardia: Secondary | ICD-10-CM

## 2018-01-22 MED ORDER — CARVEDILOL 12.5 MG PO TABS: 12.5000 mg | ORAL_TABLET | Freq: Two times a day (BID) | ORAL | 6 refills | Status: DC

## 2018-01-22 MED ORDER — SACUBITRIL-VALSARTAN 49-51 MG PO TABS: 1.0000 | ORAL_TABLET | Freq: Two times a day (BID) | ORAL | 6 refills | Status: DC

## 2018-01-22 NOTE — Patient Instructions (Addendum)
Increase Coreg to 12.5 mg twice a day   Stop  Losartan   Start Entresto 49/51 mg twice a day   Your physician recommends that you schedule a follow-up appointment with Dr.Croitoru in 2 to 3 months.

## 2018-01-22 NOTE — Addendum Note (Signed)
Addended by: Neoma Laming on: 01/22/2018 03:20 PM   Modules accepted: Orders

## 2018-01-22 NOTE — Progress Notes (Signed)
01/22/2018 Roger Ward   30-Jan-1981  [id number]  Primary Physician Veryl Speak, FNP Primary Cardiologist: Dr Royann Shivers  HPI:  37 y/o morbidly obese married AA male, works in [employer], with a history of cardiomyopathy (presumed to be NICM), OSA, HTN, and CRI. He was admitted 10/21/17-10/25/17 with acute on chronic systolic CHF secondary to non compliance with medical Rx ($$ issues). He was 50 lbs above his base weight. Echo revealed an EF of 35-40%. He had NSVT runs. He had not been on beta blocker previously secondary to 2nd degree AVB. Low dose Coreg was added in Nov. I saw him in the 11/12/17 today for follow up. He was taking his medications as prescribed. I stopped his ACE and added Losartan in preparation for starting Entresto. He is in th office today for follow up. He say he is doing well, though his wgt is up 10 lbs from his last OV.    Current Outpatient Medications  Medication Sig Dispense Refill  . carvedilol (COREG) 6.25 MG tablet Take 1 tablet (6.25 mg total) by mouth 2 (two) times daily with a meal. 60 tablet 5  . cetirizine (ZYRTEC) 10 MG tablet Take 1 tablet (10 mg total) by mouth daily. 30 tablet 0  . fluticasone (FLONASE) 50 MCG/ACT nasal spray Place 1 spray into both nostrils daily. 1 spray in each nostril every day 16 g 5  . furosemide (LASIX) 80 MG tablet TAKE 1 TABLET (80 MG TOTAL) BY MOUTH 2 (TWO) TIMES DAILY. 60 tablet 2  . isosorbide-hydrALAZINE (BIDIL) 20-37.5 MG tablet Take 1 tablet 3 (three) times daily by mouth. 90 tablet 3  . losartan (COZAAR) 50 MG tablet Take 1 tablet (50 mg total) by mouth daily. 30 tablet 5  . metolazone (ZAROXOLYN) 2.5 MG tablet Take 1 tab (2.5) tab by mouth Tuesday, Thursday and Saturday 30 tablet 3  . spironolactone (ALDACTONE) 50 MG tablet Take 1 tablet (50 mg total) daily by mouth. 30 tablet 3  . sulfacetamide (BLEPH-10) 10 % ophthalmic solution Place 1-2 drops into the left eye every 2 (two) hours while awake. 10 mL 0   No  current facility-administered medications for this visit.     Allergies  Allergen Reactions  . Penicillins Swelling    Past Medical History:  Diagnosis Date  . 2nd degree AV block    a. noted during 02/2015 admission - second degree AV AV block (not further defined in notes). Not on BB due to this.  . Asthma    Childhood  . Bradycardia    a. noted during 02/2015 admission - second degree AV AV block (not further defined in notes). Not on BB due to this.  . Chronic combined systolic and diastolic CHF (congestive heart failure) (HCC)    a. echo 03/06/15 showing mild LVH, EF 40%, high ventricular filling pressures, mod MR, mildly dilated RV/mildly reduced RV function, mildly dilated RA, PASP .  . Diabetes mellitus, type 2 (HCC)   . Essential hypertension   . Family history of adverse reaction to anesthesia    mother and aunt  both put to sleep for surgery and "stopped breathing"  . Morbid obesity (HCC)   . NSVT (nonsustained ventricular tachycardia) (HCC)    a. noted during 02/2015 admission.  . OSA (obstructive sleep apnea)    a. noncompliance with CPAP - wakes up with it off.    Social History   Socioeconomic History  . Marital status: Married    Spouse name: Not on  file  . Number of children: 0  . Years of education: 22  . Highest education level: Not on file  Social Needs  . Financial resource strain: Not on file  . Food insecurity - worry: Not on file  . Food insecurity - inability: Not on file  . Transportation needs - medical: Not on file  . Transportation needs - non-medical: Not on file  Occupational History  . Occupation: Merchandiser, retail at [employer]  . Smoking status: Current Every Day Smoker    Packs/day: 0.50    Years: 19.00    Pack years: 9.50    Types: Cigarettes    Start date: 01/05/1996    Last attempt to quit: 03/04/2015    Years since quitting: 2.8  . Smokeless tobacco: Never Used  . Tobacco comment: Already decreasaed smoking    Substance and Sexual Activity  . Alcohol use: No    Alcohol/week: 0.6 - 1.2 oz    Types: 1 - 2 Standard drinks or equivalent per week    Comment: Occasional  . Drug use: No  . Sexual activity: Yes    Birth control/protection: Condom  Other Topics Concern  . Not on file  Social History Narrative   Born and raised in [place]. Fun: Bowling, movies, we go   Denies religious beliefs that would effect health care.      Family History  Problem Relation Age of Onset  . Hypertension Mother   . Hyperlipidemia Mother   . Heart failure Mother   . Multiple sclerosis Mother   . Breast cancer Maternal Grandmother   . Diabetes Maternal Grandmother   . Colon cancer Maternal Uncle      Review of Systems: General: negative for chills, fever, night sweats or weight changes.  Cardiovascular: negative for chest pain, dyspnea on exertion, edema, orthopnea, palpitations, paroxysmal nocturnal dyspnea or shortness of breath Dermatological: negative for rash Respiratory: negative for cough or wheezing Urologic: negative for hematuria Abdominal: negative for nausea, vomiting, diarrhea, bright red blood per rectum, melena, or hematemesis Neurologic: negative for visual changes, syncope, or dizziness All other systems reviewed and are otherwise negative except as noted above.    Blood pressure (!) 143/81, pulse (!) 117, height 5\' 9"  (1.753 m), weight (!) 371 lb 9.6 oz (168.6 kg).  General appearance: alert, cooperative, no distress, morbidly obese and NAD Lungs: clear to auscultation bilaterally Heart: regular rate and rhythm and increased rate Extremities: 1+ edema Skin: Skin color, texture, turgor normal. No rashes or lesions Neurologic: Grossly normal   ASSESSMENT AND PLAN:    Acute combined systolic and diastolic congestive heart failure (HCC) Admitted Nov 2018- 40 lbs over his dry wgt secondary to non compliance ($$) Diuresed to 359 lbs (from 402 on adm)-wgt today 370  AV block,  2nd degree Noted during 02/2015 admission - second degree AV AV block. He was not on beta blocker till Nov 2018 adm when he had NSVT. Increase Coreg today to 12.5 mg BID  Cardiomyopathy (HCC) EF 35-40% by echo Nov 2018  Hypertensive cardiomegaly with heart failure (HCC) HTN cardiomyopathy with CHF, admitted Nov 2018  Morbid obesity (HCC) BMI 48  NSVT (nonsustained ventricular tachycardia) (HCC) Low dose beta blocker added Nov 2018  OSA (obstructive sleep apnea) On Bi pap  Chronic renal insufficiency, stage III (moderate) (HCC) SCr down to 1.4 at discharge- actually normalized at last check- SCr 0.97 on 01/02/18   PLAN  I suggested we increase his Coreg to 12.5 mg BID,  stop his Losartan and start Entresto 49/51. F/U with Dr Royann Shivers and possibly increase his medications further. Despite his improvement ans his commitment to follow up, I feel he is still at high risk for recurrent CHF. I believe his weight is a significant problem and gastric bypass surgery should be brought up at the next office visit if the pt is doing well.   Corine Shelter PA-C 01/22/2018 2:58 PM

## 2018-02-28 ENCOUNTER — Other Ambulatory Visit: Payer: Self-pay | Admitting: Physician Assistant

## 2018-02-28 NOTE — Telephone Encounter (Signed)
REFILL 

## 2018-02-28 NOTE — Telephone Encounter (Signed)
Please review for refill. Thanks!  

## 2018-03-17 ENCOUNTER — Ambulatory Visit (HOSPITAL_COMMUNITY)
Admission: EM | Admit: 2018-03-17 | Discharge: 2018-03-17 | Disposition: A | Discharge status: Home / Self Care | Payer: Managed Care, Other (non HMO) | Attending: Urgent Care | Admitting: Urgent Care

## 2018-03-17 ENCOUNTER — Other Ambulatory Visit: Payer: Self-pay

## 2018-03-17 ENCOUNTER — Encounter (HOSPITAL_COMMUNITY): Payer: Self-pay

## 2018-03-17 DIAGNOSIS — R531 Weakness: Secondary | ICD-10-CM | POA: Diagnosis not present

## 2018-03-17 DIAGNOSIS — R5383 Other fatigue: Secondary | ICD-10-CM

## 2018-03-17 DIAGNOSIS — Z9989 Dependence on other enabling machines and devices: Secondary | ICD-10-CM

## 2018-03-17 DIAGNOSIS — G4733 Obstructive sleep apnea (adult) (pediatric): Secondary | ICD-10-CM

## 2018-03-17 DIAGNOSIS — R7303 Prediabetes: Secondary | ICD-10-CM

## 2018-03-17 DIAGNOSIS — I10 Essential (primary) hypertension: Secondary | ICD-10-CM

## 2018-03-17 DIAGNOSIS — I509 Heart failure, unspecified: Secondary | ICD-10-CM

## 2018-03-17 LAB — POCT URINALYSIS DIP (DEVICE)
Bilirubin Urine: NEGATIVE
GLUCOSE, UA: NEGATIVE mg/dL
HGB URINE DIPSTICK: NEGATIVE
Ketones, ur: NEGATIVE mg/dL
LEUKOCYTES UA: NEGATIVE
NITRITE: NEGATIVE
Protein, ur: 100 mg/dL — AB
Specific Gravity, Urine: 1.015 (ref 1.005–1.030)
UROBILINOGEN UA: 0.2 mg/dL (ref 0.0–1.0)
pH: 5.5 (ref 5.0–8.0)

## 2018-03-17 LAB — POCT I-STAT, CHEM 8
BUN: 8 mg/dL (ref 6–20)
CHLORIDE: 100 mmol/L — AB (ref 101–111)
Calcium, Ion: 1.12 mmol/L — ABNORMAL LOW (ref 1.15–1.40)
Creatinine, Ser: 1 mg/dL (ref 0.61–1.24)
Glucose, Bld: 88 mg/dL (ref 65–99)
HEMATOCRIT: 51 % (ref 39.0–52.0)
HEMOGLOBIN: 17.3 g/dL — AB (ref 13.0–17.0)
POTASSIUM: 3.9 mmol/L (ref 3.5–5.1)
SODIUM: 140 mmol/L (ref 135–145)
TCO2: 28 mmol/L (ref 22–32)

## 2018-03-17 NOTE — ED Provider Notes (Signed)
MRN: [id number] DOB: 08-01-1981  Subjective:   Roger Ward is a 37 y.o. male presenting for acute onset of weakness/fatigue this morning. He has not taken his medications today. He also missed his doses last night. Smokes 1/2ppd. Denies alcohol use. Denies headache, confusion, dizziness, sore throat, cough, body aches, chest pain, shob, n/v, abdominal pain, wheezing, dysuria, hematuria. Has a history of HTN, CHF, heart block, OSA, DM.   No current facility-administered medications for this encounter.   Current Outpatient Medications:  .  carvedilol (COREG) 12.5 MG tablet, Take 1 tablet (12.5 mg total) by mouth 2 (two) times daily., Disp: 60 tablet, Rfl: 6 .  cetirizine (ZYRTEC) 10 MG tablet, Take 1 tablet (10 mg total) by mouth daily., Disp: 30 tablet, Rfl: 0 .  furosemide (LASIX) 80 MG tablet, TAKE 1 TABLET BY MOUTH TWICE A DAY, Disp: 180 tablet, Rfl: 1 .  isosorbide-hydrALAZINE (BIDIL) 20-37.5 MG tablet, Take 1 tablet 3 (three) times daily by mouth., Disp: 90 tablet, Rfl: 3 .  metolazone (ZAROXOLYN) 2.5 MG tablet, Take 1 tab (2.5) tab by mouth Tuesday, Thursday and Saturday, Disp: 30 tablet, Rfl: 3 .  sacubitril-valsartan (ENTRESTO) 49-51 MG, Take 1 tablet by mouth 2 (two) times daily., Disp: 60 tablet, Rfl: 6 .  sulfacetamide (BLEPH-10) 10 % ophthalmic solution, Place 1-2 drops into the left eye every 2 (two) hours while awake., Disp: 10 mL, Rfl: 0 .  fluticasone (FLONASE) 50 MCG/ACT nasal spray, Place 1 spray into both nostrils daily. 1 spray in each nostril every day, Disp: 16 g, Rfl: 5 .  spironolactone (ALDACTONE) 50 MG tablet, Take 1 tablet (50 mg total) daily by mouth., Disp: 30 tablet, Rfl: 3   Allergies  Allergen Reactions  . Penicillins Swelling    Past Medical History:  Diagnosis Date  . 2nd degree AV block    a. noted during 02/2015 admission - second degree AV AV block (not further defined in notes). Not on BB due to this.  . Asthma    Childhood  . Bradycardia    a.  noted during 02/2015 admission - second degree AV AV block (not further defined in notes). Not on BB due to this.  . Chronic combined systolic and diastolic CHF (congestive heart failure) (HCC)    a. echo 03/06/15 showing mild LVH, EF 40%, high ventricular filling pressures, mod MR, mildly dilated RV/mildly reduced RV function, mildly dilated RA, PASP .  . Diabetes mellitus, type 2 (HCC)   . Essential hypertension   . Family history of adverse reaction to anesthesia    mother and aunt  both put to sleep for surgery and "stopped breathing"  . Morbid obesity (HCC)   . NSVT (nonsustained ventricular tachycardia) (HCC)    a. noted during 02/2015 admission.  . OSA (obstructive sleep apnea)    a. noncompliance with CPAP - wakes up with it off.     Past Surgical History:  Procedure Laterality Date  . NO PAST SURGERIES      Objective:   Vitals: BP (!) 146/94   Pulse 93   Temp 99.1 F (37.3 C)   Resp 18   Ht 6\' 1"  (1.854 m)   Wt (!) 365 lb (165.6 kg)   SpO2 95%   BMI 48.16 kg/m   Physical Exam  Constitutional: He is oriented to person, place, and time. He appears well-developed and well-nourished.  HENT:  Mouth/Throat: Oropharynx is clear and moist.  Eyes: Pupils are equal, round, and reactive to light. EOM are  normal. No scleral icterus.  Cardiovascular: Normal rate, regular rhythm and intact distal pulses. Exam reveals no gallop and no friction rub.  No murmur heard. Pulmonary/Chest: No respiratory distress. He has no wheezes. He has no rales.  Neurological: He is alert and oriented to person, place, and time. No cranial nerve deficit.  Skin: Skin is warm and dry.  Psychiatric: He has a normal mood and affect.   Results for orders placed or performed during the hospital encounter of 03/17/18 (from the past 24 hour(s))  POCT urinalysis dip (device)     Status: Abnormal   Collection Time: 03/17/18  3:14 PM  Result Value Ref Range   Glucose, UA NEGATIVE NEGATIVE mg/dL    Bilirubin Urine NEGATIVE NEGATIVE   Ketones, ur NEGATIVE NEGATIVE mg/dL   Specific Gravity, Urine 1.015 1.005 - 1.030   Hgb urine dipstick NEGATIVE NEGATIVE   pH 5.5 5.0 - 8.0   Protein, ur 100 (A) NEGATIVE mg/dL   Urobilinogen, UA 0.2 0.0 - 1.0 mg/dL   Nitrite NEGATIVE NEGATIVE   Leukocytes, UA NEGATIVE NEGATIVE  I-STAT, chem 8     Status: Abnormal   Collection Time: 03/17/18  3:17 PM  Result Value Ref Range   Sodium 140 135 - 145 mmol/L   Potassium 3.9 3.5 - 5.1 mmol/L   Chloride 100 (L) 101 - 111 mmol/L   BUN 8 6 - 20 mg/dL   Creatinine, Ser 1.61 0.61 - 1.24 mg/dL   Glucose, Bld 88 65 - 99 mg/dL   Calcium, Ion 0.96 (L) 1.15 - 1.40 mmol/L   TCO2 28 22 - 32 mmol/L   Hemoglobin 17.3 (H) 13.0 - 17.0 g/dL   HCT 04.5 40.9 - 81.1 %    Assessment and Plan :   Weakness  Other fatigue  Essential hypertension  Pre-diabetes  Morbid obesity (HCC)  Chronic congestive heart failure, unspecified heart failure type (HCC)  OSA on CPAP  Counseled patient on importance of medical and dietary compliance. Labs and physical exam findings reassuring but counseled that I did not check his bnp level. ER and return-to-clinic precautions discussed, patient verbalized understanding.    Wallis Bamberg, PA-C 03/17/18 1524

## 2018-03-17 NOTE — ED Triage Notes (Signed)
Pt presents with complaints of generalized weakness that he woke up with this am. Denies any other symptoms.

## 2018-04-28 ENCOUNTER — Encounter: Payer: Self-pay | Admitting: Cardiovascular Disease

## 2018-04-28 ENCOUNTER — Ambulatory Visit: Payer: Managed Care, Other (non HMO) | Admitting: Cardiovascular Disease

## 2018-04-28 VITALS — BP 172/110 | HR 103 | Ht 73.0 in | Wt 365.0 lb

## 2018-04-28 DIAGNOSIS — I5042 Chronic combined systolic (congestive) and diastolic (congestive) heart failure: Secondary | ICD-10-CM

## 2018-04-28 DIAGNOSIS — G4733 Obstructive sleep apnea (adult) (pediatric): Secondary | ICD-10-CM | POA: Diagnosis not present

## 2018-04-28 DIAGNOSIS — I441 Atrioventricular block, second degree: Secondary | ICD-10-CM

## 2018-04-28 DIAGNOSIS — I10 Essential (primary) hypertension: Secondary | ICD-10-CM

## 2018-04-28 DIAGNOSIS — I110 Hypertensive heart disease with heart failure: Secondary | ICD-10-CM

## 2018-04-28 MED ORDER — SACUBITRIL-VALSARTAN 97-103 MG PO TABS: 1.0000 | ORAL_TABLET | Freq: Two times a day (BID) | ORAL | 3 refills | Status: DC

## 2018-04-28 MED ORDER — SACUBITRIL-VALSARTAN 97-103 MG PO TABS: 1.0000 | ORAL_TABLET | Freq: Two times a day (BID) | ORAL | 0 refills | Status: DC

## 2018-04-28 NOTE — Patient Instructions (Signed)
Dr Royann Shivers has recommended making the following medication changes: 1. INCREASE Entresto to 97-103 mg twice daily  Your physician recommends that you schedule a follow-up appointment in 1 month with Corine Shelter, PA.  Dr Royann Shivers recommends that you schedule a follow-up appointment in 6 months. You will receive a reminder letter in the mail two months in advance. If you don't receive a letter, please call our office to schedule the follow-up appointment.  If you need a refill on your cardiac medications before your next appointment, please call your pharmacy.

## 2018-04-28 NOTE — Progress Notes (Signed)
Patient ID: Roger Ward, male   DOB: February 13, 1981, 37 y.o.   MRN: [id number] Patient ID: Roger Ward, male   DOB: 06/01/1981, 37 y.o.   MRN: [id number]    Cardiology Office Note    Date:  04/28/2018   ID:  Roger Ward, DOB 28-May-1981, MRN [id number]  PCP:  Veryl Speak, FNP  Cardiologist:   Thurmon Fair, MD   No chief complaint on file.   History of Present Illness:  Roger Ward is a 37 y.o. 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   Some additional weight since I last saw him, improvement even though he remains in the morbid obesity range.  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  Times has to lift 20-30 pounds, but can do so without dyspnea, chest pain or syncope.  He has mild lower extremity edema.  He feels that his energy level is much better since he started wearing CPAP.  At his last clinic appointment (on February 6) his blood pressure was almost at the desirable range at 143/81, but today's blood pressure is quite high.  It was still 165/100 when I rechecked it 10 minutes later.  He reports forgetting to take his hydralazine/nitrate, but did take his carvedilol and Entresto.   Past Medical History:  Diagnosis Date  . 2nd degree AV block    a. noted during 02/2015 admission - second degree AV AV block (not further defined in notes). Not on BB due to this.  . Asthma    Childhood  . Bradycardia    a. noted during 02/2015 admission - second degree AV AV block (not further defined in notes). Not on BB due to this.  . Chronic combined systolic and diastolic CHF (congestive heart failure) (HCC)    a. echo 03/06/15 showing mild LVH, EF 40%, high ventricular filling pressures, mod MR, mildly dilated RV/mildly  reduced RV function, mildly dilated RA, PASP .  . Diabetes mellitus, type 2 (HCC)   . Essential hypertension   . Family history of adverse reaction to anesthesia    mother and aunt  both put to sleep for surgery and "stopped breathing"  . Morbid obesity (HCC)   . NSVT (nonsustained ventricular tachycardia) (HCC)    a. noted during 02/2015 admission.  . OSA (obstructive sleep apnea)    a. noncompliance with CPAP - wakes up with it off.    Past Surgical History:  Procedure Laterality Date  . NO PAST SURGERIES      Outpatient Medications Prior to Visit  Medication Sig Dispense Refill  . cetirizine (ZYRTEC) 10 MG tablet Take 1 tablet (10 mg total) by mouth daily. 30 tablet 0  . fluticasone (FLONASE) 50 MCG/ACT nasal spray Place 1 spray into both nostrils daily. 1 spray in each nostril every day 16 g 5  . furosemide (LASIX) 80 MG tablet TAKE 1 TABLET BY MOUTH TWICE A DAY 180 tablet 1  . isosorbide-hydrALAZINE (BIDIL) 20-37.5 MG tablet Take 1 tablet 3 (three) times daily by mouth. 90 tablet 3  . metolazone (ZAROXOLYN) 2.5 MG tablet Take 1 tab (2.5) tab by mouth Tuesday, Thursday and Saturday 30 tablet 3  . sulfacetamide (BLEPH-10) 10 % ophthalmic solution Place 1-2 drops into the left eye every 2 (two) hours while  awake. 10 mL 0  . sacubitril-valsartan (ENTRESTO) 49-51 MG Take 1 tablet by mouth 2 (two) times daily. 60 tablet 6  . carvedilol (COREG) 12.5 MG tablet Take 1 tablet (12.5 mg total) by mouth 2 (two) times daily. 60 tablet 6  . spironolactone (ALDACTONE) 50 MG tablet Take 1 tablet (50 mg total) daily by mouth. 30 tablet 3   No facility-administered medications prior to visit.      Allergies:   Penicillins   Social History   Socioeconomic History  . Marital status: Married    Spouse name: Not on file  . Number of children: 0  . Years of education: 61  . Highest education level: Not on file  Occupational History  . 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 Financial resource strain: Not on file  . Food insecurity:    Worry: Not on file    Inability: Not on file  . Transportation needs:    Medical: Not on file    Non-medical: Not on file  Tobacco Use  . Smoking status: Current Every Day Smoker    Packs/day: 0.50    Years: 19.00    Pack years: 9.50    Types: Cigarettes    Start date: 01/05/1996    Last attempt to quit: 03/04/2015    Years since quitting: 3.1  . Smokeless tobacco: Never Used  . Tobacco comment: Already decreasaed smoking  Substance and Sexual Activity  . Alcohol use: No    Alcohol/week: 0.6 - 1.2 oz    Types: 1 - 2 Standard drinks or equivalent per week    Comment: Occasional  . Drug use: No  . Sexual activity: Yes    Birth control/protection: Condom  Lifestyle  . Physical activity:    Days per week: Not on file    Minutes per session: Not on file  . Stress: Not on file  Relationships  . Social connections:    Talks on phone: Not on file    Gets together: Not on file    Attends religious service: Not on file    Active member of club or organization: Not on file    Attends meetings of clubs or organizations: Not on file    Relationship status: Not on file  Other Topics Concern  . Not on file  Social History Narrative   Born and raised in [place]. Fun: Bowling, movies, we go   Denies religious beliefs that would effect health care.      Family History:  The patient's family history includes Breast cancer in his maternal grandmother; Colon cancer in his maternal uncle; Diabetes in his maternal grandmother; Heart failure in his mother; Hyperlipidemia in his mother; Hypertension in his mother; Multiple sclerosis in his mother.   ROS:   Please see the history of present illness.    ROS All other systems reviewed and are negative.   PHYSICAL EXAM:   VS:  BP (!) 172/110   Pulse (!) 103   Ht 6\' 1"  (1.854 m)   Wt (!) 365 lb (165.6 kg)   BMI 48.16 kg/m    Recheck BP 165/100 General:  Alert, oriented x3, no distress, morbidly obese (which seriously limits his physical exam) Head: no evidence of trauma, PERRL, EOMI, no exophtalmos or lid lag, no myxedema, no xanthelasma; normal ears, nose and oropharynx Neck: normal jugular venous pulsations and no hepatojugular reflux; brisk carotid pulses without delay and no carotid bruits Chest: clear to auscultation, no signs  of consolidation by percussion or palpation, normal fremitus, symmetrical and full respiratory excursions Cardiovascular: normal position and quality of the apical impulse, regular rhythm, normal first and second heart sounds, no murmurs, rubs or gallops Abdomen: no tenderness or distention, no masses by palpation, no abnormal pulsatility or arterial bruits, normal bowel sounds, no hepatosplenomegaly Extremities: no clubbing, cyanosis or edema; 2+ radial, ulnar and brachial pulses bilaterally; 2+ right femoral, posterior tibial and dorsalis pedis pulses; 2+ left femoral, posterior tibial and dorsalis pedis pulses; no subclavian or femoral bruits Neurological: grossly nonfocal Psych: Normal mood and affect   Wt Readings from Last 3 Encounters:  04/28/18 (!) 365 lb (165.6 kg)  03/17/18 (!) 365 lb (165.6 kg)  01/22/18 (!) 371 lb 9.6 oz (168.6 kg)      Studies/Labs Reviewed:   EKG:  EKG is ordered today.  Shows sinus tachycardia 103 bpm, left axis deviation, right bundle branch block.  QRS 156 ms, QTC 529 ms  Recent Labs: 10/21/2017: B Natriuretic Peptide 231.7 10/22/2017: TSH 0.789 10/27/2017: Magnesium 2.3 10/30/2017: Platelets 301 03/17/2018: BUN 8; Creatinine, Ser 1.00; Hemoglobin 17.3; Potassium 3.9; Sodium 140   Lipid Panel    Component Value Date/Time   CHOL 166 03/06/2015 0155   TRIG 248 (H) 03/06/2015 0155   HDL 42 03/06/2015 0155   CHOLHDL 4.0 03/06/2015 0155   VLDL 50 (H) 03/06/2015 0155   LDLCALC 74 03/06/2015 0155    ASSESSMENT:    1. Chronic combined systolic and diastolic congestive heart  failure (HCC)   2. 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 Malignant hypertension   4. Second degree AV block, Mobitz type I   5. OSA (obstructive sleep apnea)   6. Morbid obesity (HCC)      PLAN:  In order of problems listed above:  1. CHF: Volume assessment is difficult due to obesity, but I guess he is euvolemic.  NYHA functional class II.  Biventricular disease, but right heart failure symptoms have always dominated.  Increase Entresto today. 2. HTNive heart disease.  Despite improved blood pressure control, his last echo still showed decreased ejection fraction of 40%.  I wonder if this is still because of inconsistent reduction in blood pressure.  Still hope we can improve LVEF with aggressive treatment of hypertension. 3. Malignant hypertension: We will increase Entresto.  Bring him back in several weeks.  Increase carvedilol to 25 mg twice daily if his blood pressure is still greater than 130/80 4. Second-degree AV block, Mobitz type I : This was observed when he was not receiving sleep apnea therapy.  He has not had any significant bradycardia recently.  Okay to increase beta-blockers if necessary. 5. OSA: now on CPAP, encourage continued compliance 100% of the time 6. Morbid obesity:  in the long run, aggressive weight loss will be very important for health and longevity.  Congratulated on weight loss, still a long way to go to target weight   Medication Adjustments/Labs and Tests Ordered: Current medicines are reviewed at length with the patient today.  Concerns regarding medicines are outlined above.  Medication changes, Labs and Tests ordered today are listed in the Patient Instructions below. Patient Instructions  Dr Royann Shivers has recommended making the following medication changes: 1. INCREASE Entresto to 97-103 mg twice daily  Your physician recommends that you schedule a follow-up appointment in 1 month with  Corine Shelter, PA.  Dr Royann Shivers recommends that you schedule a follow-up appointment in 6 months.  You will receive a reminder letter in the mail two months in advance. If you don't receive a letter, please call our office to schedule the follow-up appointment.  If you need a refill on your cardiac medications before your next appointment, please call your pharmacy.  Signed, Thurmon Fair, MD  04/28/2018 5:55 PM    [facility] [address] [place], [place], [place]  [address] Phone: [phone]; Fax: [phone]

## 2018-06-03 ENCOUNTER — Encounter: Payer: Self-pay | Admitting: Cardiology

## 2018-06-03 ENCOUNTER — Ambulatory Visit (INDEPENDENT_AMBULATORY_CARE_PROVIDER_SITE_OTHER): Payer: Managed Care, Other (non HMO) | Admitting: Cardiology

## 2018-06-03 VITALS — BP 140/92 | HR 100 | Ht 73.0 in | Wt 350.0 lb

## 2018-06-03 DIAGNOSIS — I420 Dilated cardiomyopathy: Secondary | ICD-10-CM

## 2018-06-03 DIAGNOSIS — G4733 Obstructive sleep apnea (adult) (pediatric): Secondary | ICD-10-CM

## 2018-06-03 DIAGNOSIS — I110 Hypertensive heart disease with heart failure: Secondary | ICD-10-CM | POA: Diagnosis not present

## 2018-06-03 DIAGNOSIS — N183 Chronic kidney disease, stage 3 unspecified: Secondary | ICD-10-CM

## 2018-06-03 NOTE — Progress Notes (Addendum)
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 He was admitted 10/21/17-10/25/17 with acute on chronic systolic CHF secondary to non compliance with medical Rx ($$ issues). He was 50 lbs above his base weight. Echo revealed an EF of 35-40%. He had NSVT runs. He had not been on beta blocker previously secondary to 2nd degree AVB. Low dose Coreg was added in Nov and later up titrated as an OP. Sherryll Burger was added as an OP and recently up titrated. He is in the office for follow up. He feels good, no unusual dyspnea. His weight continues to fall- down 15 lbs from May 13 th OV. His B/P by me was 122/82 with a large cuff.    Current Outpatient Medications  Medication Sig Dispense Refill  . cetirizine (ZYRTEC) 10 MG tablet Take 1 tablet (10 mg total) by mouth daily. 30 tablet 0  . fluticasone (FLONASE) 50 MCG/ACT nasal spray Place 1 spray into both nostrils daily. 1 spray in each nostril every day 16 g 5  . furosemide (LASIX) 80 MG tablet TAKE 1 TABLET BY MOUTH TWICE A DAY 180 tablet 1  . isosorbide-hydrALAZINE (BIDIL) 20-37.5 MG tablet Take 1 tablet 3 (three) times daily by mouth. 90 tablet 3  . metolazone (ZAROXOLYN) 2.5 MG tablet Take 1 tab (2.5) tab by mouth Tuesday, Thursday and Saturday 30 tablet 3  . sacubitril-valsartan (ENTRESTO) 97-103 MG Take 1 tablet by mouth 2 (two) times daily. 180 tablet 3  . sulfacetamide (BLEPH-10) 10 % ophthalmic solution Place 1-2 drops into the left eye every 2 (two) hours while awake. 10 mL 0  . carvedilol (COREG) 12.5 MG tablet Take 1 tablet (12.5 mg total) by mouth 2 (two) times daily. 60 tablet 6  . spironolactone (ALDACTONE) 50 MG tablet Take 1 tablet (50 mg total) daily by mouth. 30 tablet 3   No current facility-administered medications  for this visit.     Allergies  Allergen Reactions  . Penicillins Swelling    Past Medical History:  Diagnosis Date  . 2nd degree AV block    a. noted during 02/2015 admission - second degree AV AV block (not further defined in notes). Not on BB due to this.  . Asthma    Childhood  . Bradycardia    a. noted during 02/2015 admission - second degree AV AV block (not further defined in notes). Not on BB due to this.  . Chronic combined systolic and diastolic CHF (congestive heart failure) (HCC)    a. echo 03/06/15 showing mild LVH, EF 40%, high ventricular filling pressures, mod MR, mildly dilated RV/mildly reduced RV function, mildly dilated RA, PASP .  . Diabetes mellitus, type 2 (HCC)   . Essential hypertension   . Family history of adverse reaction to anesthesia    mother and aunt  both put to sleep for surgery and "stopped breathing"  . Morbid obesity (HCC)   . NSVT (nonsustained ventricular tachycardia) (HCC)    a. noted during 02/2015 admission.  . OSA (obstructive sleep apnea)    a. noncompliance with CPAP - wakes up with it off.    Social History   Socioeconomic History  . Marital status: Married    Spouse name: Not on file  . Number of children: 0  .  Years of education: 4  . Highest education level: Not on file  Occupational History  . Occupation: Merchandiser, retail at [employer]  . Financial resource strain: Not on file  . Food insecurity:    Worry: Not on file    Inability: Not on file  . Transportation needs:    Medical: Not on file    Non-medical: Not on file  Tobacco Use  . Smoking status: Current Every Day Smoker    Packs/day: 0.50    Years: 19.00    Pack years: 9.50    Types: Cigarettes    Start date: 01/05/1996    Last attempt to quit: 03/04/2015    Years since quitting: 3.2  . Smokeless tobacco: Never Used  . Tobacco comment: Already decreasaed smoking  Substance and Sexual Activity  . Alcohol use: No    Alcohol/week: 0.6 -  1.2 oz    Types: 1 - 2 Standard drinks or equivalent per week    Comment: Occasional  . Drug use: No  . Sexual activity: Yes    Birth control/protection: Condom  Lifestyle  . Physical activity:    Days per week: Not on file    Minutes per session: Not on file  . Stress: Not on file  Relationships  . Social connections:    Talks on phone: Not on file    Gets together: Not on file    Attends religious service: Not on file    Active member of club or organization: Not on file    Attends meetings of clubs or organizations: Not on file    Relationship status: Not on file  . Intimate partner violence:    Fear of current or ex partner: Not on file    Emotionally abused: Not on file    Physically abused: Not on file    Forced sexual activity: Not on file  Other Topics Concern  . Not on file  Social History Narrative   Born and raised in [place]. Fun: Bowling, movies, we go   Denies religious beliefs that would effect health care.      Family History  Problem Relation Age of Onset  . Hypertension Mother   . Hyperlipidemia Mother   . Heart failure Mother   . Multiple sclerosis Mother   . Breast cancer Maternal Grandmother   . Diabetes Maternal Grandmother   . Colon cancer Maternal Uncle      Review of Systems: General: negative for chills, fever, night sweats or weight changes.  Cardiovascular: negative for chest pain, dyspnea on exertion, edema, orthopnea, palpitations, paroxysmal nocturnal dyspnea or shortness of breath Dermatological: negative for rash Respiratory: negative for cough or wheezing Urologic: negative for hematuria Abdominal: negative for nausea, vomiting, diarrhea, bright red blood per rectum, melena, or hematemesis Neurologic: negative for visual changes, syncope, or dizziness All other systems reviewed and are otherwise negative except as noted above.    Blood pressure (!) 140/92, pulse 100, height 6\' 1"  (1.854 m), weight (!) 350 lb (158.8 kg).    General appearance: alert, cooperative, no distress and morbidly obese Lungs: clear to auscultation bilaterally Heart: regular rate and rhythm Extremities: extremities normal, atraumatic, no cyanosis or edema Skin: Skin color, texture, turgor normal. No rashes or lesions Neurologic: Grossly normal   ASSESSMENT AND PLAN:   Cardiomyopathy (HCC) EF 35-40% by echo Nov 2018  Hypertensive cardiomegaly with heart failure (HCC) HTN cardiomyopathy with CHF, admitted Nov 2018 Repeat B/P by me with large cuff 122/82  Morbid  obesity (HCC) BMI 48  NSVT (nonsustained ventricular tachycardia) (HCC) Low dose beta blocker added Nov 2018, up titrated as an OP  OSA (obstructive sleep apnea) On Bi pap-compliant   PLAN  Same Rx. Check BMP. If stable f/u in 6 months.   Corine Shelter PA-C 06/03/2018 3:45 PM

## 2018-06-03 NOTE — Patient Instructions (Addendum)
Medication Instructions:  Your physician recommends that you continue on your current medications as directed. Please refer to the Current Medication list given to you today.  Labwork: Your physician recommends that you return for lab work in: TODAY-BMET  Testing/Procedures: NONE  Follow-Up: Your physician wants you to follow-up in: 6 MONTHS with DR CROITORU. You will receive a reminder letter in the mail two months in advance. If you don't receive a letter, please call our office to schedule the follow-up appointment.  Any Other Special Instructions Will Be Listed Below (If Applicable).  If you need a refill on your cardiac medications before your next appointment, please call your pharmacy.

## 2018-06-04 LAB — BASIC METABOLIC PANEL
BUN/Creatinine Ratio: 7 — ABNORMAL LOW (ref 9–20)
BUN: 7 mg/dL (ref 6–20)
CO2: 26 mmol/L (ref 20–29)
Calcium: 10.2 mg/dL (ref 8.7–10.2)
Chloride: 97 mmol/L (ref 96–106)
Creatinine, Ser: 1.01 mg/dL (ref 0.76–1.27)
GFR calc Af Amer: 110 mL/min/{1.73_m2} (ref >59)
GFR calc non Af Amer: 95 mL/min/{1.73_m2} (ref >59)
Glucose: 91 mg/dL (ref 65–99)
Potassium: 4.3 mmol/L (ref 3.5–5.2)
Sodium: 141 mmol/L (ref 134–144)

## 2018-06-05 NOTE — Progress Notes (Signed)
Thanks MCr 

## 2018-09-20 ENCOUNTER — Other Ambulatory Visit: Payer: Self-pay | Admitting: Physician Assistant

## 2018-11-11 ENCOUNTER — Telehealth: Payer: Self-pay | Admitting: Cardiovascular Disease

## 2018-11-11 MED ORDER — ISOSORB DINITRATE-HYDRALAZINE 20-37.5 MG PO TABS: 1.0000 | ORAL_TABLET | Freq: Three times a day (TID) | ORAL | 3 refills | Status: DC

## 2018-11-11 NOTE — Telephone Encounter (Incomplete)
New Message          *STAT* If patient is at the pharmacy, call can be transferred to refill team.   1. Which medications need to be refilled? (please list name of each medication and dose if known) [place]  [facility] [phone] [place], [place] - [address] [phone] (Phone) [phone] (Fax)    2. Which pharmacy/location (including street and city if local pharmacy)  [facility] [phone] [place], [place] - [address] [phone] (Phone) [phone] (Fax)       3. Do they need a 30 day or 90 day supply? 30

## 2018-11-11 NOTE — Telephone Encounter (Signed)
rx sent to pharmacy

## 2018-12-15 ENCOUNTER — Encounter (HOSPITAL_COMMUNITY): Payer: Self-pay | Admitting: Emergency Medicine

## 2018-12-15 ENCOUNTER — Other Ambulatory Visit: Payer: Self-pay

## 2018-12-15 ENCOUNTER — Ambulatory Visit (HOSPITAL_COMMUNITY)
Admission: EM | Admit: 2018-12-15 | Discharge: 2018-12-15 | Disposition: A | Discharge status: Home / Self Care | Payer: Managed Care, Other (non HMO) | Attending: Family Medicine | Admitting: Family Medicine

## 2018-12-15 DIAGNOSIS — R062 Wheezing: Secondary | ICD-10-CM | POA: Insufficient documentation

## 2018-12-15 DIAGNOSIS — B9789 Other viral agents as the cause of diseases classified elsewhere: Secondary | ICD-10-CM | POA: Diagnosis not present

## 2018-12-15 DIAGNOSIS — J069 Acute upper respiratory infection, unspecified: Secondary | ICD-10-CM | POA: Insufficient documentation

## 2018-12-15 MED ORDER — BENZONATATE 200 MG PO CAPS: 200.0000 mg | ORAL_CAPSULE | Freq: Two times a day (BID) | ORAL | 0 refills | Status: DC | PRN

## 2018-12-15 MED ORDER — FLUTICASONE PROPIONATE 50 MCG/ACT NA SUSP: 1.0000 | Freq: Every day | NASAL | 0 refills | Status: DC

## 2018-12-15 MED ORDER — PREDNISONE 20 MG PO TABS: 20.0000 mg | ORAL_TABLET | Freq: Two times a day (BID) | ORAL | 0 refills | Status: DC

## 2018-12-15 NOTE — ED Provider Notes (Signed)
[facility]    CSN: [id number] Arrival date & time: 12/15/18  4098     History   Chief Complaint Chief Complaint  Patient presents with  . Cough    HPI Roger Ward is a 37 y.o. male.   HPI  Pleasant 37 year old gentleman with multiple medical problems.  He has heart disease hypertension.  On multiple medications.  Compliant with his care.  Vital signs are stable with a good blood pressure. He is here today with upper respiratory symptoms.'s been going on for 5 almost 6 days.  Sinus congestion, nasal congestion, trouble breathing through nose.  Mild sore throat, postnasal drip, cough.  Not he feels like he is wheezing.  He is short of breath with activity.  Had asthma as a child but has not used any inhalers since he is been an adult.  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  He has been able to work since late last week.  He was exposed to influenza.  Has not had sweats and chills, fever, or significant body aches.  No chest pain.  Past Medical History:  Diagnosis Date  . 2nd degree AV block    a. noted during 02/2015 admission - second degree AV AV block (not further defined in notes). Not on BB due to this.  . Asthma    Childhood  . Bradycardia    a. noted during 02/2015 admission - second degree AV AV block (not further defined in notes). Not on BB due to this.  . Chronic combined systolic and diastolic CHF (congestive heart failure) (HCC)    a. echo 03/06/15 showing mild LVH, EF 40%, high ventricular filling pressures, mod MR, mildly dilated RV/mildly reduced RV function, mildly dilated RA, PASP .  . Diabetes mellitus, type 2 (HCC)   . Essential hypertension   . Family history of adverse reaction to anesthesia    mother and aunt  both put to sleep for surgery and "stopped breathing"  . Morbid obesity (HCC)   . NSVT (nonsustained ventricular tachycardia) (HCC)    a. noted during 02/2015 admission.  . OSA (obstructive sleep apnea)     a. noncompliance with CPAP - wakes up with it off.    Patient Active Problem List   Diagnosis Date Noted  . Hypertensive cardiomegaly with heart failure (HCC) 11/12/2017  . Chronic renal insufficiency, stage III (moderate) (HCC) 11/12/2017  . Acute hypoxemic respiratory failure (HCC) 10/22/2017  . Tobacco abuse 01/23/2016  . Second degree AV block, Mobitz type I   . NSVT (nonsustained ventricular tachycardia) (HCC)   . Diabetes mellitus, type 2 (HCC)   . Morbid obesity (HCC)   . Cardiomyopathy (HCC) 03/08/2015  . Acute combined systolic and diastolic congestive heart failure (HCC) 03/08/2015  . OSA (obstructive sleep apnea) 03/08/2015  . 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 Edema 03/05/2015    Past Surgical History:  Procedure Laterality Date  . NO PAST SURGERIES         Home Medications    Prior to Admission medications   Medication Sig Start Date End Date Taking? Authorizing Provider  benzonatate (TESSALON) 200 MG capsule Take 1 capsule (200 mg total) by mouth 2 (two) times daily as needed for cough. 12/15/18   Eustace Moore, MD  carvedilol (COREG) 12.5 MG tablet Take 1 tablet (12.5 mg total) by mouth 2 (two) times daily. 01/22/18 04/22/18  Abelino Derrick, PA-C  cetirizine (ZYRTEC) 10 MG tablet Take 1 tablet (  10 mg total) by mouth daily. 08/07/17   Mikell, Antionette PolesAsiyah Zahra, MD  fluticasone (FLONASE) 50 MCG/ACT nasal spray Place 1 spray into both nostrils daily. 1 spray in each nostril every day 12/15/18   Eustace MooreNelson, Yvonne Sue, MD  furosemide (LASIX) 80 MG tablet TAKE 1 TABLET BY MOUTH TWICE A DAY 09/22/18   Croitoru, Mihai, MD  isosorbide-hydrALAZINE (BIDIL) 20-37.5 MG tablet Take 1 tablet by mouth 3 (three) times daily. 11/11/18   Croitoru, Mihai, MD  metolazone (ZAROXOLYN) 2.5 MG tablet Take 1 tab (2.5) tab by mouth Tuesday, Thursday and Saturday 10/30/17   Rama, Maryruth Bunhristina P, MD  predniSONE (DELTASONE) 20 MG tablet Take 1 tablet (20 mg total) by mouth 2 (two) times daily with a  meal. 12/15/18   Eustace MooreNelson, Yvonne Sue, MD  sacubitril-valsartan (ENTRESTO) 97-103 MG Take 1 tablet by mouth 2 (two) times daily. 04/28/18   Croitoru, Mihai, MD  spironolactone (ALDACTONE) 50 MG tablet Take 1 tablet (50 mg total) daily by mouth. 10/30/17 01/28/18  Rama, Maryruth Bunhristina P, MD    Family History Family History  Problem Relation Age of Onset  . Hypertension Mother   . Hyperlipidemia Mother   . Heart failure Mother   . Multiple sclerosis Mother   . Breast cancer Maternal Grandmother   . Diabetes Maternal Grandmother   . Colon cancer Maternal Uncle     Social History Social History   Tobacco Use  . Smoking status: Current Every Day Smoker    Packs/day: 0.50    Years: 19.00    Pack years: 9.50    Types: Cigarettes    Start date: 01/05/1996    Last attempt to quit: 03/04/2015    Years since quitting: 3.7  . Smokeless tobacco: Never Used  . Tobacco comment: Already decreasaed smoking  Substance Use Topics  . Alcohol use: No    Alcohol/week: 1.0 - 2.0 standard drinks    Types: 1 - 2 Standard drinks or equivalent per week    Comment: Occasional  . Drug use: No     Allergies   Penicillins   Review of Systems Review of Systems  Constitutional: Positive for fatigue. Negative for chills and fever.  HENT: Positive for congestion, postnasal drip and rhinorrhea. Negative for ear pain and sore throat.   Eyes: Negative for pain and visual disturbance.  Respiratory: Positive for cough, shortness of breath and wheezing.   Cardiovascular: Negative for chest pain and palpitations.  Gastrointestinal: Negative for abdominal pain and vomiting.  Genitourinary: Negative for dysuria and hematuria.  Musculoskeletal: Negative for arthralgias and back pain.  Skin: Negative for color change and rash.  Neurological: Positive for headaches. Negative for seizures and syncope.  All other systems reviewed and are negative.    Physical Exam Triage Vital Signs ED Triage Vitals  Enc Vitals  Group     BP 12/15/18 0932 137/86     Pulse Rate 12/15/18 0932 85     Resp 12/15/18 0932 20     Temp 12/15/18 0932 98.6 F (37 C)     Temp Source 12/15/18 0932 Oral     SpO2 12/15/18 0932 99 %     Weight --      Height --      Head Circumference --      Peak Flow --      Pain Score 12/15/18 0930 0     Pain Loc --      Pain Edu? --      Excl. in GC? --  No data found.  Updated Vital Signs BP 137/86 (BP Location: Left Arm)   Pulse 85   Temp 98.6 F (37 C) (Oral)   Resp 20   SpO2 99%   Vi   Physical Exam Constitutional:      General: He is not in acute distress.    Appearance: He is well-developed. He is obese. He is ill-appearing.     Comments: Peers tired.  Occasional cough  HENT:     Head: Normocephalic and atraumatic.     Right Ear: Tympanic membrane and ear canal normal.     Left Ear: Tympanic membrane and ear canal normal.     Nose: Congestion and rhinorrhea present.     Mouth/Throat:     Mouth: Mucous membranes are moist.  Eyes:     Conjunctiva/sclera: Conjunctivae normal.     Pupils: Pupils are equal, round, and reactive to light.  Neck:     Musculoskeletal: Normal range of motion.  Cardiovascular:     Rate and Rhythm: Normal rate and regular rhythm.     Heart sounds: Normal heart sounds.     Comments: Heart sounds decreased.  Large body habitus Pulmonary:     Effort: Pulmonary effort is normal. No respiratory distress.     Breath sounds: Wheezing present.     Comments: Few scattered wheezes throughout bases Abdominal:     General: There is no distension.     Palpations: Abdomen is soft.     Comments: Protuberant  Musculoskeletal: Normal range of motion.  Lymphadenopathy:     Cervical: No cervical adenopathy.  Skin:    General: Skin is warm and dry.  Neurological:     General: No focal deficit present.     Mental Status: He is alert. Mental status is at baseline.  Psychiatric:        Mood and Affect: Mood normal.        Thought Content:  Thought content normal.      UC Treatments / Results  Labs (all labs ordered are listed, but only abnormal results are displayed) Labs Reviewed - No data to display  EKG None  Radiology No results found.  Procedures Procedures (including critical care time)  Medications Ordered in UC Medications - No data to display  Initial Impression / Assessment and Plan / UC Course  I have reviewed the triage vital signs and the nursing notes.  Pertinent labs & imaging results that were available during my care of the patient were reviewed by me and considered in my medical decision making (see chart for details).     Patient with viral upper respiratory infection.  Will treat symptomatically.  I am giving him 5 days of steroids because he is wheezing.  I do have some concern regarding his history of heart failure.  He states this is been stable.  He is advised to watch his fluids, daily weight, be careful with his salt intake. Final Clinical Impressions(s) / UC Diagnoses   Final diagnoses:  Viral URI with cough  Wheezing     Discharge Instructions     Take the prednisone 2 x a day for 5 days Be careful with your salt intake especially on the prednisone so you do not have fluid overload Use the flonase as directed Take tessalon for the cough Rest Push fluids Come back if worse, or if you fail to see improvement in a couple of days   ED Prescriptions    Medication Sig Dispense Auth. Provider  fluticasone (FLONASE) 50 MCG/ACT nasal spray Place 1 spray into both nostrils daily. 1 spray in each nostril every day 16 g Eustace Moore, MD   predniSONE (DELTASONE) 20 MG tablet Take 1 tablet (20 mg total) by mouth 2 (two) times daily with a meal. 10 tablet Eustace Moore, MD   benzonatate (TESSALON) 200 MG capsule Take 1 capsule (200 mg total) by mouth 2 (two) times daily as needed for cough. 20 capsule Eustace Moore, MD     Controlled Substance Prescriptions [place]  Controlled Substance Registry consulted? Not Applicable   Eustace Moore, MD 12/15/18 1024

## 2018-12-15 NOTE — Discharge Instructions (Signed)
Take the prednisone 2 x a day for 5 days Be careful with your salt intake especially on the prednisone so you do not have fluid overload Use the flonase as directed Take tessalon for the cough Rest Push fluids Come back if worse, or if you fail to see improvement in a couple of days

## 2018-12-15 NOTE — ED Triage Notes (Signed)
Patient complains of cough, slight runny nose, chest congestion, complains of yellowish phlegm.  Onset last Thursday, has been taking mucinex since then

## 2019-01-12 ENCOUNTER — Other Ambulatory Visit: Payer: Self-pay

## 2019-01-12 ENCOUNTER — Telehealth: Payer: Self-pay | Admitting: Cardiovascular Disease

## 2019-01-12 MED ORDER — SACUBITRIL-VALSARTAN 97-103 MG PO TABS: 1.0000 | ORAL_TABLET | Freq: Two times a day (BID) | ORAL | 0 refills | Status: DC

## 2019-01-12 MED ORDER — SPIRONOLACTONE 50 MG PO TABS: 50.0000 mg | ORAL_TABLET | Freq: Every day | ORAL | 0 refills | Status: DC

## 2019-01-12 NOTE — Telephone Encounter (Signed)
New  Message     *STAT* If patient is at the pharmacy, call can be transferred to refill team.   1. Which medications need to be refilled? (please list name of each medication and dose if known)  spironolactone (ALDACTONE) 50 MG tablet (Expired)   sacubitril-valsartan (ENTRESTO) 97-103 MG  2. Which pharmacy/location (including street and city if local pharmacy) is medication to be sent to? [id number] [place], [place] [employer] - [address]  3. Do they need a 30 day or 90 day supply? 30 day

## 2019-01-21 ENCOUNTER — Ambulatory Visit: Payer: Managed Care, Other (non HMO) | Admitting: Cardiovascular Disease

## 2019-01-22 ENCOUNTER — Encounter (INDEPENDENT_AMBULATORY_CARE_PROVIDER_SITE_OTHER): Payer: Self-pay

## 2019-01-22 ENCOUNTER — Ambulatory Visit (INDEPENDENT_AMBULATORY_CARE_PROVIDER_SITE_OTHER): Payer: Managed Care, Other (non HMO) | Admitting: Physician Assistant

## 2019-01-22 ENCOUNTER — Encounter: Payer: Self-pay | Admitting: Physician Assistant

## 2019-01-22 VITALS — BP 124/96 | HR 86 | Ht 73.0 in | Wt 358.0 lb

## 2019-01-22 DIAGNOSIS — I110 Hypertensive heart disease with heart failure: Secondary | ICD-10-CM | POA: Diagnosis not present

## 2019-01-22 DIAGNOSIS — I5082 Biventricular heart failure: Secondary | ICD-10-CM

## 2019-01-22 NOTE — Patient Instructions (Signed)
Medication Instructions:   Your physician recommends that you continue on your current medications as directed. Please refer to the Current Medication list given to you today.  If you need a refill on your cardiac medications before your next appointment, please call your pharmacy.   Lab work:  NONE  If you have labs (blood work) drawn today and your tests are completely normal, you will receive your results only by: Marland Kitchen MyChart Message (if you have MyChart) OR . A paper copy in the mail If you have any lab test that is abnormal or we need to change your treatment, we will call you to review the results.  Testing/Procedures:  NONE  Follow-Up: At [facility], you and your health needs are our priority.  As part of our continuing mission to provide you with exceptional heart care, we have created designated Provider Care Teams.  These Care Teams include your primary Cardiologist (physician) and Advanced Practice Providers (APPs -  Physician Assistants and Nurse Practitioners) who all work together to provide you with the care you need, when you need it. You will need a follow up appointment in 4-6 months.  Please call our office 2 months in advance to schedule this appointment.  You may see Thurmon Fair, MD or one of the following Advanced Practice Providers on your designated Care Team: [place], [place] . Micah Flesher, PA-C

## 2019-01-22 NOTE — Progress Notes (Signed)
Cardiology Office Note    Date:  01/24/2019   ID:  Roger Ward, DOB 09-Jun-1981, MRN [id number]  PCP:  Patient, No Pcp Per  Cardiologist: Dr. Royann Shivers  Chief Complaint  Patient presents with  . Follow-up    6 months.    History of Present Illness:  Roger Ward is a 38 y.o. male with past medical history of super morbid obesity, malignant hypertension, suspected OSA, biventricular failure, mild to moderate LV dysfunction, moderate pulmonary artery hypertension.  Previous echocardiogram obtained on 03/06/2015 showed EF 40%, diffuse hypokinesis, moderate MR.  48-hour Holter monitor obtained in March 2017 showed normal sinus rhythm with occasional PVCs.  He was admitted with acute on chronic systolic heart failure secondary to his noncompliance with medical treatment in November 2018.  His admission weight was 50 pounds above his baseline dry weight.  Repeat echocardiogram obtained on 10/22/2017 showed EF 35 to 40%, diffuse hypokinesis, D-shaped interventricular septum suggestive of RV pressure overload, moderately reduced RV EF, PA peak pressure 40 mmHg.  He was not on beta-blocker previously secondary to second-degree AV block.  During the hospitalization, he did have runs of nonsustained VT.  Low-dose carvedilol was added.  Sherryll Burger was added as outpatient.  Patient was last seen by Corine Shelter, PA-C on 06/03/2018.   Patient was last seen in the ED on 12/15/2018 for upper URI symptoms.  He was discharged from the ED with prednisone, Flonase and Tessalon.  Patient presents today for cardiology office visit.  He says he is no longer on the metolazone.  He is still taking the Lasix 80 mg twice daily, spironolactone and Entresto.  Lab work obtained in the emergency room in December 2019 showed stable renal function and electrolyte.  He appears to be euvolemic on physical exam.  He is coming down with another URI with runny nose.  If this symptom does not improve in 2 weeks, I asked him to see his  primary care provider.  Otherwise, he has been compliant on the current medication.  He has no significant orthopnea or PND.  He can follow-up with Dr. Royann Shivers in 4 to 6 months, at which time, Dr. Royann Shivers can consider a repeat echocardiogram.   Past Medical History:  Diagnosis Date  . 2nd degree AV block    a. noted during 02/2015 admission - second degree AV AV block (not further defined in notes). Not on BB due to this.  . Asthma    Childhood  . Bradycardia    a. noted during 02/2015 admission - second degree AV AV block (not further defined in notes). Not on BB due to this.  . Chronic combined systolic and diastolic CHF (congestive heart failure) (HCC)    a. echo 03/06/15 showing mild LVH, EF 40%, high ventricular filling pressures, mod MR, mildly dilated RV/mildly reduced RV function, mildly dilated RA, PASP .  . Diabetes mellitus, type 2 (HCC)   . Essential hypertension   . Family history of adverse reaction to anesthesia    mother and aunt  both put to sleep for surgery and "stopped breathing"  . Morbid obesity (HCC)   . NSVT (nonsustained ventricular tachycardia) (HCC)    a. noted during 02/2015 admission.  . OSA (obstructive sleep apnea)    a. noncompliance with CPAP - wakes up with it off.    Past Surgical History:  Procedure Laterality Date  . NO PAST SURGERIES      Current Medications: Outpatient Medications Prior to Visit  Medication Sig  Dispense Refill  . benzonatate (TESSALON) 200 MG capsule Take 1 capsule (200 mg total) by mouth 2 (two) times daily as needed for cough. 20 capsule 0  . carvedilol (COREG) 12.5 MG tablet Take 1 tablet (12.5 mg total) by mouth 2 (two) times daily. 60 tablet 6  . cetirizine (ZYRTEC) 10 MG tablet Take 1 tablet (10 mg total) by mouth daily. 30 tablet 0  . fluticasone (FLONASE) 50 MCG/ACT nasal spray Place 1 spray into both nostrils daily. 1 spray in each nostril every day 16 g 0  . furosemide (LASIX) 80 MG tablet TAKE 1 TABLET BY MOUTH  TWICE A DAY 180 tablet 1  . isosorbide-hydrALAZINE (BIDIL) 20-37.5 MG tablet Take 1 tablet by mouth 3 (three) times daily. 90 tablet 3  . predniSONE (DELTASONE) 20 MG tablet Take 1 tablet (20 mg total) by mouth 2 (two) times daily with a meal. 10 tablet 0  . sacubitril-valsartan (ENTRESTO) 97-103 MG Take 1 tablet by mouth 2 (two) times daily. 60 tablet 0  . spironolactone (ALDACTONE) 50 MG tablet Take 1 tablet (50 mg total) by mouth daily. 30 tablet 0  . metolazone (ZAROXOLYN) 2.5 MG tablet Take 1 tab (2.5) tab by mouth Tuesday, Thursday and Saturday (Patient not taking: Reported on 01/22/2019) 30 tablet 3   No facility-administered medications prior to visit.      Allergies:   Penicillins   Social History   Socioeconomic History  . Marital status: Married    Spouse name: Not on file  . Number of children: 0  . Years of education: 2412  . Highest education level: Not on file  Occupational History  . Occupation: Merchandiser, retailupervisor at [employer]  Social Needs  . Financial resource strain: Not on file  . Food insecurity:    Worry: Not on file    Inability: Not on file  . Transportation needs:    Medical: Not on file    Non-medical: Not on file  Tobacco Use  . Smoking status: Current Every Day Smoker    Packs/day: 0.50    Years: 19.00    Pack years: 9.50    Types: Cigarettes    Start date: 01/05/1996    Last attempt to quit: 03/04/2015    Years since quitting: 3.8  . Smokeless tobacco: Never Used  . Tobacco comment: Already decreasaed smoking  Substance and Sexual Activity  . Alcohol use: No    Alcohol/week: 1.0 - 2.0 standard drinks    Types: 1 - 2 Standard drinks or equivalent per week    Comment: Occasional  . Drug use: No  . Sexual activity: Yes    Birth control/protection: Condom  Lifestyle  . Physical activity:    Days per week: Not on file    Minutes per session: Not on file  . Stress: Not on file  Relationships  . Social connections:    Talks on phone: Not on  file    Gets together: Not on file    Attends religious service: Not on file    Active member of club or organization: Not on file    Attends meetings of clubs or organizations: Not on file    Relationship status: Not on file  Other Topics Concern  . Not on file  Social History Narrative   [place]. Fun: Bowling, movies, we go   Denies religious beliefs that would effect health care.      Family History:  The patient's family history includes Breast cancer  in his maternal grandmother; Colon cancer in his maternal uncle; Diabetes in his maternal grandmother; Heart failure in his mother; Hyperlipidemia in his mother; Hypertension in his mother; Multiple sclerosis in his mother.   ROS:   Please see the history of present illness.    ROS All other systems reviewed and are negative.   PHYSICAL EXAM:   VS:  BP (!) 124/96 (BP Location: Left Arm, Patient Position: Sitting, Cuff Size: Large)   Pulse 86   Ht 6\' 1"  (1.854 m)   Wt (!) 358 lb (162.4 kg)   BMI 47.23 kg/m    GEN: Well nourished, well developed, in no acute distress  HEENT: normal  Neck: no JVD, carotid bruits, or masses Cardiac: RRR; no murmurs, rubs, or gallops,no edema  Respiratory:  clear to auscultation bilaterally, normal work of breathing GI: soft, nontender, nondistended, + BS MS: no deformity or atrophy  Skin: warm and dry, no rash Neuro:  Alert and Oriented x 3, Strength and sensation are intact Psych: euthymic mood, full affect  Wt Readings from Last 3 Encounters:  01/22/19 (!) 358 lb (162.4 kg)  06/03/18 (!) 350 lb (158.8 kg)  04/28/18 (!) 365 lb (165.6 kg)      Studies/Labs Reviewed:   EKG:  EKG is ordered today.  The ekg ordered today demonstrates normal sinus rhythm, right bundle branch block.  Recent Labs: 03/17/2018: Hemoglobin 17.3 06/03/2018: BUN 7; Creatinine, Ser 1.01; Potassium 4.3; Sodium 141   Lipid Panel    Component Value Date/Time   CHOL 166 03/06/2015 0155   TRIG  248 (H) 03/06/2015 0155   HDL 42 03/06/2015 0155   CHOLHDL 4.0 03/06/2015 0155   VLDL 50 (H) 03/06/2015 0155   LDLCALC 74 03/06/2015 0155    Additional studies/ records that were reviewed today include:   Echo 03/06/2015 LV EF: 40% Study Conclusions  - Left ventricle: The cavity size was mildly to moderately dilated. Wall thickness was increased in a pattern of mild LVH. The estimated ejection fraction was 40%. Diffuse hypokinesis. Doppler parameters are consistent with high ventricular filling pressure. - Mitral valve: There was moderate regurgitation. - Left atrium: The atrium was mildly dilated. - Right ventricle: The cavity size was mildly dilated. Systolic function was mildly reduced. - Right atrium: The atrium was mildly dilated. - Pulmonary arteries: PA peak pressure: 45 mm Hg (S).   Echo 10/22/2017 LV EF: 35% -   40% Study Conclusions  - Left ventricle: The cavity size was mildly dilated. Wall   thickness was normal. Systolic function was moderately reduced.   The estimated ejection fraction was in the range of 35% to 40%.   Diffuse hypokinesis. Doppler parameters are consistent with   abnormal left ventricular relaxation (grade 1 diastolic   dysfunction). - Ventricular septum: D-shaped interventricular septum suggestive   of RV pressure/volume overload. - Aortic valve: There was no stenosis. - Mitral valve: There was no significant regurgitation. - Right ventricle: Poorly visualized. The cavity size was   moderately dilated. Systolic function was moderately reduced. - Right atrium: Poorly visualized. - Tricuspid valve: Peak RV-RA gradient (S): 25 mm Hg. - Pulmonary arteries: PA peak pressure: 40 mm Hg (S). - Systemic veins: IVC measured 3 cm with < 50% respirophasic   variation, suggesting RA pressure 15 mmHg.  Impressions:  - Technically difficult study with poor acoustic windows. Mildly   dilated LV with EF 35-40%, diffuse hypokinesis (LV was  difficult   to evaluate even with Definity echo contrast). The RV  was poorly   visualized. Probably moderately dilated with moderately decreased   systolic function. D-shaped interventricular septum suggestive of   RV pressure/volume overload. Mild pulmonary hypertension. Dilated   IVC suggestive of elevated RV filling pressure.  ASSESSMENT:    1. Biventricular failure (HCC)   2. Hypertensive cardiomegaly with heart failure (HCC)   3. Morbid obesity (HCC)      PLAN:  In order of problems listed above:  1. Biventricular failure: Improvement dependent on weight loss.  Continue carvedilol, BiDil, spironolactone and Entresto.  Renal function and electrolytes stable on current therapy.  Will defer to Dr. Royann Shiversroitoru to decide if he wished to obtain a repeat echocardiogram on follow-up  2. Hypertensive heart disease: Blood pressure better controlled on current medication.  He has been compliant with his medical regimen  3. Morbid obesity: Weight loss is imperative.    Medication Adjustments/Labs and Tests Ordered: Current medicines are reviewed at length with the patient today.  Concerns regarding medicines are outlined above.  Medication changes, Labs and Tests ordered today are listed in the Patient Instructions below. Patient Instructions  Medication Instructions:   Your physician recommends that you continue on your current medications as directed. Please refer to the Current Medication list given to you today.  If you need a refill on your cardiac medications before your next appointment, please call your pharmacy.   Lab work:  NONE  If you have labs (blood work) drawn today and your tests are completely normal, you will receive your results only by: Marland Kitchen. MyChart Message (if you have MyChart) OR . A paper copy in the mail If you have any lab test that is abnormal or we need to change your treatment, we will call you to review the  results.  Testing/Procedures:  NONE  Follow-Up: At [facility], you and your health needs are our priority.  As part of our continuing mission to provide you with exceptional heart care, we have created designated Provider Care Teams.  These Care Teams include your primary Cardiologist (physician) and Advanced Practice Providers (APPs -  Physician Assistants and Nurse Practitioners) who all work together to provide you with the care you need, when you need it. You will need a follow up appointment in 4-6 months.  Please call our office 2 months in advance to schedule this appointment.  You may see Thurmon FairMihai Croitoru, MD or one of the following Advanced Practice Providers on your designated Care Team: [place] Rohith Fauth, [place] . Micah FlesherAngela Duke, PA-C       Signed, [place] Gay Mon[place], [place]  01/24/2019 11:39 PM    [facility] [address], [place], [place]  [address] Phone: [phone]; Fax: [phone]

## 2019-01-24 ENCOUNTER — Encounter: Payer: Self-pay | Admitting: Physician Assistant

## 2019-03-02 ENCOUNTER — Other Ambulatory Visit: Payer: Self-pay

## 2019-03-02 ENCOUNTER — Ambulatory Visit (HOSPITAL_COMMUNITY)
Admission: EM | Admit: 2019-03-02 | Discharge: 2019-03-02 | Disposition: A | Discharge status: Home / Self Care | Payer: Managed Care, Other (non HMO) | Attending: Physician Assistant | Admitting: Physician Assistant

## 2019-03-02 ENCOUNTER — Encounter (HOSPITAL_COMMUNITY): Payer: Self-pay | Admitting: Emergency Medicine

## 2019-03-02 DIAGNOSIS — M109 Gout, unspecified: Secondary | ICD-10-CM | POA: Diagnosis not present

## 2019-03-02 MED ORDER — COLCHICINE 0.6 MG PO TABS: 0.6000 mg | ORAL_TABLET | Freq: Every day | ORAL | 1 refills | Status: DC

## 2019-03-02 MED ORDER — PREDNISONE 10 MG PO TABS: ORAL_TABLET | ORAL | 0 refills | Status: DC

## 2019-03-02 NOTE — Discharge Instructions (Signed)
Return if any problems.

## 2019-03-02 NOTE — ED Triage Notes (Signed)
Woke during the night with left great toe pain.  Today it is hurting more. No known injury

## 2019-03-02 NOTE — ED Provider Notes (Signed)
[facility]    CSN: [id number] Arrival date & time: 03/02/19  1348     History   Chief Complaint Chief Complaint  Patient presents with  . Toe Pain    HPI Roger Ward is a 38 y.o. male.   The history is provided by the patient. No language interpreter was used.  Toe Pain  This is a new problem. The current episode started 6 to 12 hours ago. The problem occurs constantly. The problem has been gradually worsening. Nothing aggravates the symptoms. Nothing relieves the symptoms. He has tried nothing for the symptoms. The treatment provided no relief.    Past Medical History:  Diagnosis Date  . 2nd degree AV block    a. noted during 02/2015 admission - second degree AV AV block (not further defined in notes). Not on BB due to this.  . Asthma    Childhood  . Bradycardia    a. noted during 02/2015 admission - second degree AV AV block (not further defined in notes). Not on BB due to this.  . Chronic combined systolic and diastolic CHF (congestive heart failure) (HCC)    a. echo 03/06/15 showing mild LVH, EF 40%, high ventricular filling pressures, mod MR, mildly dilated RV/mildly reduced RV function, mildly dilated RA, PASP .  . Diabetes mellitus, type 2 (HCC)   . Essential hypertension   . Family history of adverse reaction to anesthesia    mother and aunt  both put to sleep for surgery and "stopped breathing"  . Morbid obesity (HCC)   . NSVT (nonsustained ventricular tachycardia) (HCC)    a. noted during 02/2015 admission.  . OSA (obstructive sleep apnea)    a. noncompliance with CPAP - wakes up with it off.    Patient Active Problem List   Diagnosis Date Noted  . Hypertensive cardiomegaly with heart failure (HCC) 11/12/2017  . Chronic renal insufficiency, stage III (moderate) (HCC) 11/12/2017  . Acute hypoxemic respiratory failure (HCC) 10/22/2017  . Tobacco abuse 01/23/2016  . Second degree AV block, Mobitz type I   . NSVT (nonsustained ventricular  tachycardia) (HCC)   . Diabetes mellitus, type 2 (HCC)   . Morbid obesity (HCC)   . Cardiomyopathy (HCC) 03/08/2015  . Acute combined systolic and diastolic congestive heart failure (HCC) 03/08/2015  . OSA (obstructive sleep apnea) 03/08/2015  . 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 Edema 03/05/2015    Past Surgical History:  Procedure Laterality Date  . NO PAST SURGERIES         Home Medications    Prior to Admission medications   Medication Sig Start Date End Date Taking? Authorizing Provider  carvedilol (COREG) 12.5 MG tablet Take 1 tablet (12.5 mg total) by mouth 2 (two) times daily. 01/22/18 01/22/19  Abelino Derrick, PA-C  cetirizine (ZYRTEC) 10 MG tablet Take 1 tablet (10 mg total) by mouth daily. 08/07/17   Mikell, Antionette Poles, MD  colchicine 0.6 MG tablet Take 1 tablet (0.6 mg total) by mouth daily. 03/02/19   Elson Areas, PA-C  fluticasone (FLONASE) 50 MCG/ACT nasal spray Place 1 spray into both nostrils daily. 1 spray in each nostril every day 12/15/18   Eustace Moore, MD  furosemide (LASIX) 80 MG tablet TAKE 1 TABLET BY MOUTH TWICE A DAY 09/22/18   Croitoru, Mihai, MD  isosorbide-hydrALAZINE (BIDIL) 20-37.5 MG tablet Take 1 tablet by mouth 3 (three) times daily. 11/11/18   Croitoru, Mihai, MD  predniSONE (DELTASONE) 10 MG tablet 6,5,4,3,2,1 taper 03/02/19  Cheron Schaumann K, PA-C  sacubitril-valsartan (ENTRESTO) 97-103 MG Take 1 tablet by mouth 2 (two) times daily. 01/12/19   Croitoru, Mihai, MD  spironolactone (ALDACTONE) 50 MG tablet Take 1 tablet (50 mg total) by mouth daily. 01/12/19 04/12/19  Croitoru, Rachelle Hora, MD    Family History Family History  Problem Relation Age of Onset  . Hypertension Mother   . Hyperlipidemia Mother   . Heart failure Mother   . Multiple sclerosis Mother   . Breast cancer Maternal Grandmother   . Diabetes Maternal Grandmother   . Colon cancer Maternal Uncle     Social History Social History   Tobacco Use  . Smoking status: Current  Every Day Smoker    Packs/day: 0.50    Years: 19.00    Pack years: 9.50    Types: Cigarettes    Start date: 01/05/1996    Last attempt to quit: 03/04/2015    Years since quitting: 3.9  . Smokeless tobacco: Never Used  . Tobacco comment: Already decreasaed smoking  Substance Use Topics  . Alcohol use: No    Alcohol/week: 1.0 - 2.0 standard drinks    Types: 1 - 2 Standard drinks or equivalent per week    Comment: Occasional  . Drug use: No     Allergies   Penicillins   Review of Systems Review of Systems  Musculoskeletal: Positive for myalgias.  Skin: Negative for color change.  All other systems reviewed and are negative.    Physical Exam Triage Vital Signs ED Triage Vitals  Enc Vitals Group     BP 03/02/19 1447 123/84     Pulse Rate 03/02/19 1447 86     Resp 03/02/19 1447 (!) 22     Temp 03/02/19 1447 97.7 F (36.5 C)     Temp Source 03/02/19 1447 Temporal     SpO2 03/02/19 1447 98 %     Weight --      Height --      Head Circumference --      Peak Flow --      Pain Score 03/02/19 1444 8     Pain Loc --      Pain Edu? --      Excl. in GC? --    No data found.  Updated Vital Signs BP 123/84 (BP Location: Right Arm) Comment (BP Location): large cuff  Pulse 86   Temp 97.7 F (36.5 C) (Temporal)   Resp (!) 22   SpO2 98%   Visual Acuity Right Eye Distance:   Left Eye Distance:   Bilateral Distance:    Right Eye Near:   Left Eye Near:    Bilateral Near:     Physical Exam Vitals signs and nursing note reviewed.  Constitutional:      Appearance: He is well-developed.  HENT:     Head: Normocephalic and atraumatic.  Neck:     Musculoskeletal: Neck supple.  Cardiovascular:     Rate and Rhythm: Normal rate and regular rhythm.     Heart sounds: No murmur.  Pulmonary:     Effort: Pulmonary effort is normal. No respiratory distress.  Abdominal:     Tenderness: There is no abdominal tenderness.  Musculoskeletal: Normal range of motion.     Comments:  Swollen tender 1st great toe left foot.  Tender MTP joint  nv and ns intact,    Skin:    General: Skin is warm and dry.  Neurological:     General: No focal deficit present.  Mental Status: He is alert.  Psychiatric:        Mood and Affect: Mood normal.      UC Treatments / Results  Labs (all labs ordered are listed, but only abnormal results are displayed) Labs Reviewed - No data to display  EKG None  Radiology No results found.  Procedures Procedures (including critical care time)  Medications Ordered in UC Medications - No data to display  Initial Impression / Assessment and Plan / UC Course  I have reviewed the triage vital signs and the nursing notes.  Pertinent labs & imaging results that were available during my care of the patient were reviewed by me and considered in my medical decision making (see chart for details).     MDM  Symptoms and exam consistent with gout .  Pt counseled on diet and medications.  Final Clinical Impressions(s) / UC Diagnoses   Final diagnoses:  Acute gout involving toe of left foot, unspecified cause     Discharge Instructions     Return if any problems.    ED Prescriptions    Medication Sig Dispense Auth. Provider   predniSONE (DELTASONE) 10 MG tablet 6,5,4,3,2,1 taper 15 tablet Cheron Schaumann K, [place]   colchicine 0.6 MG tablet Take 1 tablet (0.6 mg total) by mouth daily. 10 tablet Elson Areas, [place]     Controlled Substance Prescriptions [place] Controlled Substance Registry consulted? Not Applicable  An After Visit Summary was printed and given to the patient.    Elson Areas, [place] 03/02/19 1631

## 2019-03-13 ENCOUNTER — Telehealth: Payer: Self-pay | Admitting: Cardiovascular Disease

## 2019-03-13 NOTE — Telephone Encounter (Signed)
OK to provide a note stating that he is at high risk for serious complications from COVID-19 and should stay home until the current outbreak is under control. Please make sure that he understands that we do not know how long that will take and how his employer may respond to that. A good employer will not fire him, but not all employers are understanding... MCr

## 2019-03-13 NOTE — Telephone Encounter (Signed)
Advised patient and he will discuss with employee what exactly he needs.

## 2019-03-13 NOTE — Telephone Encounter (Signed)
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 He can stay home if he has a note  Will forward to Dr Royann Shivers for review

## 2019-03-13 NOTE — Telephone Encounter (Signed)
Patient states he has CHF and his job is still open he wants to know if he should still be going to work. Wants to know how dangerous is COVID-19 is to him.

## 2019-03-15 ENCOUNTER — Other Ambulatory Visit: Payer: Self-pay | Admitting: Cardiovascular Disease

## 2019-03-16 NOTE — Telephone Encounter (Signed)
Patient is calling to find out if letter is ready to be picked up.

## 2019-03-16 NOTE — Telephone Encounter (Signed)
Letter advising patient to stay home faxed to Bartolo Darter at fax # [phone].

## 2019-03-16 NOTE — Telephone Encounter (Signed)
The Fax Number is [phone] Attn: Bartolo Darter

## 2019-03-16 NOTE — Telephone Encounter (Signed)
Spoke with patient and he just needs letter to say he is at higher risk. He will call back with fax number and persons name who it needs to be sent to

## 2019-03-16 NOTE — Telephone Encounter (Signed)
Follow up:    Patient calling back concerning his note. Pease call patient back concerning his note.

## 2019-04-10 ENCOUNTER — Other Ambulatory Visit: Payer: Self-pay | Admitting: Cardiovascular Disease

## 2019-04-10 NOTE — Telephone Encounter (Signed)
Aldactone refilled

## 2019-04-11 ENCOUNTER — Other Ambulatory Visit: Payer: Self-pay | Admitting: Cardiology

## 2019-04-11 ENCOUNTER — Other Ambulatory Visit: Payer: Self-pay | Admitting: Cardiovascular Disease

## 2019-04-13 ENCOUNTER — Telehealth: Payer: Self-pay | Admitting: Cardiovascular Disease

## 2019-04-13 ENCOUNTER — Encounter: Payer: Self-pay | Admitting: Cardiovascular Disease

## 2019-04-13 NOTE — Telephone Encounter (Signed)
Entresto refilled. 

## 2019-04-13 NOTE — Telephone Encounter (Signed)
Carvedilol 12.5 mg refilled. 

## 2019-04-13 NOTE — Telephone Encounter (Signed)
Letter faxed to [facility] at fax # [phone].

## 2019-04-13 NOTE — Telephone Encounter (Signed)
Pt stopped working due to COVID19 on 03/16/19.Marland Kitchen but now he is out of paid time off and would like Dr. Royann Shivers to send a letter that he can return to work this week... they are promising that he can work in a secluded area and wear a mask so that he is under very limited contact with others...   Pt states to just add on it that working 6 ft apart would be important for him.  When we call him back he will have a fax number for [place].  Should be To whom it may concern...  Initial letter we gave him is filed in the chart.

## 2019-04-13 NOTE — Telephone Encounter (Signed)
Follow Up:    Pt needs to talk to you about going back to work.

## 2019-04-13 NOTE — Telephone Encounter (Signed)
Pt advised Dr. Royann Shivers has a letter for him to return to work...   To be faxed to [phone] To Lelon Mast  Will forward to Triage nurse in the office to fax for the pt.

## 2019-04-13 NOTE — Telephone Encounter (Signed)
Letter in epic

## 2019-04-20 ENCOUNTER — Telehealth: Payer: Self-pay | Admitting: Cardiovascular Disease

## 2019-04-20 DIAGNOSIS — I420 Dilated cardiomyopathy: Secondary | ICD-10-CM

## 2019-04-20 NOTE — Telephone Encounter (Signed)
Message received from triage.  I returned patient call x 2 times.  No answer and no ability to leave a message.  Please let me know if he calls back.    He has not had lab work in almost a year.

## 2019-04-20 NOTE — Telephone Encounter (Signed)
Follow Up:; ° ° °Returning your call. °

## 2019-04-20 NOTE — Telephone Encounter (Signed)
Spoke with pt who states he is SOB when he gets up. States when he walks around for a little while he runs out of breath, but feels fine when sitting. Denies CP/discomfort. C/o cough 'here and there'. Pt admits indiscretion with salt stating that he has 'eaten a lot of salt' in the past week. States he has edema to BLE and estimates weight gain to be 4-5 lbs within a week. Pt states unable to monitor BP and HR at home. Advised would consult with a provider then contact pt with recommendations. Pt agreeable.

## 2019-04-20 NOTE — Telephone Encounter (Signed)
OK, agree [employer]

## 2019-04-20 NOTE — Telephone Encounter (Signed)
Returned patient call.  Pt states he has a cough and dyspnea on exertion. He has gained 5-10 lbs over the past week. He takes 80 mg lasix BID and has not taken extra lasix. He has been eating salt. He has not had sick contacts.  I advised him to stop eating salt. I also instructed him to take an extra 40 mg lasix daily x 3 days. He will come to [facility] for labwork on Wed. I do not want to be overly aggressive with his diuretic without recent labwork.  He will call the office back tomorrow if he hasn't noticed any improvement in his breathing or weight. We also discussed when to visit the ER.    He expressed understanding of the plan.

## 2019-04-20 NOTE — Telephone Encounter (Signed)
Pt c/o Shortness Of Breath: STAT if SOB developed within the last 24 hours or pt is noticeably SOB on the phone  1. Are you currently SOB (can you hear that pt is SOB on the phone)? no  2. How long have you been experiencing SOB? Few days  3. Are you SOB when sitting or when up moving around? Moving around  4. Are you currently experiencing any other symptoms? Coughing, and feeling weak if he walking from one room to the next.

## 2019-04-27 ENCOUNTER — Telehealth (INDEPENDENT_AMBULATORY_CARE_PROVIDER_SITE_OTHER): Payer: Managed Care, Other (non HMO) | Admitting: Cardiovascular Disease

## 2019-04-27 ENCOUNTER — Telehealth: Payer: Self-pay | Admitting: Cardiovascular Disease

## 2019-04-27 DIAGNOSIS — N2889 Other specified disorders of kidney and ureter: Secondary | ICD-10-CM

## 2019-04-27 DIAGNOSIS — I4729 Other ventricular tachycardia: Secondary | ICD-10-CM

## 2019-04-27 DIAGNOSIS — G4733 Obstructive sleep apnea (adult) (pediatric): Secondary | ICD-10-CM

## 2019-04-27 DIAGNOSIS — I10 Essential (primary) hypertension: Secondary | ICD-10-CM | POA: Diagnosis not present

## 2019-04-27 DIAGNOSIS — N183 Chronic kidney disease, stage 3 unspecified: Secondary | ICD-10-CM

## 2019-04-27 DIAGNOSIS — I472 Ventricular tachycardia: Secondary | ICD-10-CM

## 2019-04-27 DIAGNOSIS — I50813 Acute on chronic right heart failure: Secondary | ICD-10-CM | POA: Diagnosis not present

## 2019-04-27 DIAGNOSIS — I5043 Acute on chronic combined systolic (congestive) and diastolic (congestive) heart failure: Secondary | ICD-10-CM | POA: Diagnosis not present

## 2019-04-27 DIAGNOSIS — I441 Atrioventricular block, second degree: Secondary | ICD-10-CM

## 2019-04-27 MED ORDER — FUROSEMIDE 80 MG PO TABS: 80.0000 mg | ORAL_TABLET | Freq: Every day | ORAL | 1 refills | Status: DC

## 2019-04-27 NOTE — Telephone Encounter (Signed)
Returned call to patient he stated his sob is better.Stated he continues to have swelling in abdomen and upper legs. He is taking lasix 40 mg 2 tablets twice a day.He feels better and wanted to ask Dr.Croitoru if he can go back to work before his appointment with him this Thurs.Message sent to Dr.Croitoru.

## 2019-04-27 NOTE — Patient Instructions (Addendum)
Medication Instructions:  Once your weight has reached 350 pounds, please decrease your Furosemide to 80 mg once daily.   If you need a refill on your cardiac medications before your next appointment, please call your pharmacy.   Lab work: Please have a  BMET lab done this week. You may have this done at the [facility] office. You do not need any appointment. They are open from 8 am to 4:30 pm.   Testing/Procedures: None ordered  Follow-Up: At [facility], you and your health needs are our priority.  As part of our continuing mission to provide you with exceptional heart care, we have created designated Provider Care Teams.  These Care Teams include your primary Cardiologist (physician) and Advanced Practice Providers (APPs -  Physician Assistants and Nurse Practitioners) who all work together to provide you with the care you need, when you need it. You will need a follow up appointment in 4 weeks. You may see Thurmon Fair, MD or one of the following Advanced Practice Providers on your designated Care Team: [place], [place] . Micah Flesher, PA-C  Any Other Special Instructions Will Be Listed Below (If Applicable). Your physician recommends that you weigh yourself everyday at the same time, on the same scale and with the same amount of clothing. Please keep a record of these weights and bring to your next appointment. Please send today's weight through a MyChart message.  Please buy a blood pressure cuff and monitor your blood pressure and heart rate. Keep a log of these and bring to your next appointment or send a message through MyChart with any concerns.   Low-Sodium Eating Plan Sodium, which is an element that makes up salt, helps you maintain a healthy balance of fluids in your body. Too much sodium can increase your blood pressure and cause fluid and waste to be held in your body. Your health care provider or dietitian may recommend following this plan if you have high blood pressure  (hypertension), kidney disease, liver disease, or heart failure. Eating less sodium can help lower your blood pressure, reduce swelling, and protect your heart, liver, and kidneys. What are tips for following this plan? General guidelines  Most people on this plan should limit their sodium intake to 1,500-2,000 mg (milligrams) of sodium each day. Reading food labels   The Nutrition Facts label lists the amount of sodium in one serving of the food. If you eat more than one serving, you must multiply the listed amount of sodium by the number of servings.  Choose foods with less than 140 mg of sodium per serving.  Avoid foods with 300 mg of sodium or more per serving. Shopping  Look for lower-sodium products, often labeled as "low-sodium" or "no salt added."  Always check the sodium content even if foods are labeled as "unsalted" or "no salt added".  Buy fresh foods. ? Avoid canned foods and premade or frozen meals. ? Avoid canned, cured, or processed meats  Buy breads that have less than 80 mg of sodium per slice. Cooking  Eat more home-cooked food and less restaurant, buffet, and fast food.  Avoid adding salt when cooking. Use salt-free seasonings or herbs instead of table salt or sea salt. Check with your health care provider or pharmacist before using salt substitutes.  Cook with plant-based oils, such as canola, sunflower, or olive oil. Meal planning  When eating at a restaurant, ask that your food be prepared with less salt or no salt, if possible.  Avoid  foods that contain MSG (monosodium glutamate). MSG is sometimes added to [place] food, bouillon, and some canned foods. What foods are recommended? The items listed may not be a complete list. Talk with your dietitian about what dietary choices are best for you. Grains Low-sodium cereals, including oats, puffed wheat and rice, and shredded wheat. Low-sodium crackers. Unsalted rice. Unsalted pasta. Low-sodium bread.  Whole-grain breads and whole-grain pasta. Vegetables Fresh or frozen vegetables. "No salt added" canned vegetables. "No salt added" tomato sauce and paste. Low-sodium or reduced-sodium tomato and vegetable juice. Fruits Fresh, frozen, or canned fruit. Fruit juice. Meats and other protein foods Fresh or frozen (no salt added) meat, poultry, seafood, and fish. Low-sodium canned tuna and salmon. Unsalted nuts. Dried peas, beans, and lentils without added salt. Unsalted canned beans. Eggs. Unsalted nut butters. Dairy Milk. Soy milk. Cheese that is naturally low in sodium, such as ricotta cheese, fresh mozzarella, or Swiss cheese Low-sodium or reduced-sodium cheese. Cream cheese. Yogurt. Fats and oils Unsalted butter. Unsalted margarine with no trans fat. Vegetable oils such as canola or olive oils. Seasonings and other foods Fresh and dried herbs and spices. Salt-free seasonings. Low-sodium mustard and ketchup. Sodium-free salad dressing. Sodium-free light mayonnaise. Fresh or refrigerated horseradish. Lemon juice. Vinegar. Homemade, reduced-sodium, or low-sodium soups. Unsalted popcorn and pretzels. Low-salt or salt-free chips. What foods are not recommended? The items listed may not be a complete list. Talk with your dietitian about what dietary choices are best for you. Grains Instant hot cereals. Bread stuffing, pancake, and biscuit mixes. Croutons. Seasoned rice or pasta mixes. Noodle soup cups. Boxed or frozen macaroni and cheese. Regular salted crackers. Self-rising flour. Vegetables Sauerkraut, pickled vegetables, and relishes. Olives. [place] fries. Onion rings. Regular canned vegetables (not low-sodium or reduced-sodium). Regular canned tomato sauce and paste (not low-sodium or reduced-sodium). Regular tomato and vegetable juice (not low-sodium or reduced-sodium). Frozen vegetables in sauces. Meats and other protein foods Meat or fish that is salted, canned, smoked, spiced, or pickled.  Bacon, ham, sausage, hotdogs, corned beef, chipped beef, packaged lunch meats, salt pork, jerky, pickled herring, anchovies, regular canned tuna, sardines, salted nuts. Dairy Processed cheese and cheese spreads. Cheese curds. Blue cheese. Feta cheese. String cheese. Regular cottage cheese. Buttermilk. Canned milk. Fats and oils Salted butter. Regular margarine. Ghee. Bacon fat. Seasonings and other foods Onion salt, garlic salt, seasoned salt, table salt, and sea salt. Canned and packaged gravies. Worcestershire sauce. Tartar sauce. Barbecue sauce. Teriyaki sauce. Soy sauce, including reduced-sodium. Steak sauce. Fish sauce. Oyster sauce. Cocktail sauce. Horseradish that you find on the shelf. Regular ketchup and mustard. Meat flavorings and tenderizers. Bouillon cubes. Hot sauce and Tabasco sauce. Premade or packaged marinades. Premade or packaged taco seasonings. Relishes. Regular salad dressings. Salsa. Potato and tortilla chips. Corn chips and puffs. Salted popcorn and pretzels. Canned or dried soups. Pizza. Frozen entrees and pot pies. Summary  Eating less sodium can help lower your blood pressure, reduce swelling, and protect your heart, liver, and kidneys.  Most people on this plan should limit their sodium intake to 1,500-2,000 mg (milligrams) of sodium each day.  Canned, boxed, and frozen foods are high in sodium. Restaurant foods, fast foods, and pizza are also very high in sodium. You also get sodium by adding salt to food.  Try to cook at home, eat more fresh fruits and vegetables, and eat less fast food, canned, processed, or prepared foods. This information is not intended to replace advice given to you by your health care provider. Make  sure you discuss any questions you have with your health care provider. Document Released: 05/25/2002 Document Revised: 11/26/2016 Document Reviewed: 11/26/2016 Elsevier Interactive Patient Education  2019 [employer].

## 2019-04-27 NOTE — Progress Notes (Signed)
Virtual Visit via Video Note   This visit type was conducted due to national recommendations for restrictions regarding the COVID-19 Pandemic (e.g. social distancing) in an effort to limit this patient's exposure and mitigate transmission in our community.  Due to his co-morbid illnesses, this patient is at least at moderate risk for complications without adequate follow up.  This format is felt to be most appropriate for this patient at this time.  All issues noted in this document were discussed and addressed.  A limited physical exam was performed with this format.  Please refer to the patient's chart for his consent to telehealth for [name].   2 different video applications were attempted unsuccessfully.  The phone consultation was performed  Date:  04/27/2019   ID:  Roger Ward, DOB July 01, 1981, MRN [id number]  Patient Location: Other:  [employer] drive-in Provider Location: Other:  [facility]  PCP:  Veryl Speak, FNP  Cardiologist:  Thurmon Fair, MD  Electrophysiologist:  None   Evaluation Performed:  Follow-Up Visit  Chief Complaint: Edema and dyspnea  History of Present Illness:    Roger Ward is a 38 y.o. male with super morbid obesity, malignant hypertension, obstructive sleep apnea, biventricular failure, mild to moderate LV dysfunction (EF 40%), moderate pulmonary artery hypertension (PASP 45 mmHg) due to cor pulmonale.   He stayed out of work for several weeks due to the coronavirus pandemic, but return to work a couple of weeks ago.  Not long after that he began to noticed edema especially in his abdomen and thighs as well as reduction in exercise tolerance (NYHA functional class II-3 dyspnea).  He stopped working and has increase his dose of furosemide to 80 mg twice daily and has noticed a gradual improvement in edema and dyspnea, although he is not yet quite back to baseline.  He has not been weighing himself as recommended.  He came to our  office today not understanding that he was supposed to have a virtual visit.  When I called him for the virtual visit he was in the drive in at [employer].  Ever since going back to work he has been eating processed food and not preparing his own meals like he had done while he was at home.  He does not have a home blood pressure cuff.  He reports compliance with carvedilol, BiDil and Entresto.  He denies problems with angina, palpitations, syncope, lightheadedness, focal neurological events or excessive daytime hypersomnolence.  The patient does not have symptoms concerning for COVID-19 infection (fever, chills, cough, or new shortness of breath).    Past Medical History:  Diagnosis Date  . 2nd degree AV block    a. noted during 02/2015 admission - second degree AV AV block (not further defined in notes). Not on BB due to this.  . Asthma    Childhood  . Bradycardia    a. noted during 02/2015 admission - second degree AV AV block (not further defined in notes). Not on BB due to this.  . Chronic combined systolic and diastolic CHF (congestive heart failure) (HCC)    a. echo 03/06/15 showing mild LVH, EF 40%, high ventricular filling pressures, mod MR, mildly dilated RV/mildly reduced RV function, mildly dilated RA, PASP .  . Diabetes mellitus, type 2 (HCC)   . Essential hypertension   . Family history of adverse reaction to anesthesia    mother and aunt  both put to sleep for surgery and "stopped breathing"  . Morbid  obesity (HCC)   . NSVT (nonsustained ventricular tachycardia) (HCC)    a. noted during 02/2015 admission.  . OSA (obstructive sleep apnea)    a. noncompliance with CPAP - wakes up with it off.   Past Surgical History:  Procedure Laterality Date  . NO PAST SURGERIES       No outpatient medications have been marked as taking for the 04/27/19 encounter (Telemedicine) with Ezabella Teska, Rachelle Hora, MD.     Allergies:   Penicillins   Social History   Tobacco Use  . Smoking  status: Current Every Day Smoker    Packs/day: 0.50    Years: 19.00    Pack years: 9.50    Types: Cigarettes    Start date: 01/05/1996    Last attempt to quit: 03/04/2015    Years since quitting: 4.1  . Smokeless tobacco: Never Used  . Tobacco comment: Already decreasaed smoking  Substance Use Topics  . Alcohol use: No    Alcohol/week: 1.0 - 2.0 standard drinks    Types: 1 - 2 Standard drinks or equivalent per week    Comment: Occasional  . Drug use: No     Family Hx: The patient's family history includes Breast cancer in his maternal grandmother; Colon cancer in his maternal uncle; Diabetes in his maternal grandmother; Heart failure in his mother; Hyperlipidemia in his mother; Hypertension in his mother; Multiple sclerosis in his mother.  ROS:   Please see the history of present illness.     All other systems reviewed and are negative.   Prior CV studies:   The following studies were reviewed today:  Echo 10/22/2017 - Left ventricle: The cavity size was mildly dilated. Wall   thickness was normal. Systolic function was moderately reduced.   The estimated ejection fraction was in the range of 35% to 40%.   Diffuse hypokinesis. Doppler parameters are consistent with   abnormal left ventricular relaxation (grade 1 diastolic   dysfunction). - Ventricular septum: D-shaped interventricular septum suggestive   of RV pressure/volume overload. - Aortic valve: There was no stenosis. - Mitral valve: There was no significant regurgitation. - Right ventricle: Poorly visualized. The cavity size was   moderately dilated. Systolic function was moderately reduced. - Right atrium: Poorly visualized. - Tricuspid valve: Peak RV-RA gradient (S): 25 mm Hg. - Pulmonary arteries: PA peak pressure: 40 mm Hg (S). - Systemic veins: IVC measured 3 cm with < 50% respirophasic   variation, suggesting RA pressure 15 mmHg.  Impressions:  - Technically difficult study with poor acoustic windows.  Mildly   dilated LV with EF 35-40%, diffuse hypokinesis (LV was difficult   to evaluate even with Definity echo contrast). The RV was poorly   visualized. Probably moderately dilated with moderately decreased   systolic function. D-shaped interventricular septum suggestive of   RV pressure/volume overload. Mild pulmonary hypertension. Dilated   IVC suggestive of elevated RV filling pressure.  Labs/Other Tests and Data Reviewed:    EKG:  An ECG dated 01/22/2019 was personally reviewed today and demonstrated:  Sinus rhythm with right bundle branch block  Recent Labs: 06/03/2018: BUN 7; Creatinine, Ser 1.01; Potassium 4.3; Sodium 141   Recent Lipid Panel Lab Results  Component Value Date/Time   CHOL 166 03/06/2015 01:55 AM   TRIG 248 (H) 03/06/2015 01:55 AM   HDL 42 03/06/2015 01:55 AM   CHOLHDL 4.0 03/06/2015 01:55 AM   LDLCALC 74 03/06/2015 01:55 AM    Wt Readings from Last 3 Encounters:  01/22/19 (!) 358  lb (162.4 kg)  06/03/18 (!) 350 lb (158.8 kg)  04/28/18 (!) 365 lb (165.6 kg)     Objective:    Vital Signs:  There were no vitals taken for this visit.   VITAL SIGNS:  reviewed Unable to examine  ASSESSMENT & PLAN:    1. CHF: Appears to have manifestations of acute on chronic biventricular failure, but is improving on the higher dose of diuretic.  Reviewed the importance of sodium restricted diet, as noncompliance with this recommendation is the most likely cause for his current decompensation.  Discussed again the concept of "dry weight" and how important is for him to monitor his weight daily.  Asked him to call me back with his weight as soon as he returns home.  As I can tell his "dry weight" is 350-358 pounds.  In the past he has presented with as much as 50 pound fluid overload. 2. Malignant HTN: Unable to check today but he thinks it has been "okay".  I encouraged him to purchase a blood pressure cuff. 3. Morbid obesity: It is highly likely that he will have further  complications and a shortened life span if he does not lose substantial weight. 4. OSA: Reminded him that he should use CPAP 100% of the time 5. Second degree AV block Mobitz type I: No symptomatic bradycardia, likely related to obstructive sleep apnea. 6. NSVT: Asymptomatic, detected on previous telemetry.  No syncope or near syncope.  COVID-19 Education: The signs and symptoms of COVID-19 were discussed with the patient and how to seek care for testing (follow up with PCP or arrange E-visit).  The importance of social distancing was discussed today.  Time:   Today, I have spent 19 minutes with the patient with telehealth technology discussing the above problems.     Medication Adjustments/Labs and Tests Ordered: Current medicines are reviewed at length with the patient today.  Concerns regarding medicines are outlined above.   Tests Ordered: No orders of the defined types were placed in this encounter.   Medication Changes: No orders of the defined types were placed in this encounter.   Disposition:  Follow up 3-4 weeks. Will need updated weight and BP at that visit.  Signed, Thurmon Fair, MD  04/27/2019 1:31 PM    [facility]

## 2019-04-27 NOTE — Telephone Encounter (Signed)
New Message    Pt is calling because he said he is feeling a little better after increasing his Furosemide.  He is wondering if he needs to remain out of work until his appt on Thursday.     Please call

## 2019-04-28 NOTE — Telephone Encounter (Signed)
Out of work letter has been mailed to the patient.

## 2019-04-28 NOTE — Telephone Encounter (Signed)
Follow up   Patient is calling to advise Dr. Royann Shivers that he weighs 365.

## 2019-04-28 NOTE — Telephone Encounter (Signed)
Patient had visit yesterday-  Calling to advise of his weight.  Will route to MD and nurse.

## 2019-04-28 NOTE — Telephone Encounter (Signed)
Thank you. That means he is still 7-15 lb over ideal fluid weight, whic is why he is still short of breath. Yesterday note instructions still apply.

## 2019-04-30 ENCOUNTER — Ambulatory Visit: Payer: Managed Care, Other (non HMO) | Admitting: Cardiovascular Disease

## 2019-05-04 ENCOUNTER — Telehealth: Payer: Self-pay | Admitting: Cardiovascular Disease

## 2019-05-04 NOTE — Telephone Encounter (Signed)
Patient needs letter for being out of work since May 5th. Previous note states that we sent the letter but patient states he has not received it. He needs it to go back to work. He was supposed to go back today.  Please email note to [email or web address]

## 2019-05-04 NOTE — Telephone Encounter (Signed)
Spoke with pt and he will try and access Mychart and see if can get letter and if does get access may try and email thru Mychart.

## 2019-05-05 ENCOUNTER — Encounter: Payer: Self-pay | Admitting: Cardiovascular Disease

## 2019-05-05 ENCOUNTER — Telehealth: Payer: Self-pay | Admitting: Cardiovascular Disease

## 2019-05-05 NOTE — Telephone Encounter (Signed)
Left a message with the patient to verify that he could access the new letter on MyChart.

## 2019-05-05 NOTE — Telephone Encounter (Signed)
Fine w me MCr

## 2019-05-05 NOTE — Telephone Encounter (Signed)
Pt is calling to follow up about his return to work note.

## 2019-05-05 NOTE — Telephone Encounter (Signed)
The patient called back to state that he did get the letter.

## 2019-05-05 NOTE — Telephone Encounter (Signed)
Follow Up:   Please call, pt needs to tell you what he needs in his letter to return to work please.

## 2019-05-05 NOTE — Telephone Encounter (Signed)
Call placed to the patient to confirm his address which is verified as the same address that the letter was mailed to on 04/28/2019.   He stated that he never received the letter and would like another letter to be sent to MyChart with a return to work on 05/06/2019. He will show this letter to his employer tomorrow.

## 2019-05-05 NOTE — Telephone Encounter (Signed)
Spoke with pt who state he never received his return to work letter and because of that, was unable to return to work on the 5/18. Pt requesting if Dr. Salena Saner could update letter stating  return to work on 5/20 instead. Will route to MD for approval.

## 2019-05-08 ENCOUNTER — Telehealth: Payer: Self-pay | Admitting: Cardiovascular Disease

## 2019-05-08 NOTE — Telephone Encounter (Signed)
lmsg for patient.  Needs fu appt in 2-3 weeks (first week in June) with an APP.  Please schedule. Thanks

## 2019-05-13 ENCOUNTER — Other Ambulatory Visit: Payer: Self-pay

## 2019-05-13 MED ORDER — ISOSORB DINITRATE-HYDRALAZINE 20-37.5 MG PO TABS: 1.0000 | ORAL_TABLET | Freq: Three times a day (TID) | ORAL | 3 refills | Status: DC

## 2019-05-22 ENCOUNTER — Telehealth: Payer: Self-pay

## 2019-05-22 NOTE — Telephone Encounter (Signed)
05/22/2019 Patient walked in with FMLA Form from [employer]. And signed release form and brought a 25.00 check.  I then inter-office it to [employer] to process.  cbr

## 2019-05-25 ENCOUNTER — Telehealth: Payer: Self-pay | Admitting: Cardiovascular Disease

## 2019-05-25 NOTE — Telephone Encounter (Signed)
Thanks. Agree with recommendations, hard to manage without weight monitoring.

## 2019-05-25 NOTE — Telephone Encounter (Signed)
smartphone/ consent/ my chart/ pre reg completed °

## 2019-05-25 NOTE — Telephone Encounter (Signed)
Spoke with patient and he has noticed swelling in his abdomen and upper legs. He does not weigh himself at home. He has financial difficulties with regards to purchasing scales. I have sent a message to [employer] and [place] worker to seek assistance with getting patient scales for home use. He feels heavy and does not think he will be able to work with swelling. He is having shortness of breath with exertion but does not feel it is bad enough to go to ED. Advised patient to increase his Lasix to 80 mg twice a day for 2 days, call back if no improvement or worse. If at any time he feels needs to go to ED then proceed. Will forward to Dr Sallyanne Kuster regarding and for recommendations on work.

## 2019-05-25 NOTE — Telephone Encounter (Signed)
Pt c/o swelling: STAT is pt has developed SOB within 24 hours  1) How much weight have you gained and in what time span? Pt have not taken his weight  2) If swelling, where is the swelling located? Stomach, lower and upper legs  3) Are you currently taking a fluid pill? Yes  4) Are you currently SOB?  when he walks  5) Do you have a log of your daily weights (if so, list)?no  6) Have you gained 3 pounds in a day or 5 pounds in a week? He does not know, he does not weigh   Have you traveled recently? no

## 2019-05-25 NOTE — Telephone Encounter (Addendum)
Left message to call back  As for work Dr Sallyanne Kuster said he could do note after visit Friday

## 2019-05-29 ENCOUNTER — Telehealth (INDEPENDENT_AMBULATORY_CARE_PROVIDER_SITE_OTHER): Payer: Managed Care, Other (non HMO) | Admitting: Cardiovascular Disease

## 2019-05-29 ENCOUNTER — Inpatient Hospital Stay (HOSPITAL_COMMUNITY)
Admission: EM | Admit: 2019-05-29 | Discharge: 2019-06-04 | DRG: 286 | Disposition: A | Discharge status: Home / Self Care | Payer: Managed Care, Other (non HMO) | Attending: Internal Medicine | Admitting: Internal Medicine

## 2019-05-29 ENCOUNTER — Other Ambulatory Visit: Payer: Self-pay

## 2019-05-29 ENCOUNTER — Emergency Department (HOSPITAL_COMMUNITY): Payer: Managed Care, Other (non HMO)

## 2019-05-29 ENCOUNTER — Encounter (HOSPITAL_COMMUNITY): Payer: Self-pay | Admitting: Emergency Medicine

## 2019-05-29 ENCOUNTER — Encounter: Payer: Self-pay | Admitting: Cardiovascular Disease

## 2019-05-29 DIAGNOSIS — Z880 Allergy status to penicillin: Secondary | ICD-10-CM

## 2019-05-29 DIAGNOSIS — E1122 Type 2 diabetes mellitus with diabetic chronic kidney disease: Secondary | ICD-10-CM | POA: Diagnosis present

## 2019-05-29 DIAGNOSIS — N183 Chronic kidney disease, stage 3 unspecified: Secondary | ICD-10-CM | POA: Diagnosis present

## 2019-05-29 DIAGNOSIS — R0602 Shortness of breath: Secondary | ICD-10-CM | POA: Diagnosis not present

## 2019-05-29 DIAGNOSIS — I110 Hypertensive heart disease with heart failure: Secondary | ICD-10-CM

## 2019-05-29 DIAGNOSIS — I420 Dilated cardiomyopathy: Secondary | ICD-10-CM

## 2019-05-29 DIAGNOSIS — M109 Gout, unspecified: Secondary | ICD-10-CM | POA: Diagnosis present

## 2019-05-29 DIAGNOSIS — I5043 Acute on chronic combined systolic (congestive) and diastolic (congestive) heart failure: Secondary | ICD-10-CM | POA: Diagnosis present

## 2019-05-29 DIAGNOSIS — I5082 Biventricular heart failure: Secondary | ICD-10-CM | POA: Diagnosis present

## 2019-05-29 DIAGNOSIS — E1169 Type 2 diabetes mellitus with other specified complication: Secondary | ICD-10-CM

## 2019-05-29 DIAGNOSIS — I2721 Secondary pulmonary arterial hypertension: Secondary | ICD-10-CM | POA: Diagnosis present

## 2019-05-29 DIAGNOSIS — I428 Other cardiomyopathies: Secondary | ICD-10-CM | POA: Diagnosis present

## 2019-05-29 DIAGNOSIS — I5041 Acute combined systolic (congestive) and diastolic (congestive) heart failure: Secondary | ICD-10-CM | POA: Diagnosis present

## 2019-05-29 DIAGNOSIS — Z7951 Long term (current) use of inhaled steroids: Secondary | ICD-10-CM

## 2019-05-29 DIAGNOSIS — J9601 Acute respiratory failure with hypoxia: Secondary | ICD-10-CM | POA: Diagnosis present

## 2019-05-29 DIAGNOSIS — E119 Type 2 diabetes mellitus without complications: Secondary | ICD-10-CM

## 2019-05-29 DIAGNOSIS — Z803 Family history of malignant neoplasm of breast: Secondary | ICD-10-CM

## 2019-05-29 DIAGNOSIS — Z79899 Other long term (current) drug therapy: Secondary | ICD-10-CM

## 2019-05-29 DIAGNOSIS — G4733 Obstructive sleep apnea (adult) (pediatric): Secondary | ICD-10-CM

## 2019-05-29 DIAGNOSIS — R600 Localized edema: Secondary | ICD-10-CM

## 2019-05-29 DIAGNOSIS — I4729 Other ventricular tachycardia: Secondary | ICD-10-CM

## 2019-05-29 DIAGNOSIS — I130 Hypertensive heart and chronic kidney disease with heart failure and stage 1 through stage 4 chronic kidney disease, or unspecified chronic kidney disease: Principal | ICD-10-CM | POA: Diagnosis present

## 2019-05-29 DIAGNOSIS — J45909 Unspecified asthma, uncomplicated: Secondary | ICD-10-CM | POA: Diagnosis present

## 2019-05-29 DIAGNOSIS — Z833 Family history of diabetes mellitus: Secondary | ICD-10-CM

## 2019-05-29 DIAGNOSIS — I441 Atrioventricular block, second degree: Secondary | ICD-10-CM | POA: Diagnosis present

## 2019-05-29 DIAGNOSIS — Z9119 Patient's noncompliance with other medical treatment and regimen: Secondary | ICD-10-CM

## 2019-05-29 DIAGNOSIS — Z720 Tobacco use: Secondary | ICD-10-CM | POA: Diagnosis present

## 2019-05-29 DIAGNOSIS — Z8249 Family history of ischemic heart disease and other diseases of the circulatory system: Secondary | ICD-10-CM

## 2019-05-29 DIAGNOSIS — I2781 Cor pulmonale (chronic): Secondary | ICD-10-CM | POA: Diagnosis present

## 2019-05-29 DIAGNOSIS — Z6841 Body Mass Index (BMI) 40.0 and over, adult: Secondary | ICD-10-CM

## 2019-05-29 DIAGNOSIS — F17210 Nicotine dependence, cigarettes, uncomplicated: Secondary | ICD-10-CM | POA: Diagnosis present

## 2019-05-29 DIAGNOSIS — I10 Essential (primary) hypertension: Secondary | ICD-10-CM

## 2019-05-29 DIAGNOSIS — Z8349 Family history of other endocrine, nutritional and metabolic diseases: Secondary | ICD-10-CM

## 2019-05-29 DIAGNOSIS — Z20828 Contact with and (suspected) exposure to other viral communicable diseases: Secondary | ICD-10-CM | POA: Diagnosis present

## 2019-05-29 DIAGNOSIS — I472 Ventricular tachycardia: Secondary | ICD-10-CM

## 2019-05-29 DIAGNOSIS — Z820 Family history of epilepsy and other diseases of the nervous system: Secondary | ICD-10-CM

## 2019-05-29 DIAGNOSIS — Z800 Family history of malignant neoplasm of digestive organs: Secondary | ICD-10-CM

## 2019-05-29 LAB — CBC
HCT: 52 % (ref 39.0–52.0)
Hemoglobin: 15.7 g/dL (ref 13.0–17.0)
MCH: 26.1 pg (ref 26.0–34.0)
MCHC: 30.2 g/dL (ref 30.0–36.0)
MCV: 86.4 fL (ref 80.0–100.0)
Platelets: 290 10*3/uL (ref 150–400)
RBC: 6.02 MIL/uL — ABNORMAL HIGH (ref 4.22–5.81)
RDW: 18.3 % — ABNORMAL HIGH (ref 11.5–15.5)
WBC: 6.8 10*3/uL (ref 4.0–10.5)
nRBC: 0 % (ref 0.0–0.2)

## 2019-05-29 LAB — BRAIN NATRIURETIC PEPTIDE: B Natriuretic Peptide: 499.5 pg/mL — ABNORMAL HIGH (ref 0.0–100.0)

## 2019-05-29 LAB — BASIC METABOLIC PANEL
Anion gap: 12 (ref 5–15)
BUN: 21 mg/dL — ABNORMAL HIGH (ref 6–20)
CO2: 31 mmol/L (ref 22–32)
Calcium: 8.5 mg/dL — ABNORMAL LOW (ref 8.9–10.3)
Chloride: 99 mmol/L (ref 98–111)
Creatinine, Ser: 1.23 mg/dL (ref 0.61–1.24)
GFR calc Af Amer: >60 mL/min (ref >60)
GFR calc non Af Amer: >60 mL/min (ref >60)
Glucose, Bld: 135 mg/dL — ABNORMAL HIGH (ref 70–99)
Potassium: 4.3 mmol/L (ref 3.5–5.1)
Sodium: 142 mmol/L (ref 135–145)

## 2019-05-29 LAB — MAGNESIUM: Magnesium: 2 mg/dL (ref 1.7–2.4)

## 2019-05-29 LAB — SARS CORONAVIRUS 2 BY RT PCR (HOSPITAL ORDER, PERFORMED IN ~~LOC~~ HOSPITAL LAB): SARS Coronavirus 2: NEGATIVE

## 2019-05-29 LAB — TROPONIN I: Troponin I: <0.03 ng/mL (ref <0.03)

## 2019-05-29 MED ORDER — DM-GUAIFENESIN ER 30-600 MG PO TB12: 1.0000 | ORAL_TABLET | Freq: Two times a day (BID) | ORAL | Status: DC | PRN

## 2019-05-29 MED ORDER — ACETAMINOPHEN 325 MG PO TABS: 650.0000 mg | ORAL_TABLET | ORAL | Status: DC | PRN

## 2019-05-29 MED ORDER — SODIUM CHLORIDE 0.9% FLUSH: 3.0000 mL | INTRAVENOUS | Status: DC | PRN

## 2019-05-29 MED ORDER — ASPIRIN EC 81 MG PO TBEC
81.0000 mg | DELAYED_RELEASE_TABLET | Freq: Every day | ORAL | Status: DC
  Administered 2019-05-29 – 2019-06-04 (×6): 81 mg via ORAL
  Filled 2019-05-29 (×6): qty 1

## 2019-05-29 MED ORDER — NICOTINE 21 MG/24HR TD PT24
21.0000 mg | MEDICATED_PATCH | Freq: Every day | TRANSDERMAL | Status: DC
  Administered 2019-05-30 – 2019-06-04 (×6): 21 mg via TRANSDERMAL
  Filled 2019-05-29 (×6): qty 1

## 2019-05-29 MED ORDER — FUROSEMIDE 10 MG/ML IJ SOLN
80.0000 mg | Freq: Two times a day (BID) | INTRAMUSCULAR | Status: DC
  Administered 2019-05-30 – 2019-05-31 (×3): 80 mg via INTRAVENOUS
  Filled 2019-05-29 (×3): qty 8

## 2019-05-29 MED ORDER — COLCHICINE 0.6 MG PO TABS
0.6000 mg | ORAL_TABLET | Freq: Every day | ORAL | Status: DC
  Administered 2019-05-30 – 2019-06-03 (×5): 0.6 mg via ORAL
  Filled 2019-05-29 (×6): qty 1

## 2019-05-29 MED ORDER — HYDRALAZINE HCL 20 MG/ML IJ SOLN: 5.0000 mg | INTRAMUSCULAR | Status: DC | PRN

## 2019-05-29 MED ORDER — LEVALBUTEROL TARTRATE 45 MCG/ACT IN AERO: 2.0000 | INHALATION_SPRAY | Freq: Four times a day (QID) | RESPIRATORY_TRACT | Status: DC | PRN

## 2019-05-29 MED ORDER — FUROSEMIDE 10 MG/ML IJ SOLN
80.0000 mg | Freq: Once | INTRAMUSCULAR | Status: AC
  Administered 2019-05-29: 80 mg via INTRAVENOUS
  Filled 2019-05-29: qty 8

## 2019-05-29 MED ORDER — SPIRONOLACTONE 25 MG PO TABS
50.0000 mg | ORAL_TABLET | Freq: Every day | ORAL | Status: DC
  Administered 2019-05-30 – 2019-06-04 (×6): 50 mg via ORAL
  Filled 2019-05-29 (×6): qty 2

## 2019-05-29 MED ORDER — CARVEDILOL 12.5 MG PO TABS
12.5000 mg | ORAL_TABLET | Freq: Two times a day (BID) | ORAL | Status: DC
  Administered 2019-05-29 – 2019-06-04 (×11): 12.5 mg via ORAL
  Filled 2019-05-29 (×12): qty 1

## 2019-05-29 MED ORDER — ENOXAPARIN SODIUM 40 MG/0.4ML ~~LOC~~ SOLN
40.0000 mg | SUBCUTANEOUS | Status: DC
  Administered 2019-05-29 – 2019-05-31 (×3): 40 mg via SUBCUTANEOUS
  Filled 2019-05-29 (×3): qty 0.4

## 2019-05-29 MED ORDER — SODIUM CHLORIDE 0.9% FLUSH
3.0000 mL | Freq: Two times a day (BID) | INTRAVENOUS | Status: DC
  Administered 2019-05-30 – 2019-06-04 (×9): 3 mL via INTRAVENOUS

## 2019-05-29 MED ORDER — ALBUTEROL SULFATE (2.5 MG/3ML) 0.083% IN NEBU: 2.5000 mg | INHALATION_SOLUTION | Freq: Four times a day (QID) | RESPIRATORY_TRACT | Status: DC | PRN

## 2019-05-29 MED ORDER — ISOSORB DINITRATE-HYDRALAZINE 20-37.5 MG PO TABS
1.0000 | ORAL_TABLET | Freq: Three times a day (TID) | ORAL | Status: DC
  Administered 2019-05-29 – 2019-06-04 (×16): 1 via ORAL
  Filled 2019-05-29 (×17): qty 1

## 2019-05-29 MED ORDER — SACUBITRIL-VALSARTAN 97-103 MG PO TABS
1.0000 | ORAL_TABLET | Freq: Two times a day (BID) | ORAL | Status: DC
  Administered 2019-05-30 – 2019-06-04 (×9): 1 via ORAL
  Filled 2019-05-29 (×12): qty 1

## 2019-05-29 MED ORDER — SODIUM CHLORIDE 0.9 % IV SOLN: 250.0000 mL | INTRAVENOUS | Status: DC | PRN

## 2019-05-29 NOTE — H&P (Addendum)
History and Physical    Roger BasqueJerome L Latella ZOX:[id number] DOB: 10/06/1981 DOA: 05/29/2019  Referring MD/NP/PA:   PCP: Veryl Speakalone, Gregory D, FNP   Patient coming from:  The patient is coming from home.  At baseline, pt is independent for most of ADL.        Chief Complaint: SOB  HPI: Roger Ward is a 38 y.o. male with medical history significant of CHF with EF 35-40%, hypertension, diet-controlled diabetes, OSA on CPAP, morbid obesity, tobacco abuse, gout, who presents with shortness of breath.  Patient states that he has been having shortness of breath for several days, which has been progressively worsening.  He has gained approximately 15 pounds recently.  He has worsening bilateral leg edema, and also abdominal and genital area edema.  Denies chest pain, no fever or chills.  Patient was found to have oxygen desaturation to 87% on room air in ED. No nausea vomiting, diarrhea, abdominal pain, symptoms of UTI or unilateral weakness. Pt was seen by his cardiologist today, and is seen here for IV diuretics. He is taking Lasix 80 mg twice a day at home.   ED Course: pt was found to have BNP 499, negative troponin, stable renal function, negative COVID-19 test, temperature normal, slightly tachycardia, oxygen saturation 87% on room air, chest x-ray showed cardiomegaly. Patient is placed on telemetry bed for observation.  Review of Systems:   General: no fevers, chills, has body weight gain, has fatigue HEENT: no blurry vision, hearing changes or sore throat Respiratory: has dyspnea, coughing, no wheezing CV: no chest pain, no palpitations GI: no nausea, vomiting, abdominal pain, diarrhea, constipation  GU: no dysuria, burning on urination, increased urinary frequency, hematuria  Ext: has leg edema Neuro: no unilateral weakness, numbness, or tingling, no vision change or hearing loss Skin: no rash, no skin tear. MSK: No muscle spasm, no deformity, no limitation of range of movement in spin Heme:  No easy bruising.  Travel history: No recent long distant travel.  Allergy:  Allergies  Allergen Reactions  . Penicillins Swelling    Past Medical History:  Diagnosis Date  . 2nd degree AV block    a. noted during 02/2015 admission - second degree AV AV block (not further defined in notes). Not on BB due to this.  . Asthma    Childhood  . Bradycardia    a. noted during 02/2015 admission - second degree AV AV block (not further defined in notes). Not on BB due to this.  . Chronic combined systolic and diastolic CHF (congestive heart failure) (HCC)    a. echo 03/06/15 showing mild LVH, EF 40%, high ventricular filling pressures, mod MR, mildly dilated RV/mildly reduced RV function, mildly dilated RA, PASP 45mmHg.  . Diabetes mellitus, type 2 (HCC)   . Essential hypertension   . Family history of adverse reaction to anesthesia    mother and aunt  both put to sleep for surgery and "stopped breathing"  . Morbid obesity (HCC)   . NSVT (nonsustained ventricular tachycardia) (HCC)    a. noted during 02/2015 admission.  . OSA (obstructive sleep apnea)    a. noncompliance with CPAP - wakes up with it off.    Past Surgical History:  Procedure Laterality Date  . NO PAST SURGERIES      Social History:  reports that he has been smoking cigarettes. He started smoking about 23 years ago. He has a 9.50 pack-year smoking history. He has never used smokeless tobacco. He reports that he does  not drink alcohol or use drugs.  Family History:  Family History  Problem Relation Age of Onset  . Hypertension Mother   . Hyperlipidemia Mother   . Heart failure Mother   . Multiple sclerosis Mother   . Breast cancer Maternal Grandmother   . Diabetes Maternal Grandmother   . Colon cancer Maternal Uncle      Prior to Admission medications   Medication Sig Start Date End Date Taking? Authorizing Provider  carvedilol (COREG) 12.5 MG tablet TAKE 1 TABLET (12.5 MG TOTAL) BY MOUTH 2 (TWO) TIMES DAILY.  04/13/19 07/12/19  Abelino Derrick, PA-C  cetirizine (ZYRTEC) 10 MG tablet Take 1 tablet (10 mg total) by mouth daily. 08/07/17   Mikell, Antionette Poles, MD  colchicine 0.6 MG tablet Take 1 tablet (0.6 mg total) by mouth daily. 03/02/19   Cheron Schaumann K, PA-C  ENTRESTO 97-103 MG TAKE 1 TABLET BY MOUTH TWICE A DAY 04/13/19   Croitoru, Mihai, MD  fluticasone (FLONASE) 50 MCG/ACT nasal spray Place 1 spray into both nostrils daily. 1 spray in each nostril every day 12/15/18   Eustace Moore, MD  furosemide (LASIX) 80 MG tablet Take 1 tablet (80 mg total) by mouth daily. 04/27/19   Croitoru, Mihai, MD  isosorbide-hydrALAZINE (BIDIL) 20-37.5 MG tablet Take 1 tablet by mouth 3 (three) times daily. 05/13/19   Croitoru, Mihai, MD  spironolactone (ALDACTONE) 50 MG tablet TAKE 1 TABLET BY MOUTH EVERY DAY 04/10/19   Croitoru, Rachelle Hora, MD    Physical Exam: Vitals:   05/29/19 1900 05/29/19 2027 05/29/19 2215 05/29/19 2309  BP: 114/86 127/62 127/76   Pulse: 85 91 79 90  Resp: 13 18  18   Temp:  98.6 F (37 C)    TempSrc:  Oral    SpO2: 100% 96%  95%  Weight:  (!) 177.4 kg    Height:  6\' 1"  (1.854 m)     General: Not in acute distress HEENT:       Eyes: PERRL, EOMI, no scleral icterus.       ENT: No discharge from the ears and nose, no pharynx injection, no tonsillar enlargement.        Neck: Difficult to assess JVD due to obesity, no bruit, no mass felt. Heme: No neck lymph node enlargement. Cardiac: S1/S2, RRR, No murmurs, No gallops or rubs. Respiratory: Slightly decreased air movement bilaterally. GI: Soft, nondistended, nontender, no rebound pain, no organomegaly, BS present. GU: No hematuria Ext: has 2+ pitting leg edema bilaterally. 2+DP/PT pulse bilaterally. Musculoskeletal: No joint deformities, No joint redness or warmth, no limitation of ROM in spin. Skin: No rashes.  Neuro: Alert, oriented X3, cranial nerves II-XII grossly intact, moves all extremities normally.  Psych: Patient is not  psychotic, no suicidal or hemocidal ideation.  Labs on Admission: I have personally reviewed following labs and imaging studies  CBC: Recent Labs  Lab 05/29/19 1710  WBC 6.8  HGB 15.7  HCT 52.0  MCV 86.4  PLT 290   Basic Metabolic Panel: Recent Labs  Lab 05/29/19 1710  NA 142  K 4.3  CL 99  CO2 31  GLUCOSE 135*  BUN 21*  CREATININE 1.23  CALCIUM 8.5*  MG 2.0   GFR: Estimated Creatinine Clearance: 138.3 mL/min (by C-G formula based on SCr of 1.23 mg/dL). Liver Function Tests: No results for input(s): AST, ALT, ALKPHOS, BILITOT, PROT, ALBUMIN in the last 168 hours. No results for input(s): LIPASE, AMYLASE in the last 168 hours. No results for input(s):  AMMONIA in the last 168 hours. Coagulation Profile: No results for input(s): INR, PROTIME in the last 168 hours. Cardiac Enzymes: Recent Labs  Lab 05/29/19 1710  TROPONINI <0.03   BNP (last 3 results) No results for input(s): PROBNP in the last 8760 hours. HbA1C: No results for input(s): HGBA1C in the last 72 hours. CBG: No results for input(s): GLUCAP in the last 168 hours. Lipid Profile: No results for input(s): CHOL, HDL, LDLCALC, TRIG, CHOLHDL, LDLDIRECT in the last 72 hours. Thyroid Function Tests: No results for input(s): TSH, T4TOTAL, FREET4, T3FREE, THYROIDAB in the last 72 hours. Anemia Panel: No results for input(s): VITAMINB12, FOLATE, FERRITIN, TIBC, IRON, RETICCTPCT in the last 72 hours. Urine analysis:    Component Value Date/Time   COLORURINE YELLOW 03/05/2015 1155   APPEARANCEUR CLEAR 03/05/2015 1155   LABSPEC 1.015 03/17/2018 1514   PHURINE 5.5 03/17/2018 1514   GLUCOSEU NEGATIVE 03/17/2018 1514   HGBUR NEGATIVE 03/17/2018 1514   BILIRUBINUR NEGATIVE 03/17/2018 1514   KETONESUR NEGATIVE 03/17/2018 1514   PROTEINUR 100 (A) 03/17/2018 1514   UROBILINOGEN 0.2 03/17/2018 1514   NITRITE NEGATIVE 03/17/2018 1514   LEUKOCYTESUR NEGATIVE 03/17/2018 1514   Sepsis Labs:  @LABRCNTIP (procalcitonin:4,lacticidven:4) ) Recent Results (from the past 240 hour(s))  SARS Coronavirus 2 (CEPHEID- Performed in [place] hospital lab), Hosp Order     Status: None   Collection Time: 05/29/19  5:23 PM   Specimen: Nasopharyngeal Swab  Result Value Ref Range Status   SARS Coronavirus 2 NEGATIVE NEGATIVE Final    Comment: (NOTE) If result is NEGATIVE SARS-CoV-2 target nucleic acids are NOT DETECTED. The SARS-CoV-2 RNA is generally detectable in upper and lower  respiratory specimens during the acute phase of infection. The lowest  concentration of SARS-CoV-2 viral copies this assay can detect is 250  copies / mL. A negative result does not preclude SARS-CoV-2 infection  and should not be used as the sole basis for treatment or other  patient management decisions.  A negative result may occur with  improper specimen collection / handling, submission of specimen other  than nasopharyngeal swab, presence of viral mutation(s) within the  areas targeted by this assay, and inadequate number of viral copies  (<250 copies / mL). A negative result must be combined with clinical  observations, patient history, and epidemiological information. If result is POSITIVE SARS-CoV-2 target nucleic acids are DETECTED. The SARS-CoV-2 RNA is generally detectable in upper and lower  respiratory specimens dur ing the acute phase of infection.  Positive  results are indicative of active infection with SARS-CoV-2.  Clinical  correlation with patient history and other diagnostic information is  necessary to determine patient infection status.  Positive results do  not rule out bacterial infection or co-infection with other viruses. If result is PRESUMPTIVE POSTIVE SARS-CoV-2 nucleic acids MAY BE PRESENT.   A presumptive positive result was obtained on the submitted specimen  and confirmed on repeat testing.  While 2019 novel coronavirus  (SARS-CoV-2) nucleic acids may be present in the  submitted sample  additional confirmatory testing may be necessary for epidemiological  and / or clinical management purposes  to differentiate between  SARS-CoV-2 and other Sarbecovirus currently known to infect humans.  If clinically indicated additional testing with an alternate test  methodology [phone]) is advised. The SARS-CoV-2 RNA is generally  detectable in upper and lower respiratory sp ecimens during the acute  phase of infection. The expected result is Negative. Fact Sheet for Patients:  StrictlyIdeas.no Fact Sheet for Healthcare  Providers: https://pope.com/https://www.fda.gov/media/136313/download This test is not yet approved or cleared by the [place] FDA and has been authorized for detection and/or diagnosis of SARS-CoV-2 by FDA under an Emergency Use Authorization (EUA).  This EUA will remain in effect (meaning this test can be used) for the duration of the COVID-19 declaration under Section 564(b)(1) of the Act, 21 U.S.C. section 360bbb-3(b)(1), unless the authorization is terminated or revoked sooner. Performed at [facility], [address] [place], [place] [address]      Radiological Exams on Admission: Dg Chest Portable 1 View  Result Date: 05/29/2019 CLINICAL DATA:  Swelling. Shortness of breath today. History of CHF and hypertension. EXAM: PORTABLE CHEST 1 VIEW COMPARISON:  10/21/2017 FINDINGS: Mild enlargement of the cardiopericardial silhouette, stable. No mediastinal or hilar masses. No convincing adenopathy. Lungs are clear.  No pleural effusion or pneumothorax. Skeletal structures are grossly intact. IMPRESSION: 1. No acute cardiopulmonary disease. 2. Stable cardiomegaly. Electronically Signed   By: Amie Portlandavid  Ormond M.D.   On: 05/29/2019 18:00     EKG: Independently reviewed.  Sinus rhythm, QTC 515, low voltage, early R wave progression, bifascicular block  Assessment/Plan Principal Problem:   Acute combined systolic and diastolic  congestive heart failure (HCC) Active Problems:   OSA (obstructive sleep apnea)   Tobacco abuse   Acute hypoxemic respiratory failure (HCC)   Chronic renal insufficiency, stage III (moderate) (HCC)   Type II diabetes mellitus with renal manifestations (HCC)   Acute hypoxemic respiratory failure due to acute combined systolic and diastolic congestive heart failure [facility](HCC): Patient has bilateral 2+ leg edema, elevated BNP 499, and shortness of breath, weight gain 15 pounds, consistent with CHF exacerbation. No CP.  2D echo on 10/22/2017 showed EF of 35-40% with grade 1 diastolic dysfunction.  -will place on tele bed for obs -Lasix 80 mg bid by IV - on Entresto -2d echo -Daily weights -strict I/O's -Low salt diet -Fluid restriction -prn albuterol for SOB   OSA (obstructive sleep apnea): -CPAP  Tobacco abuse: -Did counseling about importance of quitting smoking -Nicotine patch  Chronic renal insufficiency, stage III (moderate) (HCC): Stable.  Creatinine 1.23, BUN 21, GFR>60 -f/u by CBC  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  Blood sugar 135 today -check CBG qAM    DVT ppx:  SQ Lovenox Code Status: Full code Family Communication: None at bed side.       Disposition Plan:  Anticipate discharge back to previous home environment Consults called:  none Admission status: Obs / tele   Date of Service 05/30/2019    Lorretta HarpXilin Thana Ramp [facility]   If 7PM-7AM, please contact night-coverage [email or web address] Password [email or web address] 05/30/2019, 3:18 AM

## 2019-05-29 NOTE — Patient Instructions (Signed)
Medication Instructions:  Your physician recommends that you continue on your current medications as directed. Please refer to the Current Medication list given to you today.  If you need a refill on your cardiac medications before your next appointment, please call your pharmacy.   Your provider has recommended that you go to the Emergency Department for further assessment today.  Follow-Up:  Follow up in 2-4 weeks with an APP (Physician Assistant)

## 2019-05-29 NOTE — ED Provider Notes (Addendum)
[facility] Provider Note   CSN: [id number] Arrival date & time: 05/29/19  1643    History   Chief Complaint Chief Complaint  Patient presents with  . Congestive Heart Failure    HPI Roger Ward is a 38 y.o. male with a PMH of CHF, T2DM, Morbid obesity, NSVT, OSA, and malignant hypertension presenting with constant shortness of breath and bilateral leg edema onset 5 days ago. Patient reports shortness of breath is worse with exertion. Patient states he has been compliant with his medications and has been taking 80mg  of Lasix BID. Patient reports he was evaluated virtually by his cardiologist, Dr. Royann Shiversroitoru, and was advised to come to the ER. Patient reports weight gain, but he is unsure of the exact amount. Patient reports abdominal distention and genital edema. Patient reports an intermittent dry cough. Patient denies fever, chest pain, syncope, nausea, vomiting, diarrhea, abdominal pain, sick contacts, or recent travel. Patient denies orthopnea. Per chart review, patient has LV EF 40% due to cor pulmonale. Patient denies using oxygen at home, but states he uses a CPAP while sleeping. Patient reports tobacco use and occasional alcohol use. Patient denies drug use.      HPI  Past Medical History:  Diagnosis Date  . 2nd degree AV block    a. noted during 02/2015 admission - second degree AV AV block (not further defined in notes). Not on BB due to this.  . Asthma    Childhood  . Bradycardia    a. noted during 02/2015 admission - second degree AV AV block (not further defined in notes). Not on BB due to this.  . Chronic combined systolic and diastolic CHF (congestive heart failure) (HCC)    a. echo 03/06/15 showing mild LVH, EF 40%, high ventricular filling pressures, mod MR, mildly dilated RV/mildly reduced RV function, mildly dilated RA, PASP 45mmHg.  . Diabetes mellitus, type 2 (HCC)   . Essential hypertension   . Family history of adverse  reaction to anesthesia    mother and aunt  both put to sleep for surgery and "stopped breathing"  . Morbid obesity (HCC)   . NSVT (nonsustained ventricular tachycardia) (HCC)    a. noted during 02/2015 admission.  . OSA (obstructive sleep apnea)    a. noncompliance with CPAP - wakes up with it off.    Patient Active Problem List   Diagnosis Date Noted  . Hypertensive cardiomegaly with heart failure (HCC) 11/12/2017  . Chronic renal insufficiency, stage III (moderate) (HCC) 11/12/2017  . Acute hypoxemic respiratory failure (HCC) 10/22/2017  . Tobacco abuse 01/23/2016  . Second degree AV block, Mobitz type I   . NSVT (nonsustained ventricular tachycardia) (HCC)   . Diabetes mellitus, type 2 (HCC)   . Morbid obesity (HCC)   . Cardiomyopathy (HCC) 03/08/2015  . Acute combined systolic and diastolic congestive heart failure (HCC) 03/08/2015  . OSA (obstructive sleep apnea) 03/08/2015  . 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 Edema 03/05/2015    Past Surgical History:  Procedure Laterality Date  . NO PAST SURGERIES          Home Medications    Prior to Admission medications   Medication Sig Start Date End Date Taking? Authorizing Provider  carvedilol (COREG) 12.5 MG tablet TAKE 1 TABLET (12.5 MG TOTAL) BY MOUTH 2 (TWO) TIMES DAILY. 04/13/19 07/12/19  Abelino DerrickKilroy, Luke K, PA-C  cetirizine (ZYRTEC) 10 MG tablet Take 1 tablet (10 mg total) by mouth daily. 08/07/17   Mikell, Asiyah  Gust Brooms, MD  colchicine 0.6 MG tablet Take 1 tablet (0.6 mg total) by mouth daily. 03/02/19   Cheron Schaumann K, PA-C  ENTRESTO 97-103 MG TAKE 1 TABLET BY MOUTH TWICE A DAY 04/13/19   Croitoru, Mihai, MD  fluticasone (FLONASE) 50 MCG/ACT nasal spray Place 1 spray into both nostrils daily. 1 spray in each nostril every day 12/15/18   Eustace Moore, MD  furosemide (LASIX) 80 MG tablet Take 1 tablet (80 mg total) by mouth daily. 04/27/19   Croitoru, Mihai, MD  isosorbide-hydrALAZINE (BIDIL) 20-37.5 MG tablet Take 1  tablet by mouth 3 (three) times daily. 05/13/19   Croitoru, Mihai, MD  spironolactone (ALDACTONE) 50 MG tablet TAKE 1 TABLET BY MOUTH EVERY DAY 04/10/19   Croitoru, Rachelle Hora, MD    Family History Family History  Problem Relation Age of Onset  . Hypertension Mother   . Hyperlipidemia Mother   . Heart failure Mother   . Multiple sclerosis Mother   . Breast cancer Maternal Grandmother   . Diabetes Maternal Grandmother   . Colon cancer Maternal Uncle     Social History Social History   Tobacco Use  . Smoking status: Current Every Day Smoker    Packs/day: 0.50    Years: 19.00    Pack years: 9.50    Types: Cigarettes    Start date: 01/05/1996    Last attempt to quit: 03/04/2015    Years since quitting: 4.2  . Smokeless tobacco: Never Used  . Tobacco comment: Already decreasaed smoking  Substance Use Topics  . Alcohol use: No    Alcohol/week: 1.0 - 2.0 standard drinks    Types: 1 - 2 Standard drinks or equivalent per week    Comment: Occasional  . Drug use: No     Allergies   Penicillins   Review of Systems Review of Systems  Constitutional: Positive for unexpected weight change. Negative for chills, diaphoresis, fatigue and fever.  HENT: Negative for congestion, rhinorrhea, sore throat and trouble swallowing.   Eyes: Negative for visual disturbance.  Respiratory: Positive for cough and shortness of breath. Negative for chest tightness, wheezing and stridor.   Cardiovascular: Positive for leg swelling. Negative for chest pain and palpitations.  Gastrointestinal: Positive for abdominal distention. Negative for abdominal pain, blood in stool, diarrhea, nausea and vomiting.  Endocrine: Negative for cold intolerance and heat intolerance.  Genitourinary: Negative for dysuria, flank pain and frequency.  Musculoskeletal: Negative for myalgias.  Skin: Negative for pallor and rash.  Allergic/Immunologic: Negative for environmental allergies and food allergies.  Neurological: Negative  for dizziness, syncope, speech difficulty, weakness, light-headedness and numbness.  Psychiatric/Behavioral: The patient is not nervous/anxious.      Physical Exam Updated Vital Signs BP (!) 141/86   Pulse (!) 104   Temp 98.7 F (37.1 C) (Oral)   Resp (!) 22   Ht 6\' 1"  (1.854 m)   Wt (!) 156.5 kg   SpO2 (!) 87%   BMI 45.52 kg/m   Physical Exam Vitals signs and nursing note reviewed. Exam conducted with a chaperone present.  Constitutional:      General: He is not in acute distress.    Appearance: He is well-developed. He is obese. He is not diaphoretic.  HENT:     Head: Normocephalic and atraumatic.  Eyes:     Extraocular Movements: Extraocular movements intact.     Conjunctiva/sclera: Conjunctivae normal.     Pupils: Pupils are equal, round, and reactive to light.  Neck:     Musculoskeletal:  Normal range of motion.  Cardiovascular:     Rate and Rhythm: Regular rhythm. Tachycardia present.     Heart sounds: Normal heart sounds. No murmur. No friction rub. No gallop.   Pulmonary:     Effort: Pulmonary effort is normal. No respiratory distress.     Breath sounds: Normal breath sounds. No wheezing or rales.     Comments: Patient is speaking in full sentences without difficulty with 2L of oxygen.  Abdominal:     General: Abdomen is protuberant. There is distension.     Palpations: Abdomen is soft.     Tenderness: There is no abdominal tenderness. There is no guarding or rebound.  Genitourinary:    Penis: Normal. No tenderness.      Scrotum/Testes:        Right: Swelling present. Mass or tenderness not present.        Left: Swelling present. Mass or tenderness not present.  Musculoskeletal: Normal range of motion.     Right lower leg: 2+ Edema present.     Left lower leg: 2+ Edema present.  Skin:    General: Skin is warm.     Findings: No erythema or rash.  Neurological:     Mental Status: He is alert.     ED Treatments / Results  Labs (all labs ordered are  listed, but only abnormal results are displayed) Labs Reviewed  SARS CORONAVIRUS 2 ([place] [facility])  BASIC METABOLIC PANEL  MAGNESIUM  BRAIN NATRIURETIC PEPTIDE  CBC  TROPONIN I    EKG None  Radiology No results found.  Procedures Procedures (including critical care time)  Medications Ordered in ED Medications - No data to display   Initial Impression / Assessment and Plan / ED Course  I have reviewed the triage vital signs and the nursing notes.  Pertinent labs & imaging results that were available during my care of the patient were reviewed by me and considered in my medical decision making (see chart for details).  Clinical Course as of May 28 1957  Fri May 29, 2019  1820 WBCs are within normal limits.   WBC: 6.8 [AH]  1832 No acute cardiopulmonary disease. Stable cardiomegaly.    DG Chest Portable 1 View [AH]    Clinical Course User Index [AH] Arville Lime, [place]      Patient presents with shortness of breath and leg edema. Patient has a history of CHF. BNP elevated at 499.5. Troponin negative. CXR does not reveal acute cardiopulmonary disease. EKG without acute changes. Patient has significant weight gain since last recorded weight in 01/2019 (156kg in February to 185kg today). Patient is requiring 2L of oxygen while in the ER and this is a new oxygen requirement. Provided IV lasix in the ER. Patient is stable in no acute distress at this time. Will consult hospitalist for admission. Hospitalist has agreed to admit patient.  Findings and plan of care discussed with supervising physician Dr. Eulis Foster who personally evaluated and examined this patient.   Final Clinical Impressions(s) / ED Diagnoses   Final diagnoses:  SOB (shortness of breath)  Leg edema  Acute on chronic combined systolic and diastolic congestive heart failure [facility])    ED Discharge Orders    None       Arville Lime, PA-C 05/29/19 2021     Arville Lime, [place] 05/29/19 2022    Daleen Bo, MD 05/29/19 2221

## 2019-05-29 NOTE — Progress Notes (Signed)
Virtual Visit via Video Note   This visit type was conducted due to national recommendations for restrictions regarding the COVID-19 Pandemic (e.g. social distancing) in an effort to limit this patient's exposure and mitigate transmission in our community.  Due to his co-morbid illnesses, this patient is at least at moderate risk for complications without adequate follow up.  This format is felt to be most appropriate for this patient at this time.  All issues noted in this document were discussed and addressed.  A limited physical exam was performed with this format.  Please refer to the patient's chart for his consent to telehealth for [facility].    Date:  05/29/2019   ID:  Roger Ward, DOB Oct 03, 1981, MRN [id number]  Patient Location: Home Provider Location: Home  PCP:  Golden Circle, FNP  Cardiologist:  Sanda Klein, MD  Electrophysiologist:  None   Evaluation Performed:  Follow-Up Visit for CHF  Chief Complaint: Edema and dyspnea  History of Present Illness:    Roger Ward is a 38 y.o. male with super morbid obesity, malignant hypertension, obstructive sleep apnea, biventricular failure, mild to moderate LV dysfunction (EF 40%), moderate pulmonary artery hypertension (PASP 45 mmHg) due to cor pulmonale.   He reports he has been compliant with a sodium restricted diet and has been taking furosemide 80 mg twice daily with good compliance, but despite this he has had steady increase in weight and worsening edema.  He now has edema in his legs all the way to the genital area.  He denies orthopnea but has dyspnea with minimal activity around the house.  He has not had PND.  He seems to be at least 15 pounds above target "dry weight".  He reports that he needs new CPAP equipment and has tried to get her from advanced home care, but it appears he is no longer in the system, probably due to noncompliance with follow-up.  He does not have a home blood pressure cuff.  He  reports compliance with carvedilol, BiDil and Entresto.  He denies problems with angina, palpitations, syncope, lightheadedness, focal neurological events or excessive daytime hypersomnolence.  The patient does not have symptoms concerning for COVID-19 infection (fever, chills, cough, or new shortness of breath).  No known sick contacts.   Past Medical History:  Diagnosis Date  . 2nd degree AV block    a. noted during 02/2015 admission - second degree AV AV block (not further defined in notes). Not on BB due to this.  . Asthma    Childhood  . Bradycardia    a. noted during 02/2015 admission - second degree AV AV block (not further defined in notes). Not on BB due to this.  . Chronic combined systolic and diastolic CHF (congestive heart failure) ([place])    a. echo 03/06/15 showing mild LVH, EF 40%, high ventricular filling pressures, mod MR, mildly dilated RV/mildly reduced RV function, mildly dilated RA, PASP 87mmHg.  . Diabetes mellitus, type 2 ([place])   . Essential hypertension   . Family history of adverse reaction to anesthesia    mother and aunt  both put to sleep for surgery and "stopped breathing"  . Morbid obesity ([place])   . NSVT (nonsustained ventricular tachycardia) ([place])    a. noted during 02/2015 admission.  . OSA (obstructive sleep apnea)    a. noncompliance with CPAP - wakes up with it off.   Past Surgical History:  Procedure Laterality Date  . NO PAST SURGERIES  Current Meds  Medication Sig  . carvedilol (COREG) 12.5 MG tablet TAKE 1 TABLET (12.5 MG TOTAL) BY MOUTH 2 (TWO) TIMES DAILY.  . cetirizine (ZYRTEC) 10 MG tablet Take 1 tablet (10 mg total) by mouth daily.  . colchicine 0.6 MG tablet Take 1 tablet (0.6 mg total) by mouth daily.  Marland Kitchen. ENTRESTO 97-103 MG TAKE 1 TABLET BY MOUTH TWICE A DAY  . fluticasone (FLONASE) 50 MCG/ACT nasal spray Place 1 spray into both nostrils daily. 1 spray in each nostril every day  . furosemide (LASIX) 80 MG tablet Take 1 tablet (80 mg  total) by mouth daily.  . isosorbide-hydrALAZINE (BIDIL) 20-37.5 MG tablet Take 1 tablet by mouth 3 (three) times daily.  Marland Kitchen. spironolactone (ALDACTONE) 50 MG tablet TAKE 1 TABLET BY MOUTH EVERY DAY     Allergies:   Penicillins   Social History   Tobacco Use  . Smoking status: Current Every Day Smoker    Packs/day: 0.50    Years: 19.00    Pack years: 9.50    Types: Cigarettes    Start date: 01/05/1996    Last attempt to quit: 03/04/2015    Years since quitting: 4.2  . Smokeless tobacco: Never Used  . Tobacco comment: Already decreasaed smoking  Substance Use Topics  . Alcohol use: No    Alcohol/week: 1.0 - 2.0 standard drinks    Types: 1 - 2 Standard drinks or equivalent per week    Comment: Occasional  . Drug use: No     Family Hx: The patient's family history includes Breast cancer in his maternal grandmother; Colon cancer in his maternal uncle; Diabetes in his maternal grandmother; Heart failure in his mother; Hyperlipidemia in his mother; Hypertension in his mother; Multiple sclerosis in his mother.  ROS:   Please see the history of present illness.     All other systems are reviewed and are negative   Prior CV studies:   The following studies were reviewed today:  Echo 10/22/2017 - Left ventricle: The cavity size was mildly dilated. Wall   thickness was normal. Systolic function was moderately reduced.   The estimated ejection fraction was in the range of 35% to 40%.   Diffuse hypokinesis. Doppler parameters are consistent with   abnormal left ventricular relaxation (grade 1 diastolic   dysfunction). - Ventricular septum: D-shaped interventricular septum suggestive   of RV pressure/volume overload. - Aortic valve: There was no stenosis. - Mitral valve: There was no significant regurgitation. - Right ventricle: Poorly visualized. The cavity size was   moderately dilated. Systolic function was moderately reduced. - Right atrium: Poorly visualized. - Tricuspid  valve: Peak RV-RA gradient (S): 25 mm Hg. - Pulmonary arteries: PA peak pressure: 40 mm Hg (S). - Systemic veins: IVC measured 3 cm with < 50% respirophasic   variation, suggesting RA pressure 15 mmHg.  Impressions:  - Technically difficult study with poor acoustic windows. Mildly   dilated LV with EF 35-40%, diffuse hypokinesis (LV was difficult   to evaluate even with Definity echo contrast). The RV was poorly   visualized. Probably moderately dilated with moderately decreased   systolic function. D-shaped interventricular septum suggestive of   RV pressure/volume overload. Mild pulmonary hypertension. Dilated   IVC suggestive of elevated RV filling pressure.  Labs/Other Tests and Data Reviewed:    EKG:  An ECG dated 01/22/2019 was personally reviewed today and demonstrated:  Sinus rhythm with right bundle branch block  Recent Labs: 06/03/2018: BUN 7; Creatinine, Ser 1.01; Potassium  4.3; Sodium 141   Recent Lipid Panel Lab Results  Component Value Date/Time   CHOL 166 03/06/2015 01:55 AM   TRIG 248 (H) 03/06/2015 01:55 AM   HDL 42 03/06/2015 01:55 AM   CHOLHDL 4.0 03/06/2015 01:55 AM   LDLCALC 74 03/06/2015 01:55 AM    Wt Readings from Last 3 Encounters:  05/29/19 (!) 365 lb (165.6 kg)  01/22/19 (!) 358 lb (162.4 kg)  06/03/18 (!) 350 lb (158.8 kg)     Objective:    Vital Signs:  Ht 6\' 1"  (1.854 m)   Wt (!) 365 lb (165.6 kg)   BMI 48.16 kg/m    VITAL SIGNS:  reviewed GEN:  no acute distress EYES:  sclerae anicteric, EOMI - Extraocular Movements Intact RESPIRATORY:  Appears to be mildly tachypneic at rest CARDIOVASCULAR:  anasarca noted SKIN:  no rash, lesions or ulcers. MUSCULOSKELETAL:  no obvious deformities. NEURO:  alert and oriented x 3, no obvious focal deficit PSYCH:  normal affect Morbid obesity limits the physical examination, especially in the setting of a video visit.  ASSESSMENT & PLAN:    1. CHF:  acute on chronic biventricular failure, now with  suggestion of anasarca despite high dose of loop diuretic.  He is at least 15 pounds above his estimated "dry weight" of 350 pounds.  In the past he has presented with as much as 50 pound fluid overload.  He has dyspnea with minimal activity.  I think he will need intravenous diuretics and he will go to the emergency room today. 2. Malignant HTN: Unable to check today. I encouraged him to purchase a blood pressure cuff. 3. Morbid obesity: It is highly likely that he will have further complications and a shortened life span if he does not lose substantial weight. 4. OSA: Most of his current manifestations are of right heart failure.  I am sure that he has cor pulmonale.  He should use CPAP 100% of the time and we will try to get him the equipment that he needs.  He may need a repeat in home sleep study if he has been noncompliant for a while. 5. 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 6. NSVT: Asymptomatic, detected on previous telemetry.  No syncope or near syncope.  COVID-19 Education: The signs and symptoms of COVID-19 were discussed with the patient and how to seek care for testing (follow up with PCP or arrange E-visit).  The importance of social distancing was discussed today.  Time:   Today, I have spent 25 minutes with the patient with telehealth technology discussing the above problems.     Medication Adjustments/Labs and Tests Ordered: Current medicines are reviewed at length with the patient today.  Concerns regarding medicines are outlined above.   Tests Ordered: No orders of the defined types were placed in this encounter.   Medication Changes: No orders of the defined types were placed in this encounter.   Disposition: Emergency room evaluation for intravenous diuretics.  Schedule follow-up in 2-4 weeks.  Signed, Thurmon Fair, MD  05/29/2019 9:54 AM    [facility]

## 2019-05-29 NOTE — Progress Notes (Signed)
RT placed pt on CPAP dream station for the night. Pt is on auto titrate 20 max 12 min with 3 Lpm bled into the system. Pt respiratory status stable at this time and tolerating settings well. RT will continue to monitor.

## 2019-05-29 NOTE — ED Provider Notes (Signed)
  Face-to-face evaluation   History: He presents for evaluation of shortness of breath with suspected weight gain however not confirmed by weighing at home.  He talked to his cardiologist by telemedicine interview today who suggested they come the ED for assessment, and treatment.  Physical exam: VS, alert and cooperative.  No dysarthria.  No respiratory distress.  Lungs with diminished air movement bilaterally.  There is no increased work of breathing.  Legs with 3-4+ edema, bilaterally.  They are nontender to palpation.  Medical screening examination/treatment/procedure(s) were conducted as a shared visit with non-physician practitioner(s) and myself.  I personally evaluated the patient during the encounter    Daleen Bo, MD 05/29/19 2221

## 2019-05-29 NOTE — ED Triage Notes (Signed)
Pt here for eval of shortness of breath with swelling and weight gain for several days. Pt abdomen and genitals are also swollen. Sleeps with CPAP at night. Pt 87% on room air. Denies fevers or chills.

## 2019-05-30 ENCOUNTER — Observation Stay (HOSPITAL_BASED_OUTPATIENT_CLINIC_OR_DEPARTMENT_OTHER): Payer: Managed Care, Other (non HMO)

## 2019-05-30 ENCOUNTER — Observation Stay (HOSPITAL_COMMUNITY): Payer: Managed Care, Other (non HMO)

## 2019-05-30 DIAGNOSIS — I441 Atrioventricular block, second degree: Secondary | ICD-10-CM | POA: Diagnosis present

## 2019-05-30 DIAGNOSIS — Z6841 Body Mass Index (BMI) 40.0 and over, adult: Secondary | ICD-10-CM | POA: Diagnosis not present

## 2019-05-30 DIAGNOSIS — I2721 Secondary pulmonary arterial hypertension: Secondary | ICD-10-CM | POA: Diagnosis present

## 2019-05-30 DIAGNOSIS — J9601 Acute respiratory failure with hypoxia: Secondary | ICD-10-CM | POA: Diagnosis present

## 2019-05-30 DIAGNOSIS — I5023 Acute on chronic systolic (congestive) heart failure: Secondary | ICD-10-CM

## 2019-05-30 DIAGNOSIS — I5031 Acute diastolic (congestive) heart failure: Secondary | ICD-10-CM | POA: Diagnosis not present

## 2019-05-30 DIAGNOSIS — Z8249 Family history of ischemic heart disease and other diseases of the circulatory system: Secondary | ICD-10-CM | POA: Diagnosis not present

## 2019-05-30 DIAGNOSIS — E1122 Type 2 diabetes mellitus with diabetic chronic kidney disease: Secondary | ICD-10-CM | POA: Diagnosis present

## 2019-05-30 DIAGNOSIS — I5043 Acute on chronic combined systolic (congestive) and diastolic (congestive) heart failure: Secondary | ICD-10-CM

## 2019-05-30 DIAGNOSIS — J45909 Unspecified asthma, uncomplicated: Secondary | ICD-10-CM | POA: Diagnosis present

## 2019-05-30 DIAGNOSIS — R0602 Shortness of breath: Secondary | ICD-10-CM | POA: Diagnosis present

## 2019-05-30 DIAGNOSIS — Z720 Tobacco use: Secondary | ICD-10-CM | POA: Diagnosis not present

## 2019-05-30 DIAGNOSIS — N183 Chronic kidney disease, stage 3 (moderate): Secondary | ICD-10-CM | POA: Diagnosis present

## 2019-05-30 DIAGNOSIS — Z833 Family history of diabetes mellitus: Secondary | ICD-10-CM | POA: Diagnosis not present

## 2019-05-30 DIAGNOSIS — Z8349 Family history of other endocrine, nutritional and metabolic diseases: Secondary | ICD-10-CM | POA: Diagnosis not present

## 2019-05-30 DIAGNOSIS — Z20828 Contact with and (suspected) exposure to other viral communicable diseases: Secondary | ICD-10-CM | POA: Diagnosis present

## 2019-05-30 DIAGNOSIS — I5082 Biventricular heart failure: Secondary | ICD-10-CM | POA: Diagnosis present

## 2019-05-30 DIAGNOSIS — G4733 Obstructive sleep apnea (adult) (pediatric): Secondary | ICD-10-CM | POA: Diagnosis present

## 2019-05-30 DIAGNOSIS — Z9119 Patient's noncompliance with other medical treatment and regimen: Secondary | ICD-10-CM | POA: Diagnosis not present

## 2019-05-30 DIAGNOSIS — Z7951 Long term (current) use of inhaled steroids: Secondary | ICD-10-CM | POA: Diagnosis not present

## 2019-05-30 DIAGNOSIS — F17210 Nicotine dependence, cigarettes, uncomplicated: Secondary | ICD-10-CM | POA: Diagnosis present

## 2019-05-30 DIAGNOSIS — I428 Other cardiomyopathies: Secondary | ICD-10-CM | POA: Diagnosis present

## 2019-05-30 DIAGNOSIS — M109 Gout, unspecified: Secondary | ICD-10-CM | POA: Diagnosis present

## 2019-05-30 DIAGNOSIS — E119 Type 2 diabetes mellitus without complications: Secondary | ICD-10-CM | POA: Diagnosis not present

## 2019-05-30 DIAGNOSIS — I2781 Cor pulmonale (chronic): Secondary | ICD-10-CM | POA: Diagnosis present

## 2019-05-30 DIAGNOSIS — Z880 Allergy status to penicillin: Secondary | ICD-10-CM | POA: Diagnosis not present

## 2019-05-30 DIAGNOSIS — Z79899 Other long term (current) drug therapy: Secondary | ICD-10-CM | POA: Diagnosis not present

## 2019-05-30 DIAGNOSIS — I5041 Acute combined systolic (congestive) and diastolic (congestive) heart failure: Secondary | ICD-10-CM | POA: Diagnosis not present

## 2019-05-30 DIAGNOSIS — I130 Hypertensive heart and chronic kidney disease with heart failure and stage 1 through stage 4 chronic kidney disease, or unspecified chronic kidney disease: Secondary | ICD-10-CM | POA: Diagnosis present

## 2019-05-30 LAB — ECHOCARDIOGRAM COMPLETE
Height: 73 in
Weight: 6323.2 oz

## 2019-05-30 LAB — BASIC METABOLIC PANEL
Anion gap: 10 (ref 5–15)
BUN: 18 mg/dL (ref 6–20)
CO2: 36 mmol/L — ABNORMAL HIGH (ref 22–32)
Calcium: 8.7 mg/dL — ABNORMAL LOW (ref 8.9–10.3)
Chloride: 97 mmol/L — ABNORMAL LOW (ref 98–111)
Creatinine, Ser: 1.25 mg/dL — ABNORMAL HIGH (ref 0.61–1.24)
GFR calc Af Amer: >60 mL/min (ref >60)
GFR calc non Af Amer: >60 mL/min (ref >60)
Glucose, Bld: 108 mg/dL — ABNORMAL HIGH (ref 70–99)
Potassium: 4.4 mmol/L (ref 3.5–5.1)
Sodium: 143 mmol/L (ref 135–145)

## 2019-05-30 LAB — HIV ANTIBODY (ROUTINE TESTING W REFLEX): HIV Screen 4th Generation wRfx: NONREACTIVE

## 2019-05-30 MED ORDER — TECHNETIUM TO 99M ALBUMIN AGGREGATED
1.5000 | Freq: Once | INTRAVENOUS | Status: AC | PRN
  Administered 2019-05-30: 1.5 via INTRAVENOUS

## 2019-05-30 MED ORDER — METOLAZONE 2.5 MG PO TABS
2.5000 mg | ORAL_TABLET | Freq: Once | ORAL | Status: AC
  Administered 2019-05-30: 2.5 mg via ORAL
  Filled 2019-05-30: qty 1

## 2019-05-30 NOTE — Progress Notes (Signed)
RT placed pt on CPAP dream station for the night. Pt on auto titrate 20 max 12 min with 3 Lpm bled into the system. Pt respiratory status stable at this time. RT will continue to monitor.

## 2019-05-30 NOTE — Progress Notes (Signed)
Patient ID: Roger Ward, male   DOB: April 04, 1981, 38 y.o.   MRN: [id number]    Advanced Heart Failure Team Consult Note   Primary Physician: Veryl Speak, FNP PCP-Cardiologist:  Thurmon Fair, MD  Reason for Consultation: CHF   HPI:    Roger Ward is seen today for evaluation of CHF at the request of Dr Clyde Lundborg.   Roger Ward is a 38 y.o. male with super morbid obesity, malignant hypertension, obstructive sleep apnea, biventricular failure, moderate pulmonary artery hypertension (PASP 45 mmHg) due to cor pulmonale.   LV EF has been known to be low since 3/16, echo showed E 40% at that point.  Last echo in 11/18 showed EF 35-40%, moderately decreased RV systolic function, and D-shaped interventricular septum suggestive of RV pressure/volume overload.  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   He reports increased dyspnea and lower extremity edema x 1 week. He is now short of breath walking around his house and orthopneic.  No chest pain.  No lightheadedness.  BP stable.  He says that he has been taking all his cardiac meds, he has been on a good regimen at home.  He says that he uses CPAP every night.   Review of Systems: All systems reviewed and negative except as per HPI.   Home Medications Prior to Admission medications   Medication Sig Start Date End Date Taking? Authorizing Provider  carvedilol (COREG) 12.5 MG tablet TAKE 1 TABLET (12.5 MG TOTAL) BY MOUTH 2 (TWO) TIMES DAILY. 04/13/19 07/12/19 Yes Kilroy, Luke K, PA-C  ENTRESTO 97-103 MG TAKE 1 TABLET BY MOUTH TWICE A DAY Patient taking differently: Take 1 tablet by mouth 2 (two) times daily.  04/13/19  Yes Croitoru, Mihai, MD  furosemide (LASIX) 80 MG tablet Take 1 tablet (80 mg total) by mouth daily. 04/27/19  Yes Croitoru, Mihai, MD  isosorbide-hydrALAZINE (BIDIL) 20-37.5 MG tablet Take 1 tablet by mouth 3 (three) times daily. 05/13/19  Yes Croitoru, Mihai, MD  spironolactone (ALDACTONE)  50 MG tablet TAKE 1 TABLET BY MOUTH EVERY DAY Patient taking differently: Take 50 mg by mouth daily.  04/10/19  Yes Croitoru, Mihai, MD  cetirizine (ZYRTEC) 10 MG tablet Take 1 tablet (10 mg total) by mouth daily. Patient not taking: Reported on 05/29/2019 08/07/17   Berton Bon, MD  colchicine 0.6 MG tablet Take 1 tablet (0.6 mg total) by mouth daily. Patient not taking: Reported on 05/29/2019 03/02/19   Elson Areas, PA-C  fluticasone [facility]) 50 MCG/ACT nasal spray Place 1 spray into both nostrils daily. 1 spray in each nostril every day Patient not taking: Reported on 05/29/2019 12/15/18   Eustace Moore, MD    Past Medical History: Past Medical History:  Diagnosis Date  . 2nd degree AV block    a. noted during 02/2015 admission - second degree AV AV block (not further defined in notes). Not on BB due to this.  . Asthma    Childhood  . Bradycardia    a. noted during 02/2015 admission - second degree AV AV block (not further defined in notes). Not on BB due to this.  . Chronic combined systolic and diastolic CHF (congestive heart failure) ([facility])    a. echo 03/06/15 showing mild LVH, EF 40%, high ventricular filling pressures, mod MR, mildly dilated RV/mildly reduced RV function, mildly dilated RA, PASP .  . Diabetes mellitus, type 2 ([facility])   .  Essential hypertension   . Family history of adverse reaction to anesthesia    mother and aunt  both put to sleep for surgery and "stopped breathing"  . Morbid obesity ([place])   . NSVT (nonsustained ventricular tachycardia) ([place])    a. noted during 02/2015 admission.  . OSA (obstructive sleep apnea)    a. noncompliance with CPAP - wakes up with it off.    Past Surgical History: Past Surgical History:  Procedure Laterality Date  . NO PAST SURGERIES      Family History: Family History  Problem Relation Age of Onset  . Hypertension Mother   . Hyperlipidemia Mother   . Heart failure Mother   . Multiple sclerosis Mother   .  Breast cancer Maternal Grandmother   . Diabetes Maternal Grandmother   . Colon cancer Maternal Uncle     Social History: Social History   Socioeconomic History  . Marital status: Married    Spouse name: Not on file  . Number of children: 0  . Years of education: 16  . Highest education level: Not on file  Occupational History  . Occupation: Librarian, academic at [employer]  . Financial resource strain: Not on file  . Food insecurity    Worry: Not on file    Inability: Not on file  . Transportation needs    Medical: Not on file    Non-medical: Not on file  Tobacco Use  . Smoking status: Current Every Day Smoker    Packs/day: 0.50    Years: 19.00    Pack years: 9.50    Types: Cigarettes    Start date: 01/05/1996    Last attempt to quit: 03/04/2015    Years since quitting: 4.2  . Smokeless tobacco: Never Used  . Tobacco comment: Already decreasaed smoking  Substance and Sexual Activity  . Alcohol use: No    Alcohol/week: 1.0 - 2.0 standard drinks    Types: 1 - 2 Standard drinks or equivalent per week    Comment: Occasional  . Drug use: No  . Sexual activity: Yes    Birth control/protection: Condom  Lifestyle  . Physical activity    Days per week: Not on file    Minutes per session: Not on file  . Stress: Not on file  Relationships  . Social Herbalist on phone: Not on file    Gets together: Not on file    Attends religious service: Not on file    Active member of club or organization: Not on file    Attends meetings of clubs or organizations: Not on file    Relationship status: Not on file  Other Topics Concern  . Not on file  Social History Narrative   Born and raised in [place]. Fun: Bowling, movies, we go   Denies religious beliefs that would effect health care.     Allergies:  Allergies  Allergen Reactions  . Penicillins Swelling    Objective:    Vital Signs:   Temp:  [97 F (36.1 C)-99 F (37.2 C)] 99 F (37.2 C)  (06/13 0804) Pulse Rate:  [78-104] 84 (06/13 0804) Resp:  [13-24] 20 (06/13 0804) BP: (101-141)/(60-86) 119/74 (06/13 0804) SpO2:  [87 %-100 %] 94 % (06/13 0804) Weight:  [156.5 kg-185.3 kg] 179.3 kg (06/13 0500) Last BM Date: 05/29/19  Weight change: Filed Weights   05/29/19 1835 05/29/19 2027 05/30/19 0500  Weight: (!) 185.3 kg (!) 177.4 kg (!) 179.3 kg  Intake/Output:   Intake/Output Summary (Last 24 hours) at 05/30/2019 1142 Last data filed at 05/30/2019 0500 Gross per 24 hour  Intake 480 ml  Output 3100 ml  Net -2620 ml      Physical Exam    General:  Obese, NAD HEENT: normal Neck: supple. JVP 14+. Carotids 2+ bilat; no bruits. No lymphadenopathy or thyromegaly appreciated. Cor: PMI nonpalpable. Regular rate & rhythm. No rubs, gallops or murmurs. Lungs: clear Abdomen: soft, nontender, nondistended. No hepatosplenomegaly. No bruits or masses. Good bowel sounds. Extremities: no cyanosis, clubbing, rash. 2+ chronic edema to knees.  Neuro: alert & orientedx3, cranial nerves grossly intact. moves all 4 extremities w/o difficulty. Affect pleasant   Telemetry   NSR 80s, personally reviewed  EKG    NSR, LAFB, RBBB (personally reviewed)  Labs   Basic Metabolic Panel: Recent Labs  Lab 05/29/19 1710 05/30/19 0520  NA 142 143  K 4.3 4.4  CL 99 97*  CO2 31 36*  GLUCOSE 135* 108*  BUN 21* 18  CREATININE 1.23 1.25*  CALCIUM 8.5* 8.7*  MG 2.0  --     Liver Function Tests: No results for input(s): AST, ALT, ALKPHOS, BILITOT, PROT, ALBUMIN in the last 168 hours. No results for input(s): LIPASE, AMYLASE in the last 168 hours. No results for input(s): AMMONIA in the last 168 hours.  CBC: Recent Labs  Lab 05/29/19 1710  WBC 6.8  HGB 15.7  HCT 52.0  MCV 86.4  PLT 290    Cardiac Enzymes: Recent Labs  Lab 05/29/19 1710  TROPONINI <0.03    BNP: BNP (last 3 results) Recent Labs    05/29/19 1710  BNP 499.5*    ProBNP (last 3 results) No results  for input(s): PROBNP in the last 8760 hours.   CBG: No results for input(s): GLUCAP in the last 168 hours.  Coagulation Studies: No results for input(s): LABPROT, INR in the last 72 hours.   Imaging   Dg Chest Portable 1 View  Result Date: 05/29/2019 CLINICAL DATA:  Swelling. Shortness of breath today. History of CHF and hypertension. EXAM: PORTABLE CHEST 1 VIEW COMPARISON:  10/21/2017 FINDINGS: Mild enlargement of the cardiopericardial silhouette, stable. No mediastinal or hilar masses. No convincing adenopathy. Lungs are clear.  No pleural effusion or pneumothorax. Skeletal structures are grossly intact. IMPRESSION: 1. No acute cardiopulmonary disease. 2. Stable cardiomegaly. Electronically Signed   By: Amie Portlandavid  Ormond M.D.   On: 05/29/2019 18:00      Medications:     Current Medications: . aspirin EC  81 mg Oral Daily  . carvedilol  12.5 mg Oral BID  . colchicine  0.6 mg Oral Daily  . enoxaparin (LOVENOX) injection  40 mg Subcutaneous Q24H  . furosemide  80 mg Intravenous BID  . isosorbide-hydrALAZINE  1 tablet Oral TID  . metolazone  2.5 mg Oral Once  . nicotine  21 mg Transdermal Daily  . sacubitril-valsartan  1 tablet Oral BID  . sodium chloride flush  3 mL Intravenous Q12H  . spironolactone  50 mg Oral Daily     Infusions: . sodium chloride         Assessment/Plan   1. Acute on chronic systolic CHF with prominent RV dysfunction: Echo in 2018 showed EF 35-40%, diffuse hypokinesis, D-shaped interventricular septum, moderately dysfunctional RV.  Suspect cor pulmonale.  Cause of RV failure uncertain, he does have significant OSA.  However, no evidence so far for OHS (oxygen saturation ok on room air during day so far). He  is markedly volume overloaded on exam. Creatinine stable.  I/Os negative with Lasix overnight. He is on a good regimen of cardiac meds which I will continue.  - Will continue Lasix 80 mg IV bid and give a dose of metolazone 2.5 today.  - Continue  Entresto 97/103 bid, spironolactone 50 daily, Bidil 1 tab tid, Coreg 12.5 mg bid.  - Repeat echo.  - With RV failure and concern for pulmonary hypertension, will get V/Q scan to rule out chronic PE.  2. OSA/?OHS: Continue nightly CPAP.  3. 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  Currently in NSR.  4. HTN: BP controlled. 5. Morbid obesity  Length of Stay: 0  Marca Anconaalton Demaryius Imran, MD  05/30/2019, 11:42 AM  Advanced Heart Failure Team Pager [phone] (M-F; 7a - 4p)  Please contact [facility] for night-coverage after hours (4p -7a ) and weekends on [email or web address]

## 2019-05-30 NOTE — Plan of Care (Signed)
  Problem: Education: Goal: Knowledge of General Education information will improve Description: Including pain rating scale, medication(s)/side effects and non-pharmacologic comfort measures Outcome: Progressing   Problem: Health Behavior/Discharge Planning: Goal: Ability to manage health-related needs will improve Outcome: Progressing   Problem: Clinical Measurements: Goal: Respiratory complications will improve Outcome: Progressing Goal: Cardiovascular complication will be avoided Outcome: Progressing   Problem: Coping: Goal: Level of anxiety will decrease Outcome: Progressing   Problem: Elimination: Goal: Will not experience complications related to urinary retention Outcome: Progressing

## 2019-05-30 NOTE — Progress Notes (Signed)
PROGRESS NOTE  Lubertha BasqueJerome L Mcbreen ZOX:[id number] DOB: 03/11/1981 DOA: 05/29/2019 PCP: Veryl Speakalone, Gregory D, FNP  HPI/Recap of past 24 hours:  Reports feeling better, but remain significantly volume overloaded, remain dyspnea on minimal exertion  He denies chest pain, no hypoxia at rest 3liter urine output since admission  He reports he does not have a scale at home,  Assessment/Plan: Principal Problem:   Acute combined systolic and diastolic congestive heart failure (HCC) Active Problems:   OSA (obstructive sleep apnea)   Tobacco abuse   Acute hypoxemic respiratory failure (HCC)   Chronic renal insufficiency, stage III (moderate) (HCC)   Type II diabetes mellitus with renal manifestations (HCC)   Acute on chronic combined systolic (congestive) and diastolic (congestive) heart failure (HCC)  Acute on chronic systolic CHF with acute hypoxic respiratory failure -o2 sats 87% on room air on presentation, he reports progressive dyspnea with walking from one room to another room ( at baseline he can walk down the driveway to get his mail), he reports increased edema -VQ scan low probability for PE -repeat echo cardiogram pending -continue iv lasix bid, coreg, bidil, entresto , spironolactone -cardiology /heart failure team consulted, input appreciated, will follow recommendation   CKDII -cr 1.23, likely close to baseline -monitor cr while on lasix  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  Blood sugar 135 today  H/o gout: stable , on colchicine daily at home which is continued    Morbid obesity/OSa on cpap Body mass index is 52.14 kg/m.   Tobacco abuse: -Did counseling about importance of quitting smoking -Nicotine patch   Code Status: full  Family Communication: patient   Disposition Plan: home in 1-2 days with cardiology clearance    Consultants:  Cardiology   Procedures:  VQ scan   Antibiotics:  none   Objective: BP 127/80 (BP Location: Right Arm)   Pulse 74   Temp 98.7 F (37.1 C) (Oral)   Resp 18   Ht 6\' 1"  (1.854 m)   Wt (!) 179.3 kg Comment: scale c  SpO2 93%   BMI 52.14 kg/m   Intake/Output Summary (Last 24 hours) at 05/30/2019 1643 Last data filed at 05/30/2019 0500 Gross per 24 hour  Intake 480 ml  Output 3100 ml  Net -2620 ml   Filed Weights   05/29/19 1835 05/29/19 2027 05/30/19 0500  Weight: (!) 185.3 kg (!) 177.4 kg (!) 179.3 kg    Exam: Patient is examined daily including today on 05/30/2019, exams remain the same as of yesterday except that has changed    General:  NAD, obese   Cardiovascular: RRR  Respiratory: CTABL  Abdomen: Soft/ND/NT, positive BS  Musculoskeletal: bilateral lower extremity pitting  Edema  Neuro: alert, oriented   Data Reviewed: Basic Metabolic Panel: Recent Labs  Lab 05/29/19 1710 05/30/19 0520  NA 142 143  K 4.3 4.4  CL 99 97*  CO2 31 36*  GLUCOSE 135* 108*  BUN 21* 18  CREATININE 1.23 1.25*  CALCIUM 8.5* 8.7*  MG 2.0  --    Liver Function Tests: No results for input(s): AST, ALT, ALKPHOS, BILITOT, PROT, ALBUMIN in the last 168 hours. No results for input(s): LIPASE, AMYLASE in the last 168 hours. No results for input(s): AMMONIA in the last 168 hours. CBC: Recent Labs  Lab 05/29/19 1710  WBC 6.8  HGB 15.7  HCT 52.0  MCV 86.4  PLT 290   Cardiac Enzymes:   Recent Labs  Lab 05/29/19 1710  TROPONINI <0.03   BNP (last 3 results) Recent Labs    05/29/19 1710  BNP 499.5*    ProBNP (last 3 results) No results for input(s): PROBNP in the last 8760 hours.  CBG: No results for input(s): GLUCAP in the last 168 hours.  Recent Results (from the past 240 hour(s))  SARS Coronavirus 2 (CEPHEID- Performed in [facility] hospital lab), Hosp Order     Status: None   Collection Time: 05/29/19  5:23 PM   Specimen: Nasopharyngeal Swab  Result Value Ref Range Status   SARS Coronavirus 2  NEGATIVE NEGATIVE Final    Comment: (NOTE) If result is NEGATIVE SARS-CoV-2 target nucleic acids are NOT DETECTED. The SARS-CoV-2 RNA is generally detectable in upper and lower  respiratory specimens during the acute phase of infection. The lowest  concentration of SARS-CoV-2 viral copies this assay can detect is 250  copies / mL. A negative result does not preclude SARS-CoV-2 infection  and should not be used as the sole basis for treatment or other  patient management decisions.  A negative result may occur with  improper specimen collection / handling, submission of specimen other  than nasopharyngeal swab, presence of viral mutation(s) within the  areas targeted by this assay, and inadequate number of viral copies  (<250 copies / mL). A negative result must be combined with clinical  observations, patient history, and epidemiological information. If result is POSITIVE SARS-CoV-2 target nucleic acids are DETECTED. The SARS-CoV-2 RNA is generally detectable in upper and lower  respiratory specimens dur ing the acute phase of infection.  Positive  results are indicative of active infection with SARS-CoV-2.  Clinical  correlation with patient history and other diagnostic information is  necessary to determine patient infection status.  Positive results do  not rule out bacterial infection or co-infection with other viruses. If result is PRESUMPTIVE POSTIVE SARS-CoV-2 nucleic acids MAY BE PRESENT.   A presumptive positive result was obtained on the submitted specimen  and confirmed on repeat testing.  While 2019 novel coronavirus  (SARS-CoV-2) nucleic acids may be present in the submitted sample  additional confirmatory testing may be necessary for epidemiological  and / or clinical management purposes  to differentiate between  SARS-CoV-2 and other Sarbecovirus currently known to infect humans.  If clinically indicated additional testing with an alternate test  methodology [phone])  is advised. The SARS-CoV-2 RNA is generally  detectable in upper and lower respiratory sp ecimens during the acute  phase of infection. The expected result is Negative. Fact Sheet for Patients:  BoilerBrush.com.cy Fact Sheet for Healthcare Providers: https://pope.com/ This test is not yet approved or cleared by the [place] FDA and has been authorized for detection and/or diagnosis of SARS-CoV-2 by FDA under an Emergency Use Authorization (EUA).  This EUA will remain in effect (meaning this test can be used) for the duration of the COVID-19 declaration under Section 564(b)(1) of the Act, 21 U.S.C. section 360bbb-3(b)(1), unless the authorization is terminated or revoked sooner. Performed at [facility], [address], [place], [place] [address]      Studies: Nm Pulmonary Perfusion  Result Date: 05/30/2019 CLINICAL DATA:  Short of breath. Negative D-dimer. Congestive heart failure EXAM: NUCLEAR MEDICINE PERFUSION LUNG SCAN TECHNIQUE: Perfusion images were obtained in multiple projections after intravenous injection of radiopharmaceutical. RADIOPHARMACEUTICALS:  1.5 mCi Tc-49m MAA COMPARISON:  Chest radiograph 05/29/2011 FINDINGS: No wedge-shaped peripheral perfusion defects to localized acute pulmonary embolism. There is decreased relative perfusion  to the LEFT lung related to the attenuation from the enlarged cardiac silhouette. IMPRESSION: 1. No evidence of acute of pulmonary embolism. 2. Marked cardiomegaly. Electronically Signed   By: Suzy Bouchard M.D.   On: 05/30/2019 13:32   Dg Chest Portable 1 View  Result Date: 05/29/2019 CLINICAL DATA:  Swelling. Shortness of breath today. History of CHF and hypertension. EXAM: PORTABLE CHEST 1 VIEW COMPARISON:  10/21/2017 FINDINGS: Mild enlargement of the cardiopericardial silhouette, stable. No mediastinal or hilar masses. No convincing adenopathy. Lungs are clear.  No pleural  effusion or pneumothorax. Skeletal structures are grossly intact. IMPRESSION: 1. No acute cardiopulmonary disease. 2. Stable cardiomegaly. Electronically Signed   By: Lajean Manes M.D.   On: 05/29/2019 18:00    Scheduled Meds: . aspirin EC  81 mg Oral Daily  . carvedilol  12.5 mg Oral BID  . colchicine  0.6 mg Oral Daily  . enoxaparin (LOVENOX) injection  40 mg Subcutaneous Q24H  . furosemide  80 mg Intravenous BID  . isosorbide-hydrALAZINE  1 tablet Oral TID  . nicotine  21 mg Transdermal Daily  . sacubitril-valsartan  1 tablet Oral BID  . sodium chloride flush  3 mL Intravenous Q12H  . spironolactone  50 mg Oral Daily    Continuous Infusions: . sodium chloride       Time spent: 9mins I have personally reviewed and interpreted on  05/30/2019 daily labs, tele strips, imagings as discussed above under date review session and assessment and plans.  I reviewed all nursing notes, pharmacy notes, consultant notes,  vitals, pertinent old records  I have discussed plan of care as described above with RN , patient on 05/30/2019   Florencia Reasons MD, PhD  [facility] Pager [phone]. If 7PM-7AM, please contact night-coverage at [email or web address], password [facility] 05/30/2019, 4:43 PM  LOS: 0 days

## 2019-05-30 NOTE — Plan of Care (Signed)

## 2019-05-30 NOTE — Plan of Care (Signed)
  Problem: Education: Goal: Knowledge of General Education information will improve Description: Including pain rating scale, medication(s)/side effects and non-pharmacologic comfort measures 05/30/2019 1120 by Youlanda Roys, RN Outcome: Progressing 05/30/2019 1120 by Youlanda Roys, RN Outcome: Progressing   Problem: Health Behavior/Discharge Planning: Goal: Ability to manage health-related needs will improve 05/30/2019 1120 by Youlanda Roys, RN Outcome: Progressing 05/30/2019 1120 by Youlanda Roys, RN Outcome: Progressing   Problem: Clinical Measurements: Goal: Ability to maintain clinical measurements within normal limits will improve 05/30/2019 1120 by Youlanda Roys, RN Outcome: Progressing 05/30/2019 1120 by Youlanda Roys, RN Outcome: Progressing Goal: Will remain free from infection 05/30/2019 1120 by Youlanda Roys, RN Outcome: Progressing 05/30/2019 1120 by Youlanda Roys, RN Outcome: Progressing Goal: Diagnostic test results will improve 05/30/2019 1120 by Youlanda Roys, RN Outcome: Progressing 05/30/2019 1120 by Youlanda Roys, RN Outcome: Progressing Goal: Respiratory complications will improve 05/30/2019 1120 by Youlanda Roys, RN Outcome: Progressing 05/30/2019 1120 by Youlanda Roys, RN Outcome: Progressing Goal: Cardiovascular complication will be avoided 05/30/2019 1120 by Youlanda Roys, RN Outcome: Progressing 05/30/2019 1120 by Youlanda Roys, RN Outcome: Progressing   Problem: Activity: Goal: Risk for activity intolerance will decrease 05/30/2019 1120 by Youlanda Roys, RN Outcome: Progressing 05/30/2019 1120 by Youlanda Roys, RN Outcome: Progressing   Problem: Nutrition: Goal: Adequate nutrition will be maintained 05/30/2019 1120 by Youlanda Roys, RN Outcome: Progressing 05/30/2019 1120 by Youlanda Roys, RN Outcome: Progressing   Problem: Coping: Goal: Level of anxiety will decrease 05/30/2019 1120 by Youlanda Roys, RN Outcome: Progressing 05/30/2019 1120 by Youlanda Roys, RN Outcome: Progressing   Problem: Elimination: Goal: Will not experience complications related to bowel motility 05/30/2019 1120 by Youlanda Roys, RN Outcome: Progressing 05/30/2019 1120 by Youlanda Roys, RN Outcome: Progressing Goal: Will not experience complications related to urinary retention 05/30/2019 1120 by Youlanda Roys, RN Outcome: Progressing 05/30/2019 1120 by Youlanda Roys, RN Outcome: Progressing   Problem: Pain Managment: Goal: General experience of comfort will improve 05/30/2019 1120 by Youlanda Roys, RN Outcome: Progressing 05/30/2019 1120 by Youlanda Roys, RN Outcome: Progressing   Problem: Safety: Goal: Ability to remain free from injury will improve 05/30/2019 1120 by Youlanda Roys, RN Outcome: Progressing 05/30/2019 1120 by Youlanda Roys, RN Outcome: Progressing   Problem: Skin Integrity: Goal: Risk for impaired skin integrity will decrease 05/30/2019 1120 by Youlanda Roys, RN Outcome: Progressing 05/30/2019 1120 by Youlanda Roys, RN Outcome: Progressing   Problem: Education: Goal: Ability to demonstrate management of disease process will improve 05/30/2019 1120 by Youlanda Roys, RN Outcome: Progressing 05/30/2019 1120 by Youlanda Roys, RN Outcome: Progressing Goal: Ability to verbalize understanding of medication therapies will improve 05/30/2019 1120 by Youlanda Roys, RN Outcome: Progressing 05/30/2019 1120 by Youlanda Roys, RN Outcome: Progressing Goal: Individualized Educational Video(s) 05/30/2019 1120 by Youlanda Roys, RN Outcome: Progressing 05/30/2019 1120 by Youlanda Roys, RN Outcome: Progressing   Problem: Activity: Goal: Capacity to carry out activities will improve 05/30/2019 1120 by Youlanda Roys, RN Outcome: Progressing 05/30/2019 1120 by Youlanda Roys, RN Outcome: Progressing   Problem: Cardiac: Goal: Ability to achieve and maintain adequate cardiopulmonary perfusion will improve 05/30/2019 1120 by Youlanda Roys, RN Outcome:  Progressing 05/30/2019 1120 by Youlanda Roys, RN Outcome: Progressing

## 2019-05-31 DIAGNOSIS — I5041 Acute combined systolic (congestive) and diastolic (congestive) heart failure: Secondary | ICD-10-CM

## 2019-05-31 DIAGNOSIS — E119 Type 2 diabetes mellitus without complications: Secondary | ICD-10-CM

## 2019-05-31 LAB — BASIC METABOLIC PANEL
Anion gap: 11 (ref 5–15)
BUN: 16 mg/dL (ref 6–20)
CO2: 38 mmol/L — ABNORMAL HIGH (ref 22–32)
Calcium: 8.9 mg/dL (ref 8.9–10.3)
Chloride: 93 mmol/L — ABNORMAL LOW (ref 98–111)
Creatinine, Ser: 1.25 mg/dL — ABNORMAL HIGH (ref 0.61–1.24)
GFR calc Af Amer: >60 mL/min (ref >60)
GFR calc non Af Amer: >60 mL/min (ref >60)
Glucose, Bld: 100 mg/dL — ABNORMAL HIGH (ref 70–99)
Potassium: 4.3 mmol/L (ref 3.5–5.1)
Sodium: 142 mmol/L (ref 135–145)

## 2019-05-31 MED ORDER — SODIUM CHLORIDE 0.9 % IV SOLN: 250.0000 mL | INTRAVENOUS | Status: DC | PRN

## 2019-05-31 MED ORDER — SODIUM CHLORIDE 0.9% FLUSH
3.0000 mL | Freq: Two times a day (BID) | INTRAVENOUS | Status: DC
  Administered 2019-06-01: 3 mL via INTRAVENOUS

## 2019-05-31 MED ORDER — SODIUM CHLORIDE 0.9% FLUSH: 3.0000 mL | INTRAVENOUS | Status: DC | PRN

## 2019-05-31 MED ORDER — ASPIRIN 81 MG PO CHEW
81.0000 mg | CHEWABLE_TABLET | ORAL | Status: AC
  Administered 2019-06-01: 81 mg via ORAL
  Filled 2019-05-31: qty 1

## 2019-05-31 MED ORDER — SODIUM CHLORIDE 0.9 % IV SOLN
INTRAVENOUS | Status: DC
  Administered 2019-06-01: 07:00:00 via INTRAVENOUS

## 2019-05-31 NOTE — Progress Notes (Addendum)
PROGRESS NOTE  Roger Ward DGL:[id number] DOB: 05-10-1981 DOA: 05/29/2019 PCP: Veryl Speak, FNP  HPI/Recap of past 24 hours:  Reports feeling better, less edema, less dyspnea on  exertion  He denies chest pain, no hypoxia at rest Negative 8liters  since admission  He reports he does not have a scale at home,  Assessment/Plan: Principal Problem:   Acute combined systolic and diastolic congestive heart failure (HCC) Active Problems:   OSA (obstructive sleep apnea)   Tobacco abuse   Acute hypoxemic respiratory failure (HCC)   Chronic renal insufficiency, stage III (moderate) (HCC)   Type II diabetes mellitus with renal manifestations (HCC)   Acute on chronic combined systolic (congestive) and diastolic (congestive) heart failure (HCC)   Morbid obesity with BMI of 50.0-59.9, adult (HCC)  Acute on chronic systolic CHF with acute hypoxic respiratory failure -o2 sats 87% on room air on presentation, he reports progressive dyspnea with walking from one room to another room ( at baseline he can walk down the driveway to get his mail), he reports increased edema -VQ scan low probability for PE -repeat echo cardiogram ef 40-45% -he received iv lasix bid since in the hospital, improving, lasix held in preparation for cardiac cath tomorrow  -continue coreg, bidil, entresto , spironolactone -cardiology /heart failure team consulted, input appreciated, will follow recommendation   CKDII -cr 1.23, likely close to baseline -monitor  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  am Blood sugar 100 today  H/o gout: stable , on colchicine daily at home which is continued    Morbid obesity/OSa on cpap Body mass index is 50.58 kg/m.   Tobacco abuse: -Did counseling about importance of quitting smoking -Nicotine patch   COVID19 screening negative  Code Status: full  Family Communication: patient    Disposition Plan: right/left heart cath planned on 6/15, home in 1-2 days with cardiology clearance    Consultants:  Cardiology   Procedures:  VQ scan  Cardiac cath planned on 6/15  Antibiotics:  none   Objective: BP (!) 126/96 (BP Location: Right Arm)   Pulse 87   Temp 98 F (36.7 C) (Oral)   Resp 17   Ht 6\' 1"  (1.854 m)   Wt (!) 173.9 kg   SpO2 95%   BMI 50.58 kg/m   Intake/Output Summary (Last 24 hours) at 05/31/2019 1406 Last data filed at 05/31/2019 1309 Gross per 24 hour  Intake 480 ml  Output 4975 ml  Net -4495 ml   Filed Weights   05/29/19 2027 05/30/19 0500 05/31/19 0521  Weight: (!) 177.4 kg (!) 179.3 kg (!) 173.9 kg    Exam: Patient is examined daily including today on 05/31/2019, exams remain the same as of yesterday except that has changed    General:  NAD, obese   Cardiovascular: RRR  Respiratory: CTABL  Abdomen: Soft/ND/NT, positive BS  Musculoskeletal: bilateral lower extremity pitting  Edema improving  Neuro: alert, oriented   Data Reviewed: Basic Metabolic Panel: Recent Labs  Lab 05/29/19 1710 05/30/19 0520 05/31/19 0547  NA 142 143 142  K 4.3 4.4 4.3  CL 99 97* 93*  CO2 31 36* 38*  GLUCOSE 135* 108* 100*  BUN 21* 18 16  CREATININE 1.23 1.25* 1.25*  CALCIUM 8.5* 8.7* 8.9  MG 2.0  --   --    Liver Function Tests: No results for input(s): AST, ALT, ALKPHOS, BILITOT, PROT, ALBUMIN in the last 168 hours. No  results for input(s): LIPASE, AMYLASE in the last 168 hours. No results for input(s): AMMONIA in the last 168 hours. CBC: Recent Labs  Lab 05/29/19 1710  WBC 6.8  HGB 15.7  HCT 52.0  MCV 86.4  PLT 290   Cardiac Enzymes:   Recent Labs  Lab 05/29/19 1710  TROPONINI <0.03   BNP (last 3 results) Recent Labs    05/29/19 1710  BNP 499.5*    ProBNP (last 3 results) No results for input(s): PROBNP in the last 8760 hours.  CBG: No results for input(s): GLUCAP in the last 168 hours.  Recent Results (from  the past 240 hour(s))  SARS Coronavirus 2 (CEPHEID- Performed in [facility] lab), Hosp Order     Status: None   Collection Time: 05/29/19  5:23 PM   Specimen: Nasopharyngeal Swab  Result Value Ref Range Status   SARS Coronavirus 2 NEGATIVE NEGATIVE Final    Comment: (NOTE) If result is NEGATIVE SARS-CoV-2 target nucleic acids are NOT DETECTED. The SARS-CoV-2 RNA is generally detectable in upper and lower  respiratory specimens during the acute phase of infection. The lowest  concentration of SARS-CoV-2 viral copies this assay can detect is 250  copies / mL. A negative result does not preclude SARS-CoV-2 infection  and should not be used as the sole basis for treatment or other  patient management decisions.  A negative result may occur with  improper specimen collection / handling, submission of specimen other  than nasopharyngeal swab, presence of viral mutation(s) within the  areas targeted by this assay, and inadequate number of viral copies  (<250 copies / mL). A negative result must be combined with clinical  observations, patient history, and epidemiological information. If result is POSITIVE SARS-CoV-2 target nucleic acids are DETECTED. The SARS-CoV-2 RNA is generally detectable in upper and lower  respiratory specimens dur ing the acute phase of infection.  Positive  results are indicative of active infection with SARS-CoV-2.  Clinical  correlation with patient history and other diagnostic information is  necessary to determine patient infection status.  Positive results do  not rule out bacterial infection or co-infection with other viruses. If result is PRESUMPTIVE POSTIVE SARS-CoV-2 nucleic acids MAY BE PRESENT.   A presumptive positive result was obtained on the submitted specimen  and confirmed on repeat testing.  While 2019 novel coronavirus  (SARS-CoV-2) nucleic acids may be present in the submitted sample  additional confirmatory testing may be necessary  for epidemiological  and / or clinical management purposes  to differentiate between  SARS-CoV-2 and other Sarbecovirus currently known to infect humans.  If clinically indicated additional testing with an alternate test  methodology [phone]([id number]) is advised. The SARS-CoV-2 RNA is generally  detectable in upper and lower respiratory sp ecimens during the acute  phase of infection. The expected result is Negative. Fact Sheet for Patients:  BoilerBrush.com.cyhttps://www.fda.gov/media/136312/download Fact Sheet for Healthcare Providers: https://pope.com/https://www.fda.gov/media/136313/download This test is not yet approved or cleared by the [place] FDA and has been authorized for detection and/or diagnosis of SARS-CoV-2 by FDA under an Emergency Use Authorization (EUA).  This EUA will remain in effect (meaning this test can be used) for the duration of the COVID-19 declaration under Section 564(b)(1) of the Act, 21 U.S.C. section 360bbb-3(b)(1), unless the authorization is terminated or revoked sooner. Performed at [facility], [address] [place], [place] [address]      Studies: No results found.  Scheduled Meds: . aspirin EC  81 mg Oral  Daily  . carvedilol  12.5 mg Oral BID  . colchicine  0.6 mg Oral Daily  . enoxaparin (LOVENOX) injection  40 mg Subcutaneous Q24H  . isosorbide-hydrALAZINE  1 tablet Oral TID  . nicotine  21 mg Transdermal Daily  . sacubitril-valsartan  1 tablet Oral BID  . sodium chloride flush  3 mL Intravenous Q12H  . spironolactone  50 mg Oral Daily    Continuous Infusions: . sodium chloride       Time spent: 13mins I have personally reviewed and interpreted on  05/31/2019 daily labs, tele strips, imagings as discussed above under date review session and assessment and plans.  I reviewed all nursing notes, pharmacy notes, consultant notes,  vitals, pertinent old records  I have discussed plan of care as described above with RN , patient on 05/31/2019   Florencia Reasons MD, PhD   [facility] Pager [phone]. If 7PM-7AM, please contact night-coverage at [email or web address], password [facility] 05/31/2019, 2:06 PM  LOS: 1 day

## 2019-05-31 NOTE — Progress Notes (Signed)
Advanced Heart Failure Rounding Note   Subjective:    Weight down 12 pounds. Says he feels better with diuresis. Less SOB. No orthopnea or PND. Bloating improving.   VQ negative for PE (Personally reviewed)   Echo with EF 40-45%. RV appears normal. There is septal flattening suggestive of RV pressure volume/overload. Personally reviewed   Objective:   Weight Range:  Vital Signs:   Temp:  [97.6 F (36.4 C)-98.9 F (37.2 C)] 97.6 F (36.4 C) (06/14 0521) Pulse Rate:  [67-74] 67 (06/14 0521) Resp:  [16-20] 20 (06/14 0521) BP: (100-142)/(63-85) 100/63 (06/14 0521) SpO2:  [93 %-97 %] 97 % (06/14 0521) Weight:  [173.9 kg] 173.9 kg (06/14 0521) Last BM Date: 05/29/19  Weight change: Filed Weights   05/29/19 2027 05/30/19 0500 05/31/19 0521  Weight: (!) 177.4 kg (!) 179.3 kg (!) 173.9 kg    Intake/Output:   Intake/Output Summary (Last 24 hours) at 05/31/2019 1255 Last data filed at 05/31/2019 1035 Gross per 24 hour  Intake 360 ml  Output 3300 ml  Net -2940 ml     Physical Exam: General:  Sitting in chair No resp difficulty HEENT: normal Neck: supple. JVP hard to see due to size . Carotids 2+ bilat; no bruits. No lymphadenopathy or thryomegaly appreciated. Cor: PMI nondisplaced. Regular rate & rhythm. No rubs, gallops or murmurs. Lungs: clear Abdomen: obese  soft, nontender, nondistended. No obvious hepatosplenomegaly. No bruits or masses. Good bowel sounds. Extremities: no cyanosis, clubbing, rash, edema Neuro: alert & orientedx3, cranial nerves grossly intact. moves all 4 extremities w/o difficulty. Affect pleasant  Telemetry: Sinus 60-70s Personally reviewed   Labs: Basic Metabolic Panel: Recent Labs  Lab 05/29/19 1710 05/30/19 0520 05/31/19 0547  NA 142 143 142  K 4.3 4.4 4.3  CL 99 97* 93*  CO2 31 36* 38*  GLUCOSE 135* 108* 100*  BUN 21* 18 16  CREATININE 1.23 1.25* 1.25*  CALCIUM 8.5* 8.7* 8.9  MG 2.0  --   --     Liver Function Tests: No  results for input(s): AST, ALT, ALKPHOS, BILITOT, PROT, ALBUMIN in the last 168 hours. No results for input(s): LIPASE, AMYLASE in the last 168 hours. No results for input(s): AMMONIA in the last 168 hours.  CBC: Recent Labs  Lab 05/29/19 1710  WBC 6.8  HGB 15.7  HCT 52.0  MCV 86.4  PLT 290    Cardiac Enzymes: Recent Labs  Lab 05/29/19 1710  TROPONINI <0.03    BNP: BNP (last 3 results) Recent Labs    05/29/19 1710  BNP 499.5*    ProBNP (last 3 results) No results for input(s): PROBNP in the last 8760 hours.    Other results:  Imaging: Nm Pulmonary Perfusion  Result Date: 05/30/2019 CLINICAL DATA:  Short of breath. Negative D-dimer. Congestive heart failure EXAM: NUCLEAR MEDICINE PERFUSION LUNG SCAN TECHNIQUE: Perfusion images were obtained in multiple projections after intravenous injection of radiopharmaceutical. RADIOPHARMACEUTICALS:  1.5 mCi Tc-46m MAA COMPARISON:  Chest radiograph 05/29/2011 FINDINGS: No wedge-shaped peripheral perfusion defects to localized acute pulmonary embolism. There is decreased relative perfusion to the LEFT lung related to the attenuation from the enlarged cardiac silhouette. IMPRESSION: 1. No evidence of acute of pulmonary embolism. 2. Marked cardiomegaly. Electronically Signed   By: Suzy Bouchard M.D.   On: 05/30/2019 13:32   Dg Chest Portable 1 View  Result Date: 05/29/2019 CLINICAL DATA:  Swelling. Shortness of breath today. History of CHF and hypertension. EXAM: PORTABLE CHEST 1 VIEW COMPARISON:  10/21/2017 FINDINGS: Mild enlargement of the cardiopericardial silhouette, stable. No mediastinal or hilar masses. No convincing adenopathy. Lungs are clear.  No pleural effusion or pneumothorax. Skeletal structures are grossly intact. IMPRESSION: 1. No acute cardiopulmonary disease. 2. Stable cardiomegaly. Electronically Signed   By: Amie Portland M.D.   On: 05/29/2019 18:00      Medications:     Scheduled Medications: . aspirin EC   81 mg Oral Daily  . carvedilol  12.5 mg Oral BID  . colchicine  0.6 mg Oral Daily  . enoxaparin (LOVENOX) injection  40 mg Subcutaneous Q24H  . furosemide  80 mg Intravenous BID  . isosorbide-hydrALAZINE  1 tablet Oral TID  . nicotine  21 mg Transdermal Daily  . sacubitril-valsartan  1 tablet Oral BID  . sodium chloride flush  3 mL Intravenous Q12H  . spironolactone  50 mg Oral Daily     Infusions: . sodium chloride       PRN Medications:  sodium chloride, acetaminophen, albuterol, dextromethorphan-guaiFENesin, hydrALAZINE, sodium chloride flush   Assessment/Plan:   1. Acute on chronic systolic CHF with prominent RV dysfunction: Echo in 2018 showed EF 35-40%, diffuse hypokinesis, D-shaped interventricular septum, moderately dysfunctional RV.  Suspect cor pulmonale.  Cause of RV failure uncertain, he does have significant OSA.  However, no evidence so far for OHS (oxygen saturation ok on room air during day so far). He is markedly volume overloaded on exam. Creatinine stable.  I/Os negative with Lasix overnight. He is on a good regimen of cardiac meds which I will continue.  - Volume status much improved though hard to assess well with size.  - Can stop IV lasix  - Continue Entresto 97/103 bid, spironolactone 50 daily, Bidil 1 tab tid, Coreg 12.5 mg bid.  - Echo 05/30/19 with EF 40-45%. RV appears normal. There is septal flattening suggestive of RV pressure volume/overload. Personally reviewed -V/Q negative.  - Will need R/L cath tomorrow .  2. OSA/?OHS: Continue nightly CPAP.  3. 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  Currently in NSR.  4. HTN: BP controlled. 5. Morbid obesity    Length of Stay: 1   Arvilla Meres MD 05/31/2019, 12:55 PM  Advanced Heart Failure Team Pager [phone] (M-F; 7a - 4p)  Please contact [facility] for night-coverage after hours (4p -7a ) and weekends on [email or web address]

## 2019-06-01 ENCOUNTER — Encounter (HOSPITAL_COMMUNITY)
Admission: EM | Disposition: A | Discharge status: Home / Self Care | Payer: Self-pay | Source: Home / Self Care | Attending: Internal Medicine

## 2019-06-01 HISTORY — PX: RIGHT/LEFT HEART CATH AND CORONARY ANGIOGRAPHY: CATH118266

## 2019-06-01 LAB — POCT I-STAT EG7
Acid-Base Excess: 10 mmol/L — ABNORMAL HIGH (ref 0.0–2.0)
Acid-Base Excess: 10 mmol/L — ABNORMAL HIGH (ref 0.0–2.0)
Acid-Base Excess: 7 mmol/L — ABNORMAL HIGH (ref 0.0–2.0)
Acid-Base Excess: 9 mmol/L — ABNORMAL HIGH (ref 0.0–2.0)
Bicarbonate: 33.6 mmol/L — ABNORMAL HIGH (ref 20.0–28.0)
Bicarbonate: 38.1 mmol/L — ABNORMAL HIGH (ref 20.0–28.0)
Bicarbonate: 38.4 mmol/L — ABNORMAL HIGH (ref 20.0–28.0)
Bicarbonate: 38.6 mmol/L — ABNORMAL HIGH (ref 20.0–28.0)
Calcium, Ion: 0.94 mmol/L — ABNORMAL LOW (ref 1.15–1.40)
Calcium, Ion: 1.1 mmol/L — ABNORMAL LOW (ref 1.15–1.40)
Calcium, Ion: 1.1 mmol/L — ABNORMAL LOW (ref 1.15–1.40)
Calcium, Ion: 1.11 mmol/L — ABNORMAL LOW (ref 1.15–1.40)
HCT: 48 % (ref 39.0–52.0)
HCT: 51 % (ref 39.0–52.0)
HCT: 52 % (ref 39.0–52.0)
HCT: 52 % (ref 39.0–52.0)
Hemoglobin: 16.3 g/dL (ref 13.0–17.0)
Hemoglobin: 17.3 g/dL — ABNORMAL HIGH (ref 13.0–17.0)
Hemoglobin: 17.7 g/dL — ABNORMAL HIGH (ref 13.0–17.0)
Hemoglobin: 17.7 g/dL — ABNORMAL HIGH (ref 13.0–17.0)
O2 Saturation: 69 %
O2 Saturation: 72 %
O2 Saturation: 73 %
O2 Saturation: 73 %
Potassium: 2.9 mmol/L — ABNORMAL LOW (ref 3.5–5.1)
Potassium: 3.3 mmol/L — ABNORMAL LOW (ref 3.5–5.1)
Potassium: 3.6 mmol/L (ref 3.5–5.1)
Potassium: 3.6 mmol/L (ref 3.5–5.1)
Sodium: 143 mmol/L (ref 135–145)
Sodium: 143 mmol/L (ref 135–145)
Sodium: 144 mmol/L (ref 135–145)
Sodium: 147 mmol/L — ABNORMAL HIGH (ref 135–145)
TCO2: 35 mmol/L — ABNORMAL HIGH (ref 22–32)
TCO2: 40 mmol/L — ABNORMAL HIGH (ref 22–32)
TCO2: 40 mmol/L — ABNORMAL HIGH (ref 22–32)
TCO2: 41 mmol/L — ABNORMAL HIGH (ref 22–32)
pCO2, Ven: 55.8 mmHg (ref 44.0–60.0)
pCO2, Ven: 63.5 mmHg — ABNORMAL HIGH (ref 44.0–60.0)
pCO2, Ven: 64.5 mmHg — ABNORMAL HIGH (ref 44.0–60.0)
pCO2, Ven: 68.1 mmHg — ABNORMAL HIGH (ref 44.0–60.0)
pH, Ven: 7.359 (ref 7.250–7.430)
pH, Ven: 7.385 (ref 7.250–7.430)
pH, Ven: 7.386 (ref 7.250–7.430)
pH, Ven: 7.388 (ref 7.250–7.430)
pO2, Ven: 38 mmHg (ref 32.0–45.0)
pO2, Ven: 40 mmHg (ref 32.0–45.0)
pO2, Ven: 40 mmHg (ref 32.0–45.0)
pO2, Ven: 42 mmHg (ref 32.0–45.0)

## 2019-06-01 LAB — POCT I-STAT 7, (LYTES, BLD GAS, ICA,H+H)
Acid-Base Excess: 7 mmol/L — ABNORMAL HIGH (ref 0.0–2.0)
Bicarbonate: 33.8 mmol/L — ABNORMAL HIGH (ref 20.0–28.0)
Calcium, Ion: 0.99 mmol/L — ABNORMAL LOW (ref 1.15–1.40)
HCT: 50 % (ref 39.0–52.0)
Hemoglobin: 17 g/dL (ref 13.0–17.0)
O2 Saturation: 90 %
Potassium: 3.3 mmol/L — ABNORMAL LOW (ref 3.5–5.1)
Sodium: 145 mmol/L (ref 135–145)
TCO2: 35 mmol/L — ABNORMAL HIGH (ref 22–32)
pCO2 arterial: 54.3 mmHg — ABNORMAL HIGH (ref 32.0–48.0)
pH, Arterial: 7.402 (ref 7.350–7.450)
pO2, Arterial: 59 mmHg — ABNORMAL LOW (ref 83.0–108.0)

## 2019-06-01 LAB — CREATININE, SERUM
Creatinine, Ser: 1.04 mg/dL (ref 0.61–1.24)
GFR calc Af Amer: >60 mL/min
GFR calc non Af Amer: >60 mL/min

## 2019-06-01 LAB — BASIC METABOLIC PANEL
Anion gap: 10 (ref 5–15)
BUN: 16 mg/dL (ref 6–20)
CO2: 37 mmol/L — ABNORMAL HIGH (ref 22–32)
Calcium: 9.2 mg/dL (ref 8.9–10.3)
Chloride: 94 mmol/L — ABNORMAL LOW (ref 98–111)
Creatinine, Ser: 1.25 mg/dL — ABNORMAL HIGH (ref 0.61–1.24)
GFR calc Af Amer: >60 mL/min (ref >60)
GFR calc non Af Amer: >60 mL/min (ref >60)
Glucose, Bld: 99 mg/dL (ref 70–99)
Potassium: 3.7 mmol/L (ref 3.5–5.1)
Sodium: 141 mmol/L (ref 135–145)

## 2019-06-01 LAB — CBC
HCT: 53.9 % — ABNORMAL HIGH (ref 39.0–52.0)
Hemoglobin: 16.7 g/dL (ref 13.0–17.0)
MCH: 25.8 pg — ABNORMAL LOW (ref 26.0–34.0)
MCHC: 31 g/dL (ref 30.0–36.0)
MCV: 83.2 fL (ref 80.0–100.0)
Platelets: 290 K/uL (ref 150–400)
RBC: 6.48 MIL/uL — ABNORMAL HIGH (ref 4.22–5.81)
RDW: 18.2 % — ABNORMAL HIGH (ref 11.5–15.5)
WBC: 4.4 K/uL (ref 4.0–10.5)
nRBC: 0 % (ref 0.0–0.2)

## 2019-06-01 LAB — MAGNESIUM: Magnesium: 2.3 mg/dL (ref 1.7–2.4)

## 2019-06-01 SURGERY — RIGHT/LEFT HEART CATH AND CORONARY ANGIOGRAPHY
Anesthesia: LOCAL

## 2019-06-01 MED ORDER — SODIUM CHLORIDE 0.9% FLUSH: 3.0000 mL | INTRAVENOUS | Status: DC | PRN

## 2019-06-01 MED ORDER — HEPARIN (PORCINE) IN NACL 1000-0.9 UT/500ML-% IV SOLN
INTRAVENOUS | Status: AC
  Filled 2019-06-01: qty 500

## 2019-06-01 MED ORDER — ACETAMINOPHEN 325 MG PO TABS: 650.0000 mg | ORAL_TABLET | ORAL | Status: DC | PRN

## 2019-06-01 MED ORDER — VERAPAMIL HCL 2.5 MG/ML IV SOLN
INTRAVENOUS | Status: AC
  Filled 2019-06-01: qty 2

## 2019-06-01 MED ORDER — SODIUM CHLORIDE 0.9% FLUSH
3.0000 mL | Freq: Two times a day (BID) | INTRAVENOUS | Status: DC
  Administered 2019-06-02 – 2019-06-04 (×4): 3 mL via INTRAVENOUS

## 2019-06-01 MED ORDER — HYDRALAZINE HCL 20 MG/ML IJ SOLN: 10.0000 mg | INTRAMUSCULAR | Status: AC | PRN

## 2019-06-01 MED ORDER — SODIUM CHLORIDE 0.9 % IV SOLN: INTRAVENOUS | Status: AC

## 2019-06-01 MED ORDER — ONDANSETRON HCL 4 MG/2ML IJ SOLN: 4.0000 mg | Freq: Four times a day (QID) | INTRAMUSCULAR | Status: DC | PRN

## 2019-06-01 MED ORDER — MIDAZOLAM HCL 2 MG/2ML IJ SOLN
INTRAMUSCULAR | Status: AC
  Filled 2019-06-01: qty 2

## 2019-06-01 MED ORDER — HEPARIN SODIUM (PORCINE) 1000 UNIT/ML IJ SOLN
INTRAMUSCULAR | Status: DC | PRN
  Administered 2019-06-01: 6000 [IU] via INTRAVENOUS

## 2019-06-01 MED ORDER — HEPARIN (PORCINE) IN NACL 1000-0.9 UT/500ML-% IV SOLN
INTRAVENOUS | Status: DC | PRN
  Administered 2019-06-01: 500 mL

## 2019-06-01 MED ORDER — LIDOCAINE HCL (PF) 1 % IJ SOLN
INTRAMUSCULAR | Status: DC | PRN
  Administered 2019-06-01: 2 mL
  Administered 2019-06-01: 1 mL

## 2019-06-01 MED ORDER — ENOXAPARIN SODIUM 100 MG/ML ~~LOC~~ SOLN
85.0000 mg | SUBCUTANEOUS | Status: DC
  Administered 2019-06-02 – 2019-06-03 (×2): 85 mg via SUBCUTANEOUS
  Filled 2019-06-01 (×2): qty 1

## 2019-06-01 MED ORDER — IOHEXOL 350 MG/ML SOLN
INTRAVENOUS | Status: DC | PRN
  Administered 2019-06-01: 60 mL via INTRA_ARTERIAL

## 2019-06-01 MED ORDER — LIDOCAINE HCL (PF) 1 % IJ SOLN
INTRAMUSCULAR | Status: AC
  Filled 2019-06-01: qty 30

## 2019-06-01 MED ORDER — HEPARIN SODIUM (PORCINE) 1000 UNIT/ML IJ SOLN
INTRAMUSCULAR | Status: AC
  Filled 2019-06-01: qty 1

## 2019-06-01 MED ORDER — FENTANYL CITRATE (PF) 100 MCG/2ML IJ SOLN
INTRAMUSCULAR | Status: AC
  Filled 2019-06-01: qty 2

## 2019-06-01 MED ORDER — LABETALOL HCL 5 MG/ML IV SOLN: 10.0000 mg | INTRAVENOUS | Status: AC | PRN

## 2019-06-01 MED ORDER — SODIUM CHLORIDE 0.9 % IV SOLN: 250.0000 mL | INTRAVENOUS | Status: DC | PRN

## 2019-06-01 MED ORDER — FUROSEMIDE 10 MG/ML IJ SOLN
80.0000 mg | Freq: Two times a day (BID) | INTRAMUSCULAR | Status: AC
  Administered 2019-06-01 – 2019-06-02 (×3): 80 mg via INTRAVENOUS
  Filled 2019-06-01 (×3): qty 8

## 2019-06-01 MED ORDER — MIDAZOLAM HCL 2 MG/2ML IJ SOLN
INTRAMUSCULAR | Status: DC | PRN
  Administered 2019-06-01: 1 mg via INTRAVENOUS

## 2019-06-01 MED ORDER — FENTANYL CITRATE (PF) 100 MCG/2ML IJ SOLN
INTRAMUSCULAR | Status: DC | PRN
  Administered 2019-06-01: 25 ug via INTRAVENOUS

## 2019-06-01 MED ORDER — VERAPAMIL HCL 2.5 MG/ML IV SOLN
INTRAVENOUS | Status: DC | PRN
  Administered 2019-06-01: 10 mL via INTRA_ARTERIAL

## 2019-06-01 SURGICAL SUPPLY — 14 items
CATH 5FR JL3.5 JR4 ANG PIG MP (CATHETERS) ×2 IMPLANT
CATH BALLN WEDGE 5F 110CM (CATHETERS) ×2 IMPLANT
CATH INFINITI 5FR AL1 (CATHETERS) ×2 IMPLANT
DEVICE RAD COMP TR BAND LRG (VASCULAR PRODUCTS) ×2 IMPLANT
GLIDESHEATH SLEND SS 6F .021 (SHEATH) ×2 IMPLANT
GUIDEWIRE .025 260CM (WIRE) ×2 IMPLANT
GUIDEWIRE INQWIRE 1.5J.035X260 (WIRE) ×1 IMPLANT
HOVERMATT SINGLE USE (MISCELLANEOUS) ×2 IMPLANT
INQWIRE 1.5J .035X260CM (WIRE) ×2
KIT HEART LEFT (KITS) ×2 IMPLANT
PACK CARDIAC CATHETERIZATION (CUSTOM PROCEDURE TRAY) ×2 IMPLANT
SHEATH GLIDE SLENDER 4/5FR (SHEATH) ×2 IMPLANT
TRANSDUCER W/STOPCOCK (MISCELLANEOUS) ×2 IMPLANT
TUBING CIL FLEX 10 FLL-RA (TUBING) ×2 IMPLANT

## 2019-06-01 NOTE — H&P (View-Only) (Signed)
Advanced Heart Failure Rounding Note   Subjective:    Weight down another 10 pounds - 22 pounds total.   He feels much better today. Less bloated. Only mild SOB with activity. No orthopnea or PND. For R/L cath today.   VQ negative for PE (Personally reviewed)   Echo with EF 40-45%. RV appears normal. There is septal flattening suggestive of RV pressure volume/overload. Personally reviewed   Objective:   Weight Range:  Vital Signs:   Temp:  [97.6 F (36.4 C)-98.6 F (37 C)] 97.6 F (36.4 C) (06/15 0603) Pulse Rate:  [71-87] 71 (06/15 0603) Resp:  [17-20] 20 (06/15 0603) BP: (121-126)/(64-96) 123/64 (06/15 0603) SpO2:  [88 %-95 %] 88 % (06/15 0603) Weight:  [169.4 kg] 169.4 kg (06/15 0603) Last BM Date: 05/29/19  Weight change: Filed Weights   05/30/19 0500 05/31/19 0521 06/01/19 0603  Weight: (!) 179.3 kg (!) 173.9 kg (!) 169.4 kg    Intake/Output:   Intake/Output Summary (Last 24 hours) at 06/01/2019 0847 Last data filed at 06/01/2019 0500 Gross per 24 hour  Intake 600 ml  Output 5750 ml  Net -5150 ml     Physical Exam: General:  Sitting in chair No resp difficulty HEENT: normal Neck: supple. No obvious JVD but hard to see with size..  Carotids 2+ bilat; no bruits. No lymphadenopathy or thryomegaly appreciated. Cor: PMI nondisplaced. Regular rate & rhythm. No rubs, gallops or murmurs. Lungs: clear Abdomen: markedly obese soft, nontender, nondistended. No hepatosplenomegaly. No bruits or masses. Good bowel sounds. Extremities: no cyanosis, clubbing, rash, edema Neuro: alert & orientedx3, cranial nerves grossly intact. moves all 4 extremities w/o difficulty. Affect pleasant  Telemetry: Sinus 70s Personally reviewed   Labs: Basic Metabolic Panel: Recent Labs  Lab 05/29/19 1710 05/30/19 0520 05/31/19 0547 06/01/19 0523  NA 142 143 142 141  K 4.3 4.4 4.3 3.7  CL 99 97* 93* 94*  CO2 31 36* 38* 37*  GLUCOSE 135* 108* 100* 99  BUN 21* 18 16 16    CREATININE 1.23 1.25* 1.25* 1.25*  CALCIUM 8.5* 8.7* 8.9 9.2  MG 2.0  --   --   --     Liver Function Tests: No results for input(s): AST, ALT, ALKPHOS, BILITOT, PROT, ALBUMIN in the last 168 hours. No results for input(s): LIPASE, AMYLASE in the last 168 hours. No results for input(s): AMMONIA in the last 168 hours.  CBC: Recent Labs  Lab 05/29/19 1710  WBC 6.8  HGB 15.7  HCT 52.0  MCV 86.4  PLT 290    Cardiac Enzymes: Recent Labs  Lab 05/29/19 1710  TROPONINI <0.03    BNP: BNP (last 3 results) Recent Labs    05/29/19 1710  BNP 499.5*    ProBNP (last 3 results) No results for input(s): PROBNP in the last 8760 hours.    Other results:  Imaging: Nm Pulmonary Perfusion  Result Date: 05/30/2019 CLINICAL DATA:  Short of breath. Negative D-dimer. Congestive heart failure EXAM: NUCLEAR MEDICINE PERFUSION LUNG SCAN TECHNIQUE: Perfusion images were obtained in multiple projections after intravenous injection of radiopharmaceutical. RADIOPHARMACEUTICALS:  1.5 mCi Tc-11m MAA COMPARISON:  Chest radiograph 05/29/2011 FINDINGS: No wedge-shaped peripheral perfusion defects to localized acute pulmonary embolism. There is decreased relative perfusion to the LEFT lung related to the attenuation from the enlarged cardiac silhouette. IMPRESSION: 1. No evidence of acute of pulmonary embolism. 2. Marked cardiomegaly. Electronically Signed   By: Suzy Bouchard M.D.   On: 05/30/2019 13:32  Medications:     Scheduled Medications: . aspirin EC  81 mg Oral Daily  . carvedilol  12.5 mg Oral BID  . colchicine  0.6 mg Oral Daily  . enoxaparin (LOVENOX) injection  40 mg Subcutaneous Q24H  . isosorbide-hydrALAZINE  1 tablet Oral TID  . nicotine  21 mg Transdermal Daily  . sacubitril-valsartan  1 tablet Oral BID  . sodium chloride flush  3 mL Intravenous Q12H  . sodium chloride flush  3 mL Intravenous Q12H  . spironolactone  50 mg Oral Daily    Infusions: . sodium chloride     . sodium chloride    . sodium chloride 10 mL/hr at 06/01/19 0634    PRN Medications: sodium chloride, sodium chloride, acetaminophen, albuterol, dextromethorphan-guaiFENesin, hydrALAZINE, sodium chloride flush, sodium chloride flush   Assessment/Plan:   1. Acute on chronic systolic CHF with prominent RV dysfunction: Echo in 2018 showed EF 35-40%, diffuse hypokinesis, D-shaped interventricular septum, moderately dysfunctional RV.  Suspect cor pulmonale.  Cause of RV failure uncertain, he does have significant OSA.  However, no evidence so far for OHS (oxygen saturation ok on room air during day so far).  - Volume status much improved weight down 25 pounds. Renal function stable. - Lasix on hold for cath today - Continue Entresto 97/103 bid, spironolactone 50 daily, Bidil 1 tab tid, Coreg 12.5 mg bid.  - Echo 05/30/19 with EF 40-45%. RV appears normal. There is septal flattening suggestive of RV pressure volume/overload. Personally reviewed - V/Q negative.  - R/L cath today. Discussed at length.  2. OSA/?OHS: Continue nightly CPAP.  3. 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  Currently in NSR.  4. HTN: BP controlled. 5. Morbid obesity - needs weight loss   Length of Stay: 2   Solly Derasmo MD 06/01/2019, 8:47 AM  Advanced Heart Failure Team Pager [phone] (M-F; 7a - 4p)  Please contact [facility] for night-coverage after hours (4p -7a ) and weekends on [email or web address]  

## 2019-06-01 NOTE — Telephone Encounter (Signed)
06/01/2019 Received signed [employer] FMLA Form back from Dr. Sallyanne Kuster.  I then enter-office it to [employer] to finish processing. cbr

## 2019-06-01 NOTE — Interval H&P Note (Signed)
History and Physical Interval Note:  06/01/2019 2:18 PM  Roger Ward  has presented today for surgery, with the diagnosis of HF.  The various methods of treatment have been discussed with the patient and family. After consideration of risks, benefits and other options for treatment, the patient has consented to  Procedure(s): RIGHT/LEFT HEART CATH AND CORONARY ANGIOGRAPHY (N/A) and possible coronary angioplasty as a surgical intervention.  The patient's history has been reviewed, patient examined, no change in status, stable for surgery.  I have reviewed the patient's chart and labs.  Questions were answered to the patient's satisfaction.     [name]

## 2019-06-01 NOTE — Progress Notes (Signed)
PROGRESS NOTE    Roger Ward  IOM:[id number] DOB: 28-Jul-1981 DOA: 05/29/2019 PCP: Veryl Speak, FNP   Brief Narrative: 38 y.o. male with medical history significant of CHF with EF 35-40%, hypertension, diet-controlled diabetes, OSA on CPAP, morbid obesity, tobacco abuse, gout, who presents with shortness of breath on 05/29/19 recently gained about 15 pounds of weight and having shortness of breath for several days, was progressively getting worse. In ER patient found to have hypoxia with saturation of 87% on room air, BNP 499- troponin stable renal function negative COVID-19.  He was admitted for further management.  Subjective: Seen this morning overall shortness of breath is improving, he was waiting for his cardiac cath.  Denies chest pain nausea vomiting.  Assessment & Plan:   Acute combined systolic and diastolic congestive heart failure: Suspect secondary to dietary indiscretion.  Patient reports not watching his salt and fluid intake closely, once using torsemide.  Overall volume status significantly improving, still has 2+ leg edema.  EF is 35 to 40% on echo with grade 1 diastolic dysfunction.  Cardiology following closely and appreciate input.  Continue IV diuretics, Entresto, carvedilol, Aldactone and isosorbide-hydralazine.  Added Today endpoint of nonischemic cardiomyopathy advised medical management.  He is on aspirin 81 mg. Filed Weights   05/30/19 0500 05/31/19 0521 06/01/19 0603  Weight: (!) 179.3 kg (!) 173.9 kg (!) 169.4 kg    OSA continue CPAP.  Tobacco abuse: Continue current patch.  Discussed tobacco cessation.  Acute hypoxemic respiratory failure: From CHF.  Resolved, currently on room air.  Chronic renal insufficiency, stage III: Renal function overall stable.  Monitor intermittently on diuretics. Recent Labs  Lab 05/29/19 1710 05/30/19 0520 05/31/19 0547 06/01/19 0523  BUN 21* 18 16 16   CREATININE 1.23 1.25* 1.25* 1.25*    Diabetes mellitus type 2,  noninsulin dependent, hba1c 6.5 last in 2016.  Diet controlled.  Blood sugar is stable.  Morbid obesity with BMI of 50.0-59.9, adult : Again discussed about weight loss, healthy lifestyle.  DVT prophylaxis:lovenox Code Status: Full code Family Communication: Plan of care discussed extensively with the patient  Disposition Plan: remains inpatient pending clinical improvement.   Consultants:  Cardiology   procedures: Cardiac cath: NICM 45% LVEF  Antimicrobials: Anti-infectives (From admission, onward)   None       Objective: Vitals:   06/01/19 1458 06/01/19 1503 06/01/19 1508 06/01/19 1513  BP: (!) 147/94 (!) 139/98 (!) 144/91 (!) 137/98  Pulse: 82 80 82 72  Resp: 14 (!) 22 (!) 9 (!) 21  Temp:      TempSrc:      SpO2: 92% 90% 90% 98%  Weight:      Height:        Intake/Output Summary (Last 24 hours) at 06/01/2019 1653 Last data filed at 06/01/2019 1403 Gross per 24 hour  Intake 480 ml  Output 2175 ml  Net -1695 ml   Filed Weights   05/30/19 0500 05/31/19 0521 06/01/19 0603  Weight: (!) 179.3 kg (!) 173.9 kg (!) 169.4 kg   Weight change: -4.491 kg  Body mass index is 49.28 kg/m.  Intake/Output from previous day: 06/14 0701 - 06/15 0700 In: 600 [P.O.:600] Out: 5800 [Urine:5800] Intake/Output this shift: Total I/O In: 0  Out: 500 [Urine:500]  Examination:  General exam: Appears calm and comfortable, morbidly obese,  HEENT:PERRL,Oral mucosa moist, Ear/Nose normal on gross exam Respiratory system: Bilateral equal air entry, normal vesicular breath sounds, no wheezes or crackles  Cardiovascular system: S1 & S2  heard,No JVD, murmurs. Gastrointestinal system: Abdomen is  soft, non tender, non distended, BS +  Nervous System:Alert and oriented. No focal neurological deficits/moving extremities, sensation intact. Extremities: Bilateral extensive lower leg edema/hyperpigmentation/hyperkeratosis with chronic appearing edema  Skin: No rashes, lesions, no icterus  MSK: Normal muscle bulk,tone ,power  Medications:  Scheduled Meds: . aspirin EC  81 mg Oral Daily  . carvedilol  12.5 mg Oral BID  . colchicine  0.6 mg Oral Daily  . [START ON 06/02/2019] enoxaparin (LOVENOX) injection  85 mg Subcutaneous Q24H  . furosemide  80 mg Intravenous BID  . isosorbide-hydrALAZINE  1 tablet Oral TID  . nicotine  21 mg Transdermal Daily  . sacubitril-valsartan  1 tablet Oral BID  . sodium chloride flush  3 mL Intravenous Q12H  . sodium chloride flush  3 mL Intravenous Q12H  . spironolactone  50 mg Oral Daily   Continuous Infusions: . sodium chloride    . sodium chloride    . sodium chloride      Data Reviewed: I have personally reviewed following labs and imaging studies  CBC: Recent Labs  Lab 05/29/19 1710 06/01/19 1428 06/01/19 1430 06/01/19 1436 06/01/19 1437 06/01/19 1448  WBC 6.8  --   --   --   --   --   HGB 15.7 17.0 17.3* 17.7* 17.7* 16.3  HCT 52.0 50.0 51.0 52.0 52.0 48.0  MCV 86.4  --   --   --   --   --   PLT 290  --   --   --   --   --    Basic Metabolic Panel: Recent Labs  Lab 05/29/19 1710 05/30/19 0520 05/31/19 0547 06/01/19 0523 06/01/19 1428 06/01/19 1430 06/01/19 1436 06/01/19 1437 06/01/19 1448  NA 142 143 142 141 145 144 143 143 147*  K 4.3 4.4 4.3 3.7 3.3* 3.3* 3.6 3.6 2.9*  CL 99 97* 93* 94*  --   --   --   --   --   CO2 31 36* 38* 37*  --   --   --   --   --   GLUCOSE 135* 108* 100* 99  --   --   --   --   --   BUN 21* 18 16 16   --   --   --   --   --   CREATININE 1.23 1.25* 1.25* 1.25*  --   --   --   --   --   CALCIUM 8.5* 8.7* 8.9 9.2  --   --   --   --   --   MG 2.0  --   --  2.3  --   --   --   --   --    GFR: Estimated Creatinine Clearance: 132.4 mL/min (A) (by C-G formula based on SCr of 1.25 mg/dL (H)). Liver Function Tests: No results for input(s): AST, ALT, ALKPHOS, BILITOT, PROT, ALBUMIN in the last 168 hours. No results for input(s): LIPASE, AMYLASE in the last 168 hours. No results for  input(s): AMMONIA in the last 168 hours. Coagulation Profile: No results for input(s): INR, PROTIME in the last 168 hours. Cardiac Enzymes: Recent Labs  Lab 05/29/19 1710  TROPONINI <0.03   BNP (last 3 results) No results for input(s): PROBNP in the last 8760 hours. HbA1C: No results for input(s): HGBA1C in the last 72 hours. CBG: No results for input(s): GLUCAP in the last 168 hours. Lipid Profile: No results  for input(s): CHOL, HDL, LDLCALC, TRIG, CHOLHDL, LDLDIRECT in the last 72 hours. Thyroid Function Tests: No results for input(s): TSH, T4TOTAL, FREET4, T3FREE, THYROIDAB in the last 72 hours. Anemia Panel: No results for input(s): VITAMINB12, FOLATE, FERRITIN, TIBC, IRON, RETICCTPCT in the last 72 hours. Sepsis Labs: No results for input(s): PROCALCITON, LATICACIDVEN in the last 168 hours.  Recent Results (from the past 240 hour(s))  SARS Coronavirus 2 (CEPHEID- Performed in [place] hospital lab), Hosp Order     Status: None   Collection Time: 05/29/19  5:23 PM   Specimen: Nasopharyngeal Swab  Result Value Ref Range Status   SARS Coronavirus 2 NEGATIVE NEGATIVE Final    Comment: (NOTE) If result is NEGATIVE SARS-CoV-2 target nucleic acids are NOT DETECTED. The SARS-CoV-2 RNA is generally detectable in upper and lower  respiratory specimens during the acute phase of infection. The lowest  concentration of SARS-CoV-2 viral copies this assay can detect is 250  copies / mL. A negative result does not preclude SARS-CoV-2 infection  and should not be used as the sole basis for treatment or other  patient management decisions.  A negative result may occur with  improper specimen collection / handling, submission of specimen other  than nasopharyngeal swab, presence of viral mutation(s) within the  areas targeted by this assay, and inadequate number of viral copies  (<250 copies / mL). A negative result must be combined with clinical  observations, patient history, and  epidemiological information. If result is POSITIVE SARS-CoV-2 target nucleic acids are DETECTED. The SARS-CoV-2 RNA is generally detectable in upper and lower  respiratory specimens dur ing the acute phase of infection.  Positive  results are indicative of active infection with SARS-CoV-2.  Clinical  correlation with patient history and other diagnostic information is  necessary to determine patient infection status.  Positive results do  not rule out bacterial infection or co-infection with other viruses. If result is PRESUMPTIVE POSTIVE SARS-CoV-2 nucleic acids MAY BE PRESENT.   A presumptive positive result was obtained on the submitted specimen  and confirmed on repeat testing.  While 2019 novel coronavirus  (SARS-CoV-2) nucleic acids may be present in the submitted sample  additional confirmatory testing may be necessary for epidemiological  and / or clinical management purposes  to differentiate between  SARS-CoV-2 and other Sarbecovirus currently known to infect humans.  If clinically indicated additional testing with an alternate test  methodology [phone]) is advised. The SARS-CoV-2 RNA is generally  detectable in upper and lower respiratory sp ecimens during the acute  phase of infection. The expected result is Negative. Fact Sheet for Patients:  StrictlyIdeas.no Fact Sheet for Healthcare Providers: BankingDealers.co.za This test is not yet approved or cleared by the [place] FDA and has been authorized for detection and/or diagnosis of SARS-CoV-2 by FDA under an Emergency Use Authorization (EUA).  This EUA will remain in effect (meaning this test can be used) for the duration of the COVID-19 declaration under Section 564(b)(1) of the Act, 21 U.S.C. section 360bbb-3(b)(1), unless the authorization is terminated or revoked sooner. Performed at [facility], [place] [address], [place], [place] [address]        Radiology Studies: No results found.    LOS: 2 days   Time spent: More than 50% of that time was spent in counseling and/or coordination of care.  Antonieta Pert, MD [facility]  06/01/2019, 4:53 PM

## 2019-06-01 NOTE — Progress Notes (Signed)
Advanced Heart Failure Rounding Note   Subjective:    Weight down another 10 pounds - 22 pounds total.   He feels much better today. Less bloated. Only mild SOB with activity. No orthopnea or PND. For R/L cath today.   VQ negative for PE (Personally reviewed)   Echo with EF 40-45%. RV appears normal. There is septal flattening suggestive of RV pressure volume/overload. Personally reviewed   Objective:   Weight Range:  Vital Signs:   Temp:  [97.6 F (36.4 C)-98.6 F (37 C)] 97.6 F (36.4 C) (06/15 0603) Pulse Rate:  [71-87] 71 (06/15 0603) Resp:  [17-20] 20 (06/15 0603) BP: (121-126)/(64-96) 123/64 (06/15 0603) SpO2:  [88 %-95 %] 88 % (06/15 0603) Weight:  [169.4 kg] 169.4 kg (06/15 0603) Last BM Date: 05/29/19  Weight change: Filed Weights   05/30/19 0500 05/31/19 0521 06/01/19 0603  Weight: (!) 179.3 kg (!) 173.9 kg (!) 169.4 kg    Intake/Output:   Intake/Output Summary (Last 24 hours) at 06/01/2019 0847 Last data filed at 06/01/2019 0500 Gross per 24 hour  Intake 600 ml  Output 5750 ml  Net -5150 ml     Physical Exam: General:  Sitting in chair No resp difficulty HEENT: normal Neck: supple. No obvious JVD but hard to see with size..  Carotids 2+ bilat; no bruits. No lymphadenopathy or thryomegaly appreciated. Cor: PMI nondisplaced. Regular rate & rhythm. No rubs, gallops or murmurs. Lungs: clear Abdomen: markedly obese soft, nontender, nondistended. No hepatosplenomegaly. No bruits or masses. Good bowel sounds. Extremities: no cyanosis, clubbing, rash, edema Neuro: alert & orientedx3, cranial nerves grossly intact. moves all 4 extremities w/o difficulty. Affect pleasant  Telemetry: Sinus 70s Personally reviewed   Labs: Basic Metabolic Panel: Recent Labs  Lab 05/29/19 1710 05/30/19 0520 05/31/19 0547 06/01/19 0523  NA 142 143 142 141  K 4.3 4.4 4.3 3.7  CL 99 97* 93* 94*  CO2 31 36* 38* 37*  GLUCOSE 135* 108* 100* 99  BUN 21* 18 16 16    CREATININE 1.23 1.25* 1.25* 1.25*  CALCIUM 8.5* 8.7* 8.9 9.2  MG 2.0  --   --   --     Liver Function Tests: No results for input(s): AST, ALT, ALKPHOS, BILITOT, PROT, ALBUMIN in the last 168 hours. No results for input(s): LIPASE, AMYLASE in the last 168 hours. No results for input(s): AMMONIA in the last 168 hours.  CBC: Recent Labs  Lab 05/29/19 1710  WBC 6.8  HGB 15.7  HCT 52.0  MCV 86.4  PLT 290    Cardiac Enzymes: Recent Labs  Lab 05/29/19 1710  TROPONINI <0.03    BNP: BNP (last 3 results) Recent Labs    05/29/19 1710  BNP 499.5*    ProBNP (last 3 results) No results for input(s): PROBNP in the last 8760 hours.    Other results:  Imaging: Nm Pulmonary Perfusion  Result Date: 05/30/2019 CLINICAL DATA:  Short of breath. Negative D-dimer. Congestive heart failure EXAM: NUCLEAR MEDICINE PERFUSION LUNG SCAN TECHNIQUE: Perfusion images were obtained in multiple projections after intravenous injection of radiopharmaceutical. RADIOPHARMACEUTICALS:  1.5 mCi Tc-11m MAA COMPARISON:  Chest radiograph 05/29/2011 FINDINGS: No wedge-shaped peripheral perfusion defects to localized acute pulmonary embolism. There is decreased relative perfusion to the LEFT lung related to the attenuation from the enlarged cardiac silhouette. IMPRESSION: 1. No evidence of acute of pulmonary embolism. 2. Marked cardiomegaly. Electronically Signed   By: Suzy Bouchard M.D.   On: 05/30/2019 13:32  Medications:     Scheduled Medications: . aspirin EC  81 mg Oral Daily  . carvedilol  12.5 mg Oral BID  . colchicine  0.6 mg Oral Daily  . enoxaparin (LOVENOX) injection  40 mg Subcutaneous Q24H  . isosorbide-hydrALAZINE  1 tablet Oral TID  . nicotine  21 mg Transdermal Daily  . sacubitril-valsartan  1 tablet Oral BID  . sodium chloride flush  3 mL Intravenous Q12H  . sodium chloride flush  3 mL Intravenous Q12H  . spironolactone  50 mg Oral Daily    Infusions: . sodium chloride     . sodium chloride    . sodium chloride 10 mL/hr at 06/01/19 0634    PRN Medications: sodium chloride, sodium chloride, acetaminophen, albuterol, dextromethorphan-guaiFENesin, hydrALAZINE, sodium chloride flush, sodium chloride flush   Assessment/Plan:   1. Acute on chronic systolic CHF with prominent RV dysfunction: Echo in 2018 showed EF 35-40%, diffuse hypokinesis, D-shaped interventricular septum, moderately dysfunctional RV.  Suspect cor pulmonale.  Cause of RV failure uncertain, he does have significant OSA.  However, no evidence so far for OHS (oxygen saturation ok on room air during day so far).  - Volume status much improved weight down 25 pounds. Renal function stable. - Lasix on hold for cath today - Continue Entresto 97/103 bid, spironolactone 50 daily, Bidil 1 tab tid, Coreg 12.5 mg bid.  - Echo 05/30/19 with EF 40-45%. RV appears normal. There is septal flattening suggestive of RV pressure volume/overload. Personally reviewed - V/Q negative.  - R/L cath today. Discussed at length.  2. OSA/?OHS: Continue nightly CPAP.  3. 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  Currently in NSR.  4. HTN: BP controlled. 5. Morbid obesity - needs weight loss   Length of Stay: 2   Arvilla Meres MD 06/01/2019, 8:47 AM  Advanced Heart Failure Team Pager [phone] (M-F; 7a - 4p)  Please contact [facility] for night-coverage after hours (4p -7a ) and weekends on [email or web address]

## 2019-06-02 ENCOUNTER — Encounter (HOSPITAL_COMMUNITY): Payer: Self-pay | Admitting: Internal Medicine

## 2019-06-02 LAB — BASIC METABOLIC PANEL
Anion gap: 12 (ref 5–15)
BUN: 17 mg/dL (ref 6–20)
CO2: 32 mmol/L (ref 22–32)
Calcium: 8.8 mg/dL — ABNORMAL LOW (ref 8.9–10.3)
Chloride: 94 mmol/L — ABNORMAL LOW (ref 98–111)
Creatinine, Ser: 1.16 mg/dL (ref 0.61–1.24)
GFR calc Af Amer: >60 mL/min (ref >60)
GFR calc non Af Amer: >60 mL/min (ref >60)
Glucose, Bld: 105 mg/dL — ABNORMAL HIGH (ref 70–99)
Potassium: 3.9 mmol/L (ref 3.5–5.1)
Sodium: 138 mmol/L (ref 135–145)

## 2019-06-02 MED ORDER — TORSEMIDE 20 MG PO TABS
40.0000 mg | ORAL_TABLET | Freq: Two times a day (BID) | ORAL | Status: DC
  Administered 2019-06-03 – 2019-06-04 (×3): 40 mg via ORAL
  Filled 2019-06-02 (×3): qty 2

## 2019-06-02 NOTE — Progress Notes (Signed)
PROGRESS NOTE    Roger BasqueJerome L Ward  VHQ:[id number] DOB: 06/16/1981 DOA: 05/29/2019 PCP: Veryl Speakalone, Gregory D, FNP   Brief Narrative: 38 y.o. male with medical history significant of CHF with EF 35-40%, hypertension, diet-controlled diabetes, OSA on CPAP, morbid obesity, tobacco abuse, gout, who presents with shortness of breath on 05/29/19 recently gained about 15 pounds of weight and having shortness of breath for several days, was progressively getting worse. In ER patient found to have hypoxia with saturation of 87% on room air, BNP 499- troponin stable renal function negative COVID-19.  He was admitted for further management. Patient is being diuresed with IV Lasix.  Seen by cardiology underwent cardiac cath with EF 45% nonischemic cardiomyopathy. 6/16:Remains on diuresis  Subjective: Patient feels improving overall.  No chest pain, shortness of breath.  He still have leg edema.  Assessment & Plan:   Acute on chronic systolic CHF with prominent RV dysfunction: also with dietary indiscretion at home.  Patient reports not watching his salt and fluid intake closely but using torsemide.  Also suspecting cor pulmonale.  overall volume status significantly improving, weight down to 365 pounds dry weight around 350-358 pounds.  Underwent right and left heart cath as below.  Discussed with cardiology recommends for at least 1 more day of IV diuresis.  Continue Entresto 97/103 twice daily, Aldactone 50 daily, BIDL 1 tab TID, Coreg 12.5 big. EF is 35 to 40% on echo with grade 1 diastolic dysfunction.  Anticipating discharge diuretics torsemide 40 twice daily Filed Weights   05/31/19 0521 06/01/19 0603 06/02/19 0600  Weight: (!) 173.9 kg (!) 169.4 kg (!) 165.9 kg   OSA continue CPAP.  Satturatin well on room air.  Tobacco abuse: Continue current patch.  Discussed tobacco cessation.  Acute hypoxemic respiratory failure: From CHF.  Resolved, currently on room air.  Chronic renal insufficiency, stage III:  Renal function overall stable.  Monitor intermittently on diuretics. Recent Labs  Lab 05/29/19 1710 05/30/19 0520 05/31/19 0547 06/01/19 0523 06/01/19 1648 06/02/19 0526  BUN 21* 18 16 16   --  17  CREATININE 1.23 1.25* 1.25* 1.25* 1.04 1.16    Diabetes mellitus type 2, noninsulin dependent, hba1c 6.5 last in 2016.  Diet controlled.  Blood sugar is stable.  Morbid obesity with BMI of 50.0-59.9, adult : Again discussed about weight loss, healthy lifestyle.  DVT prophylaxis:lovenox Code Status: Full code Family Communication: Plan of care discussed extensively with the patient  Disposition Plan: remains inpatient pending clinical improvement.  Anticipate discharge next 24 hours if remains stable.   Consultants:  Cardiology   procedures: Cardiac cath: NICM 45% LVEF  Antimicrobials: Anti-infectives (From admission, onward)   None     Objective: Vitals:   06/01/19 1630 06/02/19 0600 06/02/19 0614 06/02/19 1202  BP: (!) 132/95  109/71 109/79  Pulse: 64  70 71  Resp: 20   20  Temp: (!) 97.4 F (36.3 C)  98.8 F (37.1 C) 98.2 F (36.8 C)  TempSrc: Oral  Oral Oral  SpO2:   97% 99%  Weight:  (!) 165.9 kg    Height:        Intake/Output Summary (Last 24 hours) at 06/02/2019 1542 Last data filed at 06/02/2019 1202 Gross per 24 hour  Intake 990 ml  Output 2800 ml  Net -1810 ml   Filed Weights   05/31/19 0521 06/01/19 0603 06/02/19 0600  Weight: (!) 173.9 kg (!) 169.4 kg (!) 165.9 kg   Weight change: -3.538 kg  Body mass index  is 48.25 kg/m.  Intake/Output from previous day: 06/15 0701 - 06/16 0700 In: 750 [P.O.:750] Out: 2350 [Urine:2350] Intake/Output this shift: Total I/O In: 240 [P.O.:240] Out: 950 [Urine:950]  Examination:  General exam: Appears calm and comfortable, obese, not in acute distress.  HEENT:PERRL,Oral mucosa moist, Ear/Nose normal on gross exam Respiratory system: Bilateral equal air entry, normal vesicular breath sounds, no wheezes or  crackles  Cardiovascular system: S1 & S2 heard,No JVD, murmurs. Gastrointestinal system: Abdomen is  soft, non tender, non distended, BS +  Nervous System:Alert and oriented. No focal neurological deficits/moving extremities, sensation intact. Extremities: Bilateral lower extremity edema hyperpigmentation chronic skin changes  Skin: No rashes, lesions, no icterus MSK: Normal muscle bulk,tone ,power  Medications:  Scheduled Meds: . aspirin EC  81 mg Oral Daily  . carvedilol  12.5 mg Oral BID  . colchicine  0.6 mg Oral Daily  . enoxaparin (LOVENOX) injection  85 mg Subcutaneous Q24H  . furosemide  80 mg Intravenous BID  . isosorbide-hydrALAZINE  1 tablet Oral TID  . nicotine  21 mg Transdermal Daily  . sacubitril-valsartan  1 tablet Oral BID  . sodium chloride flush  3 mL Intravenous Q12H  . sodium chloride flush  3 mL Intravenous Q12H  . spironolactone  50 mg Oral Daily  . [START ON 06/03/2019] torsemide  40 mg Oral BID   Continuous Infusions: . sodium chloride    . sodium chloride      Data Reviewed: I have personally reviewed following labs and imaging studies  CBC: Recent Labs  Lab 05/29/19 1710  06/01/19 1430 06/01/19 1436 06/01/19 1437 06/01/19 1448 06/01/19 1648  WBC 6.8  --   --   --   --   --  4.4  HGB 15.7   < > 17.3* 17.7* 17.7* 16.3 16.7  HCT 52.0   < > 51.0 52.0 52.0 48.0 53.9*  MCV 86.4  --   --   --   --   --  83.2  PLT 290  --   --   --   --   --  290   < > = values in this interval not displayed.   Basic Metabolic Panel: Recent Labs  Lab 05/29/19 1710 05/30/19 0520 05/31/19 0547 06/01/19 0523  06/01/19 1430 06/01/19 1436 06/01/19 1437 06/01/19 1448 06/01/19 1648 06/02/19 0526  NA 142 143 142 141   < > 144 143 143 147*  --  138  K 4.3 4.4 4.3 3.7   < > 3.3* 3.6 3.6 2.9*  --  3.9  CL 99 97* 93* 94*  --   --   --   --   --   --  94*  CO2 31 36* 38* 37*  --   --   --   --   --   --  32  GLUCOSE 135* 108* 100* 99  --   --   --   --   --   --   105*  BUN 21* 18 16 16   --   --   --   --   --   --  17  CREATININE 1.23 1.25* 1.25* 1.25*  --   --   --   --   --  1.04 1.16  CALCIUM 8.5* 8.7* 8.9 9.2  --   --   --   --   --   --  8.8*  MG 2.0  --   --  2.3  --   --   --   --   --   --   --    < > =  values in this interval not displayed.   GFR: Estimated Creatinine Clearance: 141 mL/min (by C-G formula based on SCr of 1.16 mg/dL). Liver Function Tests: No results for input(s): AST, ALT, ALKPHOS, BILITOT, PROT, ALBUMIN in the last 168 hours. No results for input(s): LIPASE, AMYLASE in the last 168 hours. No results for input(s): AMMONIA in the last 168 hours. Coagulation Profile: No results for input(s): INR, PROTIME in the last 168 hours. Cardiac Enzymes: Recent Labs  Lab 05/29/19 1710  TROPONINI <0.03   BNP (last 3 results) No results for input(s): PROBNP in the last 8760 hours. HbA1C: No results for input(s): HGBA1C in the last 72 hours. CBG: No results for input(s): GLUCAP in the last 168 hours. Lipid Profile: No results for input(s): CHOL, HDL, LDLCALC, TRIG, CHOLHDL, LDLDIRECT in the last 72 hours. Thyroid Function Tests: No results for input(s): TSH, T4TOTAL, FREET4, T3FREE, THYROIDAB in the last 72 hours. Anemia Panel: No results for input(s): VITAMINB12, FOLATE, FERRITIN, TIBC, IRON, RETICCTPCT in the last 72 hours. Sepsis Labs: No results for input(s): PROCALCITON, LATICACIDVEN in the last 168 hours.  Recent Results (from the past 240 hour(s))  SARS Coronavirus 2 (CEPHEID- Performed in [facility] lab), Hosp Order     Status: None   Collection Time: 05/29/19  5:23 PM   Specimen: Nasopharyngeal Swab  Result Value Ref Range Status   SARS Coronavirus 2 NEGATIVE NEGATIVE Final    Comment: (NOTE) If result is NEGATIVE SARS-CoV-2 target nucleic acids are NOT DETECTED. The SARS-CoV-2 RNA is generally detectable in upper and lower  respiratory specimens during the acute phase of infection. The lowest   concentration of SARS-CoV-2 viral copies this assay can detect is 250  copies / mL. A negative result does not preclude SARS-CoV-2 infection  and should not be used as the sole basis for treatment or other  patient management decisions.  A negative result may occur with  improper specimen collection / handling, submission of specimen other  than nasopharyngeal swab, presence of viral mutation(s) within the  areas targeted by this assay, and inadequate number of viral copies  (<250 copies / mL). A negative result must be combined with clinical  observations, patient history, and epidemiological information. If result is POSITIVE SARS-CoV-2 target nucleic acids are DETECTED. The SARS-CoV-2 RNA is generally detectable in upper and lower  respiratory specimens dur ing the acute phase of infection.  Positive  results are indicative of active infection with SARS-CoV-2.  Clinical  correlation with patient history and other diagnostic information is  necessary to determine patient infection status.  Positive results do  not rule out bacterial infection or co-infection with other viruses. If result is PRESUMPTIVE POSTIVE SARS-CoV-2 nucleic acids MAY BE PRESENT.   A presumptive positive result was obtained on the submitted specimen  and confirmed on repeat testing.  While 2019 novel coronavirus  (SARS-CoV-2) nucleic acids may be present in the submitted sample  additional confirmatory testing may be necessary for epidemiological  and / or clinical management purposes  to differentiate between  SARS-CoV-2 and other Sarbecovirus currently known to infect humans.  If clinically indicated additional testing with an alternate test  methodology [phone]([id number]) is advised. The SARS-CoV-2 RNA is generally  detectable in upper and lower respiratory sp ecimens during the acute  phase of infection. The expected result is Negative. Fact Sheet for Patients:  BoilerBrush.com.cyhttps://www.fda.gov/media/136312/download Fact Sheet  for Healthcare Providers: https://pope.com/https://www.fda.gov/media/136313/download This test is not yet approved or cleared by the [place] FDA and has  been authorized for detection and/or diagnosis of SARS-CoV-2 by FDA under an Emergency Use Authorization (EUA).  This EUA will remain in effect (meaning this test can be used) for the duration of the COVID-19 declaration under Section 564(b)(1) of the Act, 21 U.S.C. section 360bbb-3(b)(1), unless the authorization is terminated or revoked sooner. Performed at [facility], [place] [address], [place], [place] [address]       Radiology Studies: No results found.    LOS: 3 days   Time spent: More than 50% of that time was spent in counseling and/or coordination of care.  Antonieta Pert, MD [facility]  06/02/2019, 3:42 PM

## 2019-06-02 NOTE — Progress Notes (Signed)
Advanced Heart Failure Rounding Note   Subjective:    Underwent R/L cath on 6/15. Normal cors. Elevated R-sided pressures with normal PCWP and CO. Diuresed another 8 pounds overnight. Down 30 pounds total. Renal function normal.   Normal cors.  Ao = 123/88 (104)  LV =  118/23 RA =  22 RV = 58/25 PA = 56/20 (38) PCW = 14 Fick cardiac output/index = 10.3/3.7 PVR = 2.3 Ao sat = 90% PA sat = 72%, 69%  VQ negative for PE (Personally reviewed)   Echo with EF 40-45%. RV appears normal. There is septal flattening suggestive of RV pressure volume/overload. Personally reviewed   Objective:   Weight Range:  Vital Signs:   Temp:  [97.4 F (36.3 C)-98.8 F (37.1 C)] 98.2 F (36.8 C) (06/16 1202) Pulse Rate:  [64-91] 71 (06/16 1202) Resp:  [9-28] 20 (06/16 1202) BP: (109-147)/(71-98) 109/79 (06/16 1202) SpO2:  [90 %-99 %] 99 % (06/16 1202) Weight:  [165.9 kg] 165.9 kg (06/16 0600) Last BM Date: 05/29/19  Weight change: Filed Weights   05/31/19 0521 06/01/19 0603 06/02/19 0600  Weight: (!) 173.9 kg (!) 169.4 kg (!) 165.9 kg    Intake/Output:   Intake/Output Summary (Last 24 hours) at 06/02/2019 1421 Last data filed at 06/02/2019 1202 Gross per 24 hour  Intake 990 ml  Output 2800 ml  Net -1810 ml     Physical Exam: General:  Well appearing. No resp difficulty HEENT: normal Neck: supple. Unable to see JVD  Carotids 2+ bilat; no bruits. No lymphadenopathy or thryomegaly appreciated. Cor: PMI nondisplaced. Regular rate & rhythm. No rubs, gallops or murmurs. Lungs: clear Abdomen: obese soft, nontender, nondistended.. No bruits or masses. Good bowel sounds. Extremities: no cyanosis, clubbing, rash, edema Neuro: alert & orientedx3, cranial nerves grossly intact. moves all 4 extremities w/o difficulty. Affect pleasant   Telemetry: Sinus 70-80s Personally reviewed   Labs: Basic Metabolic Panel: Recent Labs  Lab 05/29/19 1710 05/30/19 0520 05/31/19 0547 06/01/19  0523  06/01/19 1430 06/01/19 1436 06/01/19 1437 06/01/19 1448 06/01/19 1648 06/02/19 0526  NA 142 143 142 141   < > 144 143 143 147*  --  138  K 4.3 4.4 4.3 3.7   < > 3.3* 3.6 3.6 2.9*  --  3.9  CL 99 97* 93* 94*  --   --   --   --   --   --  94*  CO2 31 36* 38* 37*  --   --   --   --   --   --  32  GLUCOSE 135* 108* 100* 99  --   --   --   --   --   --  105*  BUN 21* 18 16 16   --   --   --   --   --   --  17  CREATININE 1.23 1.25* 1.25* 1.25*  --   --   --   --   --  1.04 1.16  CALCIUM 8.5* 8.7* 8.9 9.2  --   --   --   --   --   --  8.8*  MG 2.0  --   --  2.3  --   --   --   --   --   --   --    < > = values in this interval not displayed.    Liver Function Tests: No results for input(s): AST, ALT, ALKPHOS, BILITOT, PROT, ALBUMIN in  the last 168 hours. No results for input(s): LIPASE, AMYLASE in the last 168 hours. No results for input(s): AMMONIA in the last 168 hours.  CBC: Recent Labs  Lab 05/29/19 1710  06/01/19 1430 06/01/19 1436 06/01/19 1437 06/01/19 1448 06/01/19 1648  WBC 6.8  --   --   --   --   --  4.4  HGB 15.7   < > 17.3* 17.7* 17.7* 16.3 16.7  HCT 52.0   < > 51.0 52.0 52.0 48.0 53.9*  MCV 86.4  --   --   --   --   --  83.2  PLT 290  --   --   --   --   --  290   < > = values in this interval not displayed.    Cardiac Enzymes: Recent Labs  Lab 05/29/19 1710  TROPONINI <0.03    BNP: BNP (last 3 results) Recent Labs    05/29/19 1710  BNP 499.5*    ProBNP (last 3 results) No results for input(s): PROBNP in the last 8760 hours.    Other results:  Imaging: No results found.   Medications:     Scheduled Medications: . aspirin EC  81 mg Oral Daily  . carvedilol  12.5 mg Oral BID  . colchicine  0.6 mg Oral Daily  . enoxaparin (LOVENOX) injection  85 mg Subcutaneous Q24H  . furosemide  80 mg Intravenous BID  . isosorbide-hydrALAZINE  1 tablet Oral TID  . nicotine  21 mg Transdermal Daily  . sacubitril-valsartan  1 tablet Oral BID  .  sodium chloride flush  3 mL Intravenous Q12H  . sodium chloride flush  3 mL Intravenous Q12H  . spironolactone  50 mg Oral Daily    Infusions: . sodium chloride    . sodium chloride      PRN Medications: sodium chloride, sodium chloride, acetaminophen, acetaminophen, albuterol, dextromethorphan-guaiFENesin, hydrALAZINE, ondansetron (ZOFRAN) IV, sodium chloride flush, sodium chloride flush   Assessment/Plan:   1. Acute on chronic systolic CHF with prominent RV dysfunction: Echo in 2018 showed EF 35-40%, diffuse hypokinesis, D-shaped interventricular septum, moderately dysfunctional RV.  Suspect cor pulmonale.  Cause of RV failure uncertain, he does have significant OSA.  However, no evidence so far for OHS (oxygen saturation ok on room air during day so far).  - Volume status much improved weight down 30 pounds. He is 365 pounds today. Dry weight felt to be 350-358.  Renal function stable. - R/L cath yesterday. Normal cors. Elevated R-sided pressures with normal PCWP and CO. - Continue Entresto 97/103 bid, spironolactone 50 daily, Bidil 1 tab tid, Coreg 12.5 mg bid.  - Echo 05/30/19 with EF 40-45%. RV appears normal. There is septal flattening suggestive of RV pressure volume/overload. Personally reviewed - V/Q negative.  2. OSA/?OHS: Continue nightly CPAP.  3. 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  Currently in NSR.  4. HTN: BP controlled. 5. Morbid obesity - needs weight loss  Continue IV diuresis one more day. Likely can go home tommorow on torsemide 40 bid with close f/u in HF Clinic. D/w Dr. Judithe Modest of Stay: 3   Glori Bickers MD 06/02/2019, 2:21 PM  Advanced Heart Failure Team Pager [phone] (M-F; [place])  Please contact [facility] for night-coverage after hours (4p -7a ) and weekends on [email or web address]

## 2019-06-02 NOTE — Telephone Encounter (Signed)
Patient had visit 6/12

## 2019-06-03 ENCOUNTER — Telehealth: Payer: Self-pay | Admitting: Cardiovascular Disease

## 2019-06-03 ENCOUNTER — Encounter (HOSPITAL_COMMUNITY): Payer: Self-pay | Admitting: Nurse Practitioner

## 2019-06-03 LAB — POTASSIUM: Potassium: 4 mmol/L (ref 3.5–5.1)

## 2019-06-03 LAB — BASIC METABOLIC PANEL
Anion gap: 12 (ref 5–15)
BUN: 16 mg/dL (ref 6–20)
CO2: 32 mmol/L (ref 22–32)
Calcium: 9.1 mg/dL (ref 8.9–10.3)
Chloride: 93 mmol/L — ABNORMAL LOW (ref 98–111)
Creatinine, Ser: 1.22 mg/dL (ref 0.61–1.24)
GFR calc Af Amer: >60 mL/min (ref >60)
GFR calc non Af Amer: >60 mL/min (ref >60)
Glucose, Bld: 119 mg/dL — ABNORMAL HIGH (ref 70–99)
Potassium: 5.3 mmol/L — ABNORMAL HIGH (ref 3.5–5.1)
Sodium: 137 mmol/L (ref 135–145)

## 2019-06-03 MED ORDER — ENOXAPARIN SODIUM 80 MG/0.8ML ~~LOC~~ SOLN
80.0000 mg | SUBCUTANEOUS | Status: DC
  Administered 2019-06-04: 80 mg via SUBCUTANEOUS
  Filled 2019-06-03: qty 0.8

## 2019-06-03 NOTE — Progress Notes (Signed)
Advanced Heart Failure Rounding Note   Subjective:    Remains on IV lasix. Weight stable overnight. Feels good. No SOB. Eager to go home.   VQ negative for PE (Personally reviewed)   Echo with EF 40-45%. RV appears normal. There is septal flattening suggestive of RV pressure volume/overload. Personally reviewed   Objective:   Weight Range:  Vital Signs:   Temp:  [98.2 F (36.8 C)-98.6 F (37 C)] 98.5 F (36.9 C) (06/17 0546) Pulse Rate:  [70-81] 75 (06/17 0546) Resp:  [20] 20 (06/17 0546) BP: (91-117)/(60-79) 113/77 (06/17 0546) SpO2:  [91 %-99 %] 98 % (06/17 0546) Weight:  [165.9 kg] 165.9 kg (06/17 0546) Last BM Date: 05/29/19  Weight change: Filed Weights   06/01/19 0603 06/02/19 0600 06/03/19 0546  Weight: (!) 169.4 kg (!) 165.9 kg (!) 165.9 kg    Intake/Output:   Intake/Output Summary (Last 24 hours) at 06/03/2019 0840 Last data filed at 06/03/2019 0747 Gross per 24 hour  Intake 600 ml  Output 1775 ml  Net -1175 ml     Physical Exam: General:  Well appearing. No resp difficulty HEENT: normal Neck: supple. Hard to see JVP . Carotids 2+ bilat; no bruits. No lymphadenopathy or thryomegaly appreciated. Cor: PMI nondisplaced. Regular rate & rhythm. No rubs, gallops or murmurs. Lungs: clear Abdomen:  Obese soft, nontender, nondistended. No hepatosplenomegaly. No bruits or masses. Good bowel sounds. Extremities: no cyanosis, clubbing, rash, edema Neuro: alert & orientedx3, cranial nerves grossly intact. moves all 4 extremities w/o difficulty. Affect pleasant   Telemetry: Sinus 70-80s Personally reviewed   Labs: Basic Metabolic Panel: Recent Labs  Lab 05/29/19 1710 05/30/19 0520 05/31/19 0547 06/01/19 0523  06/01/19 1430 06/01/19 1436 06/01/19 1437 06/01/19 1448 06/01/19 1648 06/02/19 0526  NA 142 143 142 141   < > 144 143 143 147*  --  138  K 4.3 4.4 4.3 3.7   < > 3.3* 3.6 3.6 2.9*  --  3.9  CL 99 97* 93* 94*  --   --   --   --   --   --  94*   CO2 31 36* 38* 37*  --   --   --   --   --   --  32  GLUCOSE 135* 108* 100* 99  --   --   --   --   --   --  105*  BUN 21* 18 16 16   --   --   --   --   --   --  17  CREATININE 1.23 1.25* 1.25* 1.25*  --   --   --   --   --  1.04 1.16  CALCIUM 8.5* 8.7* 8.9 9.2  --   --   --   --   --   --  8.8*  MG 2.0  --   --  2.3  --   --   --   --   --   --   --    < > = values in this interval not displayed.    Liver Function Tests: No results for input(s): AST, ALT, ALKPHOS, BILITOT, PROT, ALBUMIN in the last 168 hours. No results for input(s): LIPASE, AMYLASE in the last 168 hours. No results for input(s): AMMONIA in the last 168 hours.  CBC: Recent Labs  Lab 05/29/19 1710  06/01/19 1430 06/01/19 1436 06/01/19 1437 06/01/19 1448 06/01/19 1648  WBC 6.8  --   --   --   --   --  4.4  HGB 15.7   < > 17.3* 17.7* 17.7* 16.3 16.7  HCT 52.0   < > 51.0 52.0 52.0 48.0 53.9*  MCV 86.4  --   --   --   --   --  83.2  PLT 290  --   --   --   --   --  290   < > = values in this interval not displayed.    Cardiac Enzymes: Recent Labs  Lab 05/29/19 1710  TROPONINI <0.03    BNP: BNP (last 3 results) Recent Labs    05/29/19 1710  BNP 499.5*    ProBNP (last 3 results) No results for input(s): PROBNP in the last 8760 hours.    Other results:  Imaging: No results found.   Medications:     Scheduled Medications: . aspirin EC  81 mg Oral Daily  . carvedilol  12.5 mg Oral BID  . colchicine  0.6 mg Oral Daily  . enoxaparin (LOVENOX) injection  85 mg Subcutaneous Q24H  . isosorbide-hydrALAZINE  1 tablet Oral TID  . nicotine  21 mg Transdermal Daily  . sacubitril-valsartan  1 tablet Oral BID  . sodium chloride flush  3 mL Intravenous Q12H  . sodium chloride flush  3 mL Intravenous Q12H  . spironolactone  50 mg Oral Daily  . torsemide  40 mg Oral BID    Infusions: . sodium chloride    . sodium chloride      PRN Medications: sodium chloride, sodium chloride, acetaminophen,  acetaminophen, albuterol, dextromethorphan-guaiFENesin, hydrALAZINE, ondansetron (ZOFRAN) IV, sodium chloride flush, sodium chloride flush   Assessment/Plan:   1. Acute on chronic systolic CHF with prominent RV dysfunction: Echo in 2018 showed EF 35-40%, diffuse hypokinesis, D-shaped interventricular septum, moderately dysfunctional RV.  Suspect cor pulmonale.  Cause of RV failure uncertain, he does have significant OSA.  However, no evidence so far for OHS (oxygen saturation ok on room air during day so far).  - Volume status much improved weight down 30 pounds. He is 365 pounds today. Dry weight felt to be 350-358.  Renal function stable. - R/L cath 6/15 . Normal cors. Elevated R-sided pressures with normal PCWP and CO. - Continue Entresto 97/103 bid, spironolactone 50 daily, Bidil 1 tab tid, Coreg 12.5 mg bid.  - Echo 05/30/19 with EF 40-45%. RV appears normal. There is septal flattening suggestive of RV pressure volume/overload. Personally reviewed - V/Q negative.  2. OSA/?OHS: Continue nightly CPAP.  3. 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  Currently in NSR.  4. HTN: BP controlled. 5. Morbid obesity - needs weight loss    Likely can go home tommorow on torsemide 40 bid (switch from lasix) with close f/u in HF Clinic. Will need to watch renal function closely on torsemide.    Home HF meds;  Entresto 97/103 bid spironolactone 50 daily Bidil 1 tab tid Coreg 12.5 mg bid.  Torsemide 40 bid (replaces lasix)   Length of Stay: 4   Daniel Bensimhon MD 06/03/2019, 8:40 AM  Advanced Heart Failure Team Pager [phone] (M-F; [place])  Please contact [facility] for night-coverage after hours (4p -7a ) and weekends on [email or web address]

## 2019-06-03 NOTE — Progress Notes (Signed)
PROGRESS NOTE  Roger Ward QRF:[id number] DOB: 06-03-1981 DOA: 05/29/2019 PCP: Veryl Speak, FNP  HPI/Recap of past 24 hours: Brief Narrative: 37 y.o.malewith medical history significant ofCHF with EF 35-40%,hypertension, diet-controlled diabetes, OSA on CPAP, morbid obesity, tobacco abuse, gout, who presents with shortness of breath on 05/29/19 recently gained about 15 pounds of weight and having shortness of breath for several days, was progressively getting worse. In ER patient found to have hypoxia with saturation of 87% on room air, BNP 499- troponin stable renal function negative COVID-19.  He was admitted for further management. Patient is being diuresed with IV Lasix.  Seen by cardiology underwent cardiac cath with EF 45% nonischemic cardiomyopathy. 6/16:Remains on diuresis.  06/03/19: Patient seen and examined at his bedside.  Denies chest pain or shortness of breath.  States bilateral lower extremities swelling is improving but not back to his baseline.  Started on torsemide 40 mg twice daily this morning by cardiology.  Will repeat renal function test tomorrow and closely monitor blood pressure.  Assessment/Plan: Principal Problem:   Acute combined systolic and diastolic congestive heart failure (HCC) Active Problems:   OSA (obstructive sleep apnea)   Tobacco abuse   Acute hypoxemic respiratory failure (HCC)   Chronic renal insufficiency, stage III (moderate) (HCC)   Diabetes mellitus type 2, noninsulin dependent (HCC)   Acute on chronic combined systolic (congestive) and diastolic (congestive) heart failure (HCC)   Morbid obesity with BMI of 50.0-59.9, adult (HCC)  Acute on chronic systolic CHF with prominent RV dysfunction:  Dietary education for low-sodium diet  Volume status significantly improved Cardiology adjusting medications Started on torsemide 40 mg twice daily today Monitor urine output Monitor renal function Monitor blood pressures Continue Entresto  97/103 twice daily, Aldactone 50 daily, BIDL 1 tab TID, Coreg 12.5 big.  Last 2D echo showed EF is 35 to 40% on echo with grade 1 diastolic dysfunction.  Anticipating discharge diuretics torsemide 40 twice daily. Heart failure team following      Filed Weights   05/31/19 0521 06/01/19 0603 06/02/19 0600  Weight: (!) 173.9 kg (!) 169.4 kg (!) 165.9 kg   Hyperkalemia Potassium 5.3 today On torsemide Continue to monitor Repeat BMP into the morning  OSA continue CPAP.  Satturatin well on room air.  Tobacco abuse: Continue current patch.  Discussed tobacco cessation.  Resolved acute hypoxemic respiratory failure: From CHF.  Resolved, currently on room air.  Chronic renal insufficiency, stage III: Renal function overall stable.  Monitor intermittently on diuretics. Last Labs           Recent Labs  Lab 05/29/19 1710 05/30/19 0520 05/31/19 0547 06/01/19 0523 06/01/19 1648 06/02/19 0526  BUN 21* 18 16 16   --  17  CREATININE 1.23 1.25* 1.25* 1.25* 1.04 1.16      Diabetes mellitus type 2, noninsulin dependent, hba1c 6.5 last in 2016.  Diet controlled.  Blood sugar is stable.  Morbid obesity with BMI of 50.0-59.9, adult : Again discussed about weight loss, healthy lifestyle.  DVT prophylaxis:lovenox Code Status: Full code Family Communication: Plan of care discussed extensively with the patient  Disposition Plan:  Possible discharge tomorrow or when Cardiology signs off.   Consultants:  Cardiology   procedures: Cardiac cath: NICM 45% LVEF  Antimicrobials:    Anti-infectives (From admission, onward)   None       Objective: Vitals:   06/02/19 1922 06/02/19 2158 06/03/19 0546 06/03/19 1120  BP: 91/60 117/77 113/77 130/84  Pulse: 70 81 75 68  Resp: 20  20 17   Temp: 98.6 F (37 C)  98.5 F (36.9 C) 98.8 F (37.1 C)  TempSrc:    Oral  SpO2: 91%  98% 95%  Weight:   (!) 165.9 kg   Height:        Intake/Output Summary (Last 24 hours) at 06/03/2019  1216 Last data filed at 06/03/2019 1121 Gross per 24 hour  Intake 363 ml  Output 1200 ml  Net -837 ml   Filed Weights   06/01/19 0603 06/02/19 0600 06/03/19 0546  Weight: (!) 169.4 kg (!) 165.9 kg (!) 165.9 kg    Exam:  . General: 38 y.o. year-old male obese in no acute distress.  Alert and oriented x3. . Cardiovascular: Regular rate and rhythm with no rubs or gallops.  No thyromegaly or JVD noted.   Marland Kitchen Respiratory: Clear to auscultation with no wheezes or rales. Good inspiratory effort. . Abdomen: Soft nontender nondistended with normal bowel sounds x4 quadrants. . Musculoskeletal: 1+ pitting edema in lower extremity bilaterally. Marland Kitchen Psychiatry: Mood is appropriate for condition and setting   Data Reviewed: CBC: Recent Labs  Lab 05/29/19 1710  06/01/19 1430 06/01/19 1436 06/01/19 1437 06/01/19 1448 06/01/19 1648  WBC 6.8  --   --   --   --   --  4.4  HGB 15.7   < > 17.3* 17.7* 17.7* 16.3 16.7  HCT 52.0   < > 51.0 52.0 52.0 48.0 53.9*  MCV 86.4  --   --   --   --   --  83.2  PLT 290  --   --   --   --   --  290   < > = values in this interval not displayed.   Basic Metabolic Panel: Recent Labs  Lab 05/29/19 1710 05/30/19 0520 05/31/19 0547 06/01/19 0523  06/01/19 1436 06/01/19 1437 06/01/19 1448 06/01/19 1648 06/02/19 0526 06/03/19 0824  NA 142 143 142 141   < > 143 143 147*  --  138 137  K 4.3 4.4 4.3 3.7   < > 3.6 3.6 2.9*  --  3.9 5.3*  CL 99 97* 93* 94*  --   --   --   --   --  94* 93*  CO2 31 36* 38* 37*  --   --   --   --   --  32 32  GLUCOSE 135* 108* 100* 99  --   --   --   --   --  105* 119*  BUN 21* 18 16 16   --   --   --   --   --  17 16  CREATININE 1.23 1.25* 1.25* 1.25*  --   --   --   --  1.04 1.16 1.22  CALCIUM 8.5* 8.7* 8.9 9.2  --   --   --   --   --  8.8* 9.1  MG 2.0  --   --  2.3  --   --   --   --   --   --   --    < > = values in this interval not displayed.   GFR: Estimated Creatinine Clearance: 134 mL/min (by C-G formula based on SCr  of 1.22 mg/dL). Liver Function Tests: No results for input(s): AST, ALT, ALKPHOS, BILITOT, PROT, ALBUMIN in the last 168 hours. No results for input(s): LIPASE, AMYLASE in the last 168 hours. No results for input(s): AMMONIA in the last 168 hours.  Coagulation Profile: No results for input(s): INR, PROTIME in the last 168 hours. Cardiac Enzymes: Recent Labs  Lab 05/29/19 1710  TROPONINI <0.03   BNP (last 3 results) No results for input(s): PROBNP in the last 8760 hours. HbA1C: No results for input(s): HGBA1C in the last 72 hours. CBG: No results for input(s): GLUCAP in the last 168 hours. Lipid Profile: No results for input(s): CHOL, HDL, LDLCALC, TRIG, CHOLHDL, LDLDIRECT in the last 72 hours. Thyroid Function Tests: No results for input(s): TSH, T4TOTAL, FREET4, T3FREE, THYROIDAB in the last 72 hours. Anemia Panel: No results for input(s): VITAMINB12, FOLATE, FERRITIN, TIBC, IRON, RETICCTPCT in the last 72 hours. Urine analysis:    Component Value Date/Time   COLORURINE YELLOW 03/05/2015 1155   APPEARANCEUR CLEAR 03/05/2015 1155   LABSPEC 1.015 03/17/2018 1514   PHURINE 5.5 03/17/2018 1514   GLUCOSEU NEGATIVE 03/17/2018 1514   HGBUR NEGATIVE 03/17/2018 1514   BILIRUBINUR NEGATIVE 03/17/2018 1514   KETONESUR NEGATIVE 03/17/2018 1514   PROTEINUR 100 (A) 03/17/2018 1514   UROBILINOGEN 0.2 03/17/2018 1514   NITRITE NEGATIVE 03/17/2018 1514   LEUKOCYTESUR NEGATIVE 03/17/2018 1514   Sepsis Labs: @LABRCNTIP (procalcitonin:4,lacticidven:4)  ) Recent Results (from the past 240 hour(s))  SARS Coronavirus 2 (CEPHEID- Performed in [facility]), Hosp Order     Status: None   Collection Time: 05/29/19  5:23 PM   Specimen: Nasopharyngeal Swab  Result Value Ref Range Status   SARS Coronavirus 2 NEGATIVE NEGATIVE Final    Comment: (NOTE) If result is NEGATIVE SARS-CoV-2 target nucleic acids are NOT DETECTED. The SARS-CoV-2 RNA is generally detectable in upper and  lower  respiratory specimens during the acute phase of infection. The lowest  concentration of SARS-CoV-2 viral copies this assay can detect is 250  copies / mL. A negative result does not preclude SARS-CoV-2 infection  and should not be used as the sole basis for treatment or other  patient management decisions.  A negative result may occur with  improper specimen collection / handling, submission of specimen other  than nasopharyngeal swab, presence of viral mutation(s) within the  areas targeted by this assay, and inadequate number of viral copies  (<250 copies / mL). A negative result must be combined with clinical  observations, patient history, and epidemiological information. If result is POSITIVE SARS-CoV-2 target nucleic acids are DETECTED. The SARS-CoV-2 RNA is generally detectable in upper and lower  respiratory specimens dur ing the acute phase of infection.  Positive  results are indicative of active infection with SARS-CoV-2.  Clinical  correlation with patient history and other diagnostic information is  necessary to determine patient infection status.  Positive results do  not rule out bacterial infection or co-infection with other viruses. If result is PRESUMPTIVE POSTIVE SARS-CoV-2 nucleic acids MAY BE PRESENT.   A presumptive positive result was obtained on the submitted specimen  and confirmed on repeat testing.  While 2019 novel coronavirus  (SARS-CoV-2) nucleic acids may be present in the submitted sample  additional confirmatory testing may be necessary for epidemiological  and / or clinical management purposes  to differentiate between  SARS-CoV-2 and other Sarbecovirus currently known to infect humans.  If clinically indicated additional testing with an alternate test  methodology [phone] is advised. The SARS-CoV-2 RNA is generally  detectable in upper and lower respiratory sp ecimens during the acute  phase of infection. The expected result is Negative.  Fact Sheet for Patients:  BoilerBrush.com.cyhttps://www.fda.gov/media/136312/download Fact Sheet for Healthcare Providers: https://pope.com/https://www.fda.gov/media/136313/download This test is  not yet approved or cleared by the [place] FDA and has been authorized for detection and/or diagnosis of SARS-CoV-2 by FDA under an Emergency Use Authorization (EUA).  This EUA will remain in effect (meaning this test can be used) for the duration of the COVID-19 declaration under Section 564(b)(1) of the Act, 21 U.S.C. section 360bbb-3(b)(1), unless the authorization is terminated or revoked sooner. Performed at [facility], [address] [place], [place] [address]       Studies: No results found.  Scheduled Meds: . aspirin EC  81 mg Oral Daily  . carvedilol  12.5 mg Oral BID  . colchicine  0.6 mg Oral Daily  . [START ON 06/04/2019] enoxaparin (LOVENOX) injection  80 mg Subcutaneous Q24H  . isosorbide-hydrALAZINE  1 tablet Oral TID  . nicotine  21 mg Transdermal Daily  . sacubitril-valsartan  1 tablet Oral BID  . sodium chloride flush  3 mL Intravenous Q12H  . sodium chloride flush  3 mL Intravenous Q12H  . spironolactone  50 mg Oral Daily  . torsemide  40 mg Oral BID    Continuous Infusions: . sodium chloride    . sodium chloride       LOS: 4 days     Darlin Droparole N Hall, MD [facility] Pager [phone]  If 7PM-7AM, please contact night-coverage [email or web address] Password [email or web address] 06/03/2019, 12:16 PM

## 2019-06-03 NOTE — Telephone Encounter (Signed)
06/02/2019 Patient wife brought an [employer] Form in signed release because patient was in hospital, she also brought a 25.00 money order.  I inter-office it to [place] on 06/03/2019. cbr

## 2019-06-04 ENCOUNTER — Encounter (HOSPITAL_COMMUNITY): Payer: Self-pay | Admitting: Physician Assistant

## 2019-06-04 MED ORDER — TORSEMIDE 20 MG PO TABS: 40.0000 mg | ORAL_TABLET | Freq: Two times a day (BID) | ORAL | 0 refills | Status: DC

## 2019-06-04 MED ORDER — CARVEDILOL 12.5 MG PO TABS: 12.5000 mg | ORAL_TABLET | Freq: Two times a day (BID) | ORAL | 0 refills | Status: DC

## 2019-06-04 MED ORDER — ENTRESTO 97-103 MG PO TABS: 1.0000 | ORAL_TABLET | Freq: Two times a day (BID) | ORAL | 0 refills | Status: DC

## 2019-06-04 MED ORDER — ASPIRIN 81 MG PO TBEC: 81.0000 mg | DELAYED_RELEASE_TABLET | Freq: Every day | ORAL | 0 refills | Status: DC

## 2019-06-04 MED ORDER — SPIRONOLACTONE 50 MG PO TABS: 50.0000 mg | ORAL_TABLET | Freq: Every day | ORAL | 0 refills | Status: DC

## 2019-06-04 MED ORDER — BIDIL 20-37.5 MG PO TABS: 1.0000 | ORAL_TABLET | Freq: Three times a day (TID) | ORAL | 0 refills | Status: DC

## 2019-06-04 MED ORDER — NICOTINE 21 MG/24HR TD PT24: 21.0000 mg | MEDICATED_PATCH | Freq: Every day | TRANSDERMAL | 0 refills | Status: DC

## 2019-06-04 NOTE — Progress Notes (Signed)
PT Cancellation Note  Patient Details Name: Roger Ward MRN: [id number] DOB: Sep 14, 1981   Cancelled Treatment:    Reason Eval/Treat Not Completed: Other (comment).  Pt was in BR on two attempts and states he is working on getting cleaned up.  Will try as time and pt allow to see him.   Ramond Dial 06/04/2019, 11:26 AM   Mee Hives, PT MS Acute Rehab Dept. Number: [place] and [place]

## 2019-06-04 NOTE — Progress Notes (Signed)
Pt given oral and written discharge instructions. Was provided back to work slip from MD. Awaiting spouse for discharge from facility.

## 2019-06-04 NOTE — Discharge Instructions (Signed)
Heart Failure  Heart failure means your heart has trouble pumping blood. This makes it hard for your body to work well. Heart failure is usually a long-term (chronic) condition. You must take good care of yourself and follow your treatment plan from your doctor. Follow these instructions at home: Medicines  Take over-the-counter and prescription medicines only as told by your doctor. ? Do not stop taking your medicine unless your doctor told you to do that. ? Do not skip any doses. ? Refill your prescriptions before you run out of medicine. You need your medicines every day. Eating and drinking   Eat heart-healthy foods. Talk with a diet and nutrition specialist (dietitian) to make an eating plan.  Choose foods that: ? Have no trans fat. ? Are low in saturated fat and cholesterol.  Choose healthy foods, like: ? Fresh or frozen fruits and vegetables. ? Fish. ? Low-fat (lean) meats. ? Legumes (like beans, peas, and lentils). ? Fat-free or low-fat dairy products. ? Whole-grain foods. ? High-fiber foods.  Limit salt (sodium) if told by your doctor. Ask your nutrition specialist to recommend heart-healthy seasonings.  Cook in healthy ways instead of frying. Healthy ways of cooking include: ? Roasting. ? Grilling. ? Broiling. ? Baking. ? Poaching. ? Steaming. ? Stir-frying.  Limit how much fluid you drink, if told by your doctor. Lifestyle  Do not smoke or use chewing tobacco. Do not use nicotine gum or patches before talking to your doctor.  Limit alcohol intake to no more than 1 drink a day for non-pregnant women and 2 drinks a day for men. One drink equals 12 oz of beer, 5 oz of wine, or 1 oz of hard liquor. ? Tell your doctor if you drink alcohol many times a week. ? Talk with your doctor about whether any alcohol is safe for you. ? You should stop drinking alcohol: ? If your heart has been damaged by alcohol. ? You have very bad heart failure.  Do not use illegal  drugs.  Lose weight if told by your doctor.  Do moderate physical activity if told by your doctor. Ask your doctor what activities are safe for you if: ? You are of older age (elderly). ? You have very bad heart failure. Keep track of important information  Weigh yourself every day. ? Weigh yourself every morning after you pee (urinate) and before breakfast. ? Wear the same amount of clothing each time. ? Write down your daily weight. Give your record to your doctor.  Check and write down your blood pressure as told by your doctor.  Check your pulse as told by your doctor. Dealing with heat and cold  If the weather is very hot: ? Avoid activity that takes a lot of energy. ? Use air conditioning or fans, or find a cooler place. ? Avoid caffeine. ? Avoid alcohol. ? Wear clothing that is loose-fitting, lightweight, and light-colored.  If the weather is very cold: ? Avoid activity that takes a lot of energy. ? Layer your clothes. ? Wear mittens or gloves, a hat, and a scarf when you go outside. ? Avoid alcohol. General instructions  Manage other conditions that you have as told by your doctor.  Learn to manage stress. If you need help, ask your doctor.  Plan rest periods for when you get tired.  Get education and support as needed.  Get rehab (rehabilitation) to help you stay independent and to help with everyday tasks.  Stay up to date with shots (  immunizations), especially pneumococcal and flu (influenza) shots.  Keep all follow-up visits as told by your doctor. This is important. Contact a doctor if:  You gain weight quickly.  You are more short of breath than normal.  You cannot do your normal activities.  You tire easily.  You cough more than normal, especially with activity.  You have any or more puffiness (swelling) in areas such as your hands, feet, ankles, or belly (abdomen).  You cannot sleep because it is hard to breathe.  You feel like your heart  is beating fast (palpitations).  You get dizzy or light-headed when you stand up. Get help right away if:  You have trouble breathing.  You or someone else notices a change in your awareness. This could be trouble staying awake or trouble concentrating.  You have chest pain or discomfort.  You pass out (faint). Summary  Heart failure means your heart has trouble pumping blood.  Make sure you refill your prescriptions before you run out of medicine. You need your medicines every day.  Keep records of your weight and blood pressure to give to your doctor.  Contact a doctor if you gain weight quickly. This information is not intended to replace advice given to you by your health care provider. Make sure you discuss any questions you have with your health care provider. Document Released: 09/11/2008 Document Revised: 08/27/2018 Document Reviewed: 12/25/2016 Elsevier Interactive Patient Education  2019 [employer]   Edema  Edema is when you have too much fluid in your body or under your skin. Edema may make your legs, feet, and ankles swell up. Swelling is also common in looser tissues, like around your eyes. This is a common condition. It gets more common as you get older. There are many possible causes of edema. Eating too much salt (sodium) and being on your feet or sitting for a long time can cause edema in your legs, feet, and ankles. Hot weather may make edema worse. Edema is usually painless. Your skin may look swollen or shiny. Follow these instructions at home:  Keep the swollen body part raised (elevated) above the level of your heart when you are sitting or lying down.  Do not sit still or stand for a long time.  Do not wear tight clothes. Do not wear garters on your upper legs.  Exercise your legs. This can help the swelling go down.  Wear elastic bandages or support stockings as told by your doctor.  Eat a low-salt (low-sodium) diet to reduce fluid as told by your  doctor.  Depending on the cause of your swelling, you may need to limit how much fluid you drink (fluid restriction).  Take over-the-counter and prescription medicines only as told by your doctor. Contact a doctor if:  Treatment is not working.  You have heart, liver, or kidney disease and have symptoms of edema.  You have sudden and unexplained weight gain. Get help right away if:  You have shortness of breath or chest pain.  You cannot breathe when you lie down.  You have pain, redness, or warmth in the swollen areas.  You have heart, liver, or kidney disease and get edema all of a sudden.  You have a fever and your symptoms get worse all of a sudden. Summary  Edema is when you have too much fluid in your body or under your skin.  Edema may make your legs, feet, and ankles swell up. Swelling is also common in looser tissues, like  around your eyes.  Raise (elevate) the swollen body part above the level of your heart when you are sitting or lying down.  Follow your doctor's instructions about diet and how much fluid you can drink (fluid restriction). This information is not intended to replace advice given to you by your health care provider. Make sure you discuss any questions you have with your health care provider. Document Released: 05/21/2008 Document Revised: 12/21/2016 Document Reviewed: 12/21/2016 Elsevier Interactive Patient Education  2019 [employer]   Heart Failure Action Plan A heart failure action plan helps you understand what to do when you have symptoms of heart failure. Follow the plan that was created by you and your health care provider. Review your plan each time you visit your health care provider. Red zone These signs and symptoms mean you should get medical help right away:  You have trouble breathing when resting.  You have a dry cough that is getting worse.  You have swelling or pain in your legs or abdomen that is getting worse.  You  suddenly gain more than 2-3 lb (0.9-1.4 kg) in a day, or more than 5 lb (2.3 kg) in one week. This amount may be more or less depending on your condition.  You have trouble staying awake or you feel confused.  You have chest pain.  You do not have an appetite.  You pass out. If you experience any of these symptoms:  Call your local emergency services (911 in the [place] right away or seek help at the emergency department of the nearest hospital. Yellow zone These signs and symptoms mean your condition may be getting worse and you should make some changes:  You have trouble breathing when you are active or you need to sleep with extra pillows.  You have swelling in your legs or abdomen.  You gain 2-3 lb (0.9-1.4 kg) in one day, or 5 lb (2.3 kg) in one week. This amount may be more or less depending on your condition.  You get tired easily.  You have trouble sleeping.  You have a dry cough. If you experience any of these symptoms:  Contact your health care provider within the next day.  Your health care provider may adjust your medicines. Green zone These signs mean you are doing well and can continue what you are doing:  You do not have shortness of breath.  You have very little swelling or no new swelling.  Your weight is stable (no gain or loss).  You have a normal activity level.  You do not have chest pain or any other new symptoms. Follow these instructions at home:  Take over-the-counter and prescription medicines only as told by your health care provider.  Weigh yourself daily. Your target weight is __________ lb (__________ kg). ? Call your health care provider if you gain more than __________ lb (__________ kg) in a day, or more than __________ lb (__________ kg) in one week.  Eat a heart-healthy diet. Work with a diet and nutrition specialist (dietitian) to create an eating plan that is best for you.  Keep all follow-up visits as told by your health care  provider. This is important. Where to find more information  American Heart Association: www.heart.org Summary  Follow the action plan that was created by you and your health care provider.  Get help right away if you have any symptoms in the Red zone. This information is not intended to replace advice given to you by your health  care provider. Make sure you discuss any questions you have with your health care provider. Document Released: 01/12/2017 Document Revised: 08/07/2017 Document Reviewed: 01/12/2017 Elsevier Interactive Patient Education  2019 [employer]   Heart Failure Eating Plan Heart failure, also called congestive heart failure, occurs when your heart does not pump blood well enough to meet your body's needs for oxygen-rich blood. Heart failure is a long-term (chronic) condition. Living with heart failure can be challenging. However, following your health care provider's instructions about a healthy lifestyle and working with a diet and nutrition specialist (dietitian) to choose the right foods may help to improve your symptoms. What are tips for following this plan? General guidelines  Do not eat more than 2,300 mg of salt (sodium) a day. The amount of sodium that is recommended for you may be lower, depending on your condition.  Maintain a healthy body weight as directed. Ask your health care provider what a healthy weight is for you. ? Check your weight every day. ? Work with your health care provider and dietitian to make a plan that is right for you to lose weight or maintain your current weight.  Limit how much fluid you drink. Ask your health care provider or dietitian how much fluid you can have each day.  Limit or avoid alcohol as told by your health care provider or dietitian. Reading food labels  Check food labels for the amount of sodium per serving. Choose foods that have less than 140 mg (milligrams) of sodium in each serving.  Check food labels for the  number of calories per serving. This is important if you need to limit your daily calorie intake to lose weight.  Check food labels for the serving size. If you eat more than one serving, you will be eating more sodium and calories than what is listed on the label.  Look for foods that are labeled as "sodium-free," "very low sodium," or "low sodium." ? Foods labeled as "reduced sodium" or "lightly salted" may still have more sodium than what is recommended for you. Cooking  Avoid adding salt when cooking. Ask your health care provider or dietitian before using salt substitutes.  Season food with salt-free seasonings, spices, or herbs. Check the label of seasoning mixes to make sure they do not contain salt.  Cook with heart-healthy oils, such as olive, canola, soybean, or sunflower oil.  Do not fry foods. Cook foods using low-fat methods, such as baking, boiling, grilling, and broiling.  Limit unhealthy fats when cooking by: ? Removing the skin from poultry, such as chicken. ? Removing all visible fats from meats. ? Skimming the fat off from stews, soups, and gravies before serving them. Meal planning   Limit your intake of: ? Processed, canned, or pre-packaged foods. ? Foods that are high in trans fat, such as fried foods. ? Sweets, desserts, sugary drinks, and other foods with added sugar. ? Full-fat dairy products, such as whole milk.  Eat a balanced diet that includes: ? 4-5 servings of fruit each day and 4-5 servings of vegetables each day. At each meal, try to fill half of your plate with fruits and vegetables. ? Up to 6-8 servings of whole grains each day. ? Up to 2 servings of lean meat, poultry, or fish each day. One serving of meat is equal to 3 oz. This is about the same size as a deck of cards. ? 2 servings of low-fat dairy each day. ? Heart-healthy fats. Healthy fats called omega-3 fatty  acids are found in foods such as flaxseed and cold-water fish like sardines,  salmon, and mackerel.  Aim to eat 25-35 g (grams) of fiber a day. Foods that are high in fiber include apples, broccoli, carrots, beans, peas, and whole grains.  Do not add salt or condiments that contain salt (such as soy sauce) to foods before eating.  When eating at a restaurant, ask that your food be prepared with less salt or no salt, if possible.  Try to eat 2 or more vegetarian meals each week.  Eat more home-cooked food and eat less restaurant, buffet, and fast food. Recommended foods The items listed may not be a complete list. Talk with your dietitian about what dietary choices are best for you. Grains Bread with less than 80 mg of sodium per slice. Whole-wheat pasta, quinoa, and brown rice. Oats and oatmeal. Barley. Millet. Grits and cream of wheat. Whole-grain and whole-wheat cold cereal. Vegetables All fresh vegetables. Vegetables that are frozen without sauce or added salt. Low-sodium or sodium-free canned vegetables. Fruits All fresh, frozen, and canned fruits. Dried fruits, such as raisins, prunes, and cranberries. Meats and other protein foods Lean cuts of meat. Skinless chicken and [place]. Fish with high omega-3 fatty acids, such as salmon, sardines, and other cold-water fishes. Eggs. Dried beans, peas, and edamame. Unsalted nuts and nut butters. Dairy Low-fat or nonfat (skim) milk and dried milk. Rice milk, soy milk, and almond milk. Low-fat or nonfat yogurt. Small amounts of reduced-sodium block cheese. Low-sodium cottage cheese. Fats and oils Olive, canola, soybean, flaxseed, or sunflower oil. Avocado. Sweets and desserts Apple sauce. Granola bars. Sugar-free pudding and gelatin. Frozen fruit bars. Seasoning and other foods Fresh and dried herbs. Lemon or lime juice. Vinegar. Low-sodium ketchup. Salt-free marinades, salad dressings, sauces, and seasonings. Foods to avoid The items listed may not be a complete list. Talk with your dietitian about what dietary choices  are best for you. Grains Bread with more than 80 mg of sodium per slice. Hot or cold cereal with more than 140 mg sodium per serving. Salted pretzels and crackers. Pre-packaged breadcrumbs. Bagels, croissants, and biscuits. Vegetables Canned vegetables. Frozen vegetables with sauce or seasonings. Creamed vegetables. [place] fries. Onion rings. Pickled vegetables and sauerkraut. Fruits Fruits that are dried with sodium-containing preservatives. Meats and other protein foods Ribs and chicken wings. Bacon, ham, pepperoni, bologna, salami, and packaged luncheon meats. Hot dogs, bratwurst, and sausage. Canned meat. Smoked meat and fish. Salted nuts and seeds. Dairy Whole milk, half-and-half, and cream. Buttermilk. Processed cheese, cheese spreads, and cheese curds. Regular cottage cheese. Feta cheese. Shredded cheese. String cheese. Fats and oils Butter, lard, shortening, ghee, and bacon fat. Canned and packaged gravies. Seasoning and other foods Onion salt, garlic salt, table salt, and sea salt. Marinades. Regular salad dressings. Relishes, pickles, and olives. Meat flavorings and tenderizers, and bouillon cubes. Horseradish, ketchup, and mustard. Worcestershire sauce. Teriyaki sauce, soy sauce (including reduced sodium). Hot sauce and Tabasco sauce. Steak sauce, fish sauce, oyster sauce, and cocktail sauce. Taco seasonings. Barbecue sauce. Tartar sauce. Summary  A heart failure eating plan includes changes that limit your intake of sodium and unhealthy fat, and it may help you lose weight or maintain a healthy weight. Your health care provider may also recommend limiting how much fluid you drink.  Most people with heart failure should eat no more than 2,300 mg of salt (sodium) a day. The amount of sodium that is recommended for you may be lower, depending on your condition.  Contact your health care provider or dietitian before making any major changes to your diet. This information is not intended  to replace advice given to you by your health care provider. Make sure you discuss any questions you have with your health care provider. Document Released: 04/19/2017 Document Revised: 04/19/2017 Document Reviewed: 04/19/2017 Elsevier Interactive Patient Education  2019 [employer] Inc.   Living With Heart Failure  Heart failure is a long-term (chronic) condition in which the heart cannot pump enough blood through the body. When this happens, parts of the body do not get the blood and oxygen they need. There is no cure for heart failure at this time, so it is important for you to take good care of yourself and follow the treatment plan set by your health care provider. If you are living with heart failure, there are ways to help you manage the disease. Follow these instructions at home: Living with heart failure requires you to make changes in your life. Your health care team will teach you about the changes you need to make in order to relieve your symptoms and lower your risk of going to the hospital. Follow the treatment plan as set by your health care provider. Medicines Medicines are important in reducing your heart's workload, slowing the progression of heart failure, and improving your symptoms.  Take over-the-counter and prescription medicines only as told by your health care provider.  Do not stop taking your medicine unless your health care provider tells you to do that.  Do not skip any dose of your medicine.  Refill prescriptions before you run out of medicine. You need your medicines every day. Eating and drinking   Eat heart-healthy foods. Talk with a dietitian to make an eating plan that is right for you. ? If directed by your health care provider: ? Limit salt (sodium). Lowering your sodium intake may reduce symptoms of heart failure. Ask a dietitian to recommend heart-healthy seasonings. ? Limit your fluid intake. Fluid restriction may reduce symptoms of heart  failure. ? Use low-fat cooking methods instead of frying. Low-fat methods include roasting, grilling, broiling, baking, poaching, steaming, and stir-frying. ? Choose foods that contain no trans fat and are low in saturated fat and cholesterol. Healthy choices include fresh or frozen fruits and vegetables, fish, lean meats, legumes, fat-free or low-fat dairy products, and whole-grain or high-fiber foods.  Limit alcohol intake to no more than 1 drink a day for nonpregnant women and 2 drinks a day for men. One drink equals 12 oz of beer, 5 oz of wine, or 1 oz of hard liquor. ? Drinking more than that is harmful to your heart. Tell your health care provider if you drink alcohol several times a week. ? Talk with your health care provider about whether any level of alcohol use is safe for you. Activity   Ask your health care provider about attending cardiac rehabilitation. These programs include aerobic physical activity, which provides many benefits for your heart.  If no cardiac rehabilitation program is available, ask your health care provider what aerobic exercises are safe for you to do. Lifestyle Make the lifestyle changes recommended by your health care provider. In general:  Lose weight if your health care provider tells you to do that. Weight loss may reduce symptoms of heart failure.  Do not use any products that contain nicotine or tobacco, such as cigarettes or e-cigarettes. If you need help quitting, ask your health care provider.  Do not use street (illegal) drugs.  Return to your normal activities as told by your health care provider. Ask your health care provider what activities are safe for you. General instructions   Make sure you weigh yourself every day to track your weight. Rapid weight gain may indicate an increase in fluid in your body and may increase the workload of your heart. ? Weigh yourself every morning. Do this after you urinate but before you eat  breakfast. ? Wear the same type of clothing, without shoes, each time you weigh yourself. ? Weigh yourself on the same scale and in the same spot each time.  Living with chronic heart failure often leads to emotions such as fear, stress, anxiety, and depression. If you feel any of these emotions and need help coping, contact your health care provider. Other ways to get help include: ? Talking to friends and family members about your condition. They can give you support and guidance. Explain your symptoms to them and, if comfortable, invite them to attend appointments or rehabilitation with you. ? Joining a support group for people with chronic heart failure. Talking with other people who have the same symptoms may give you new ways of coping with your disease and your emotions.  Stay up to date with your shots (vaccines). Staying current on pneumococcal and influenza vaccines is especially important in preventing germs from attacking your airways (respiratory infections).  Keep all follow-up visits as told by your health care provider. This is important. How to recognize changes in your condition You and your family members need to know what changes to watch for in your condition. Watch for the following changes and report them to your health care provider:  Sudden weight gain. Ask your health care provider what amount of weight gain to report.  Shortness of breath: ? Feeling short of breath while at rest, with no exercise or activity that required great effort. ? Feeling breathless with activity.  Swelling of your lower legs or ankles.  Difficulty sleeping: ? You wake up feeling short of breath. ? You have to use more pillows to raise your head in order to sleep.  Frequent, dry, hacking cough.  Loss of appetite.  Feeling more tired all the time.  Depression or feelings of sadness or hopelessness.  Bloating in the stomach. Where to find more information  Local support groups.  Ask your health care provider about groups near you.  The American Heart Association: www.heart.org Contact a health care provider if:  You have a rapid weight gain.  You have increasing shortness of breath that is unusual for you.  You are unable to participate in your usual physical activities.  You tire easily.  You cough more than normal, especially with physical activity.  You have any swelling or more swelling in areas such as your hands, feet, ankles, or abdomen.  You feel like your heart is beating quickly (palpitations).  You become dizzy or light-headed when you stand up. Get help right away if:  You have difficulty breathing.  You notice or your family notices a change in your awareness, such as having trouble staying awake or having difficulty with concentration.  You have pain or discomfort in your chest.  You have an episode of fainting (syncope). Summary  There is no cure for heart failure, so it is important for you to take good care of yourself and follow the treatment plan set by your health care provider.  Medicines are important in reducing your heart's workload, slowing the progression of  heart failure, and improving your symptoms.  Living with chronic heart failure often leads to emotions such as fear, stress, anxiety, and depression. If you are feeling any of these emotions and need help coping, contact your health care provider. This information is not intended to replace advice given to you by your health care provider. Make sure you discuss any questions you have with your health care provider. Document Released: 04/17/2017 Document Revised: 04/17/2017 Document Reviewed: 04/17/2017 Elsevier Interactive Patient Education  2019 [employer] Inc.   Peripheral Edema  Peripheral edema is swelling that is caused by a buildup of fluid. Peripheral edema most often affects the lower legs, ankles, and feet. It can also develop in the arms, hands, and face. The  area of the body that has peripheral edema will look swollen. It may also feel heavy or warm. Your clothes may start to feel tight. Pressing on the area may make a temporary dent in your skin. You may not be able to move your arm or leg as much as usual. There are many causes of peripheral edema. It can be a complication of other diseases, such as congestive heart failure, kidney disease, or a problem with your blood circulation. It also can be a side effect of certain medicines. It often happens to women during pregnancy. Sometimes, the cause is not known. Treating the underlying condition is often the only treatment for peripheral edema. Follow these instructions at home: Pay attention to any changes in your symptoms. Take these actions to help with your discomfort:  Raise (elevate) your legs while you are sitting or lying down.  Move around often to prevent stiffness and to lessen swelling. Do not sit or stand for long periods of time.  Wear support stockings as told by your health care provider.  Follow instructions from your health care provider about limiting salt (sodium) in your diet. Sometimes eating less salt can reduce swelling.  Take over-the-counter and prescription medicines only as told by your health care provider. Your health care provider may prescribe medicine to help your body get rid of excess water (diuretic).  Keep all follow-up visits as told by your health care provider. This is important. Contact a health care provider if:  You have a fever.  Your edema starts suddenly or is getting worse, especially if you are pregnant or have a medical condition.  You have swelling in only one leg.  You have increased swelling and pain in your legs. Get help right away if:  You develop shortness of breath, especially when you are lying down.  You have pain in your chest or abdomen.  You feel weak.  You faint. This information is not intended to replace advice given to you  by your health care provider. Make sure you discuss any questions you have with your health care provider. Document Released: 01/10/2005 Document Revised: 05/07/2016 Document Reviewed: 06/15/2015 Elsevier Interactive Patient Education  2019 [employer].

## 2019-06-04 NOTE — Plan of Care (Signed)
  Problem: Health Behavior/Discharge Planning: Goal: Ability to manage health-related needs will improve Outcome: Progressing   Problem: Clinical Measurements: Goal: Ability to maintain clinical measurements within normal limits will improve 06/04/2019 0035 by Rexford Maus, RN Outcome: Progressing 06/04/2019 0035 by Rexford Maus, RN Outcome: Progressing Goal: Will remain free from infection 06/04/2019 0035 by Rexford Maus, RN Outcome: Progressing 06/04/2019 0035 by Rexford Maus, RN Outcome: Progressing Goal: Diagnostic test results will improve Outcome: Progressing Goal: Respiratory complications will improve Outcome: Progressing   Problem: Activity: Goal: Risk for activity intolerance will decrease Outcome: Progressing   Problem: Nutrition: Goal: Adequate nutrition will be maintained Outcome: Progressing   Problem: Elimination: Goal: Will not experience complications related to bowel motility Outcome: Progressing

## 2019-06-04 NOTE — Plan of Care (Signed)
  Problem: Health Behavior/Discharge Planning: Goal: Ability to manage health-related needs will improve Outcome: Progressing   

## 2019-06-04 NOTE — Discharge Summary (Signed)
Discharge Summary  Roger Ward [id number] DOB: 08/23/1981  PCP: Veryl Speakalone, Gregory D, FNP  Admit date: 05/29/2019 Discharge date: 06/04/2019  Time spent: 35 minutes  Recommendations for Outpatient Follow-up:  1. Follow-up with cardiology 2. Follow-up with your primary care provider 3. Take your medications as prescribed  Discharge Diagnoses:  Active Hospital Problems   Diagnosis Date Noted  . Acute combined systolic and diastolic congestive heart failure (HCC) 03/08/2015  . Acute on chronic combined systolic (congestive) and diastolic (congestive) heart failure (HCC) 05/30/2019  . Morbid obesity with BMI of 50.0-59.9, adult (HCC)   . Diabetes mellitus type 2, noninsulin dependent (HCC) 05/29/2019  . Chronic renal insufficiency, stage III (moderate) (HCC) 11/12/2017  . Acute hypoxemic respiratory failure (HCC) 10/22/2017  . Tobacco abuse 01/23/2016  . OSA (obstructive sleep apnea) 03/08/2015    Resolved Hospital Problems  No resolved problems to display.    Discharge Condition: Stable  Diet recommendation: Resume previous diet, heart healthy low-sodium diet.  Vitals:   06/04/19 0824 06/04/19 0831  BP: 121/88   Pulse: 80   Resp: 18   Temp:  98.9 F (37.2 C)  SpO2: 98%     History of present illness:  37 y.o.malewith medical history significant ofCHF with EF 35-40%,hypertension, diet-controlled diabetes, OSA on CPAP, morbid obesity, tobacco abuse, gout, who presents with shortness of breathon 05/29/19 recently gained about 15 pounds of weight and having shortness of breath for several days, was progressively getting worse. In ER patient found to have hypoxia with saturation of 87% on room air, BNP 499- troponin stable renal function negative COVID-19. He was admitted for further management.  Diuresed with IV Lasix.  Seen by cardiology, underwent cardiac cath with EF of 45% with nonischemic cardiomyopathy.\  06/04/19: Patient seen and examined at his bedside.  No  acute events overnight.  He has no new concerns.  On the day of discharge, the patient was hemodynamically stable.  He will need to follow-up with cardiology and his primary care provider post hospitalization.  He will also need to take his medications as prescribed.   Hospital Course:  Principal Problem:   Acute combined systolic and diastolic congestive heart failure (HCC) Active Problems:   OSA (obstructive sleep apnea)   Tobacco abuse   Acute hypoxemic respiratory failure (HCC)   Chronic renal insufficiency, stage III (moderate) (HCC)   Diabetes mellitus type 2, noninsulin dependent (HCC)   Acute on chronic combined systolic (congestive) and diastolic (congestive) heart failure (HCC)   Morbid obesity with BMI of 50.0-59.9, adult (HCC)   Acute on chronic systolic CHF with prominent RV dysfunction Diuresed with IV Lasix Seen by cardiology with adjustment of cardiac medications Outpatient medications as recommended by cardiology: Continue Entresto 97/103 twice daily, spironolactone 50 mg daily, BIDIL 1 tablet 3 times daily, Coreg 12.5 mg twice daily, torsemide 40 mg twice daily.   Last 2D echo showed EF is 35 to 40% on echo with grade 1 diastolic dysfunction.  Hyperkalemia, resolved post diuresis Potassium 5.3 >> 4.0 Continue torsemide as recommended by cardiology Repeat BMP on Monday 06/08/19.  CKD 2 Baseline creatinine appears to be 1 with GFR greater than 60 Last creatinine 1.22 on 06/03/2019 with GFR greater than 60 Good urine output Net I&O -13.3 L since admission. Repeat BMP on Monday  OSAcontinue CPAP.  Saturating well on room air.  Tobacco abuse: Continue nicotine patch.  Tobacco cessation counseling at bedside.    Resolved acute hypoxic respiratory failure likely secondary to CHF exacerbation.  Saturating well on room air.   Diabetes mellitus type 2, noninsulin dependent, hba1c 6.5 last in 2016. Diet controlled. Blood sugar is stable.  Morbid obesity  with BMI of 50.0-59.9, adult : BMI 47.  Recommend weight loss outpatient with regular physical activity and healthy dieting.   Code Status:Full code   Consultants: Cardiology/heart failure team.  procedures: Cardiac cath: NICM 45% LVEF  Antimicrobials:    Anti-infectives(From admission, onward)   None    Discharge Exam: BP 121/88 (BP Location: Left Arm)   Pulse 80   Temp 98.9 F (37.2 C) (Oral)   Resp 18   Ht 6\' 1"  (1.854 m)   Wt (!) 164.8 kg Comment: scale c  SpO2 98%   BMI 47.93 kg/m  . General: 38 y.o. year-old male well developed well nourished in no acute distress.  Alert and oriented x3. . Cardiovascular: Regular rate and rhythm with no rubs or gallops.  No thyromegaly or JVD noted.   Marland Kitchen. Respiratory: Clear to auscultation with no wheezes or rales. Good inspiratory effort. . Abdomen: Soft nontender nondistended with normal bowel sounds x4 quadrants. . Musculoskeletal: Trace lower extremity edema. 2/4 pulses in all 4 extremities. Marland Kitchen. Psychiatry: Mood is appropriate for condition and setting  Discharge Instructions You were cared for by a hospitalist during your hospital stay. If you have any questions about your discharge medications or the care you received while you were in the hospital after you are discharged, you can call the unit and asked to speak with the hospitalist on call if the hospitalist that took care of you is not available. Once you are discharged, your primary care physician will handle any further medical issues. Please note that NO REFILLS for any discharge medications will be authorized once you are discharged, as it is imperative that you return to your primary care physician (or establish a relationship with a primary care physician if you do not have one) for your aftercare needs so that they can reassess your need for medications and monitor your lab values.   Allergies as of 06/04/2019      Reactions   Penicillins Swelling       Medication List    STOP taking these medications   cetirizine 10 MG tablet Commonly known as: ZYRTEC   colchicine 0.6 MG tablet   fluticasone 50 MCG/ACT nasal spray Commonly known as: FLONASE   furosemide 80 MG tablet Commonly known as: LASIX     TAKE these medications   aspirin 81 MG EC tablet Take 1 tablet (81 mg total) by mouth daily. Start taking on: June 05, 2019   BiDil 20-37.5 MG tablet Generic drug: isosorbide-hydrALAZINE Take 1 tablet by mouth 3 (three) times daily.   carvedilol 12.5 MG tablet Commonly known as: COREG Take 1 tablet (12.5 mg total) by mouth 2 (two) times daily with a meal. What changed: when to take this   Entresto 97-103 MG Generic drug: sacubitril-valsartan Take 1 tablet by mouth 2 (two) times daily.   nicotine 21 mg/24hr patch Commonly known as: NICODERM CQ - dosed in mg/24 hours Place 1 patch (21 mg total) onto the skin daily. Start taking on: June 05, 2019   spironolactone 50 MG tablet Commonly known as: ALDACTONE Take 1 tablet (50 mg total) by mouth daily.   torsemide 20 MG tablet Commonly known as: DEMADEX Take 2 tablets (40 mg total) by mouth 2 (two) times daily.      Allergies  Allergen Reactions  . Penicillins Swelling  Follow-up Information    [facility] Follow up.   Specialty: Cardiology Why: We will call you with a follow up.  Contact information: [address] [id number] [place] [phone]       Golden Circle, FNP. Call in 1 day(s).   Specialties: Family Medicine, Infectious Diseases Why: Please call for a post hospital appointment. Contact information: [place] [address] [phone]        Sanda Klein, MD .   Specialty: Cardiology Contact information: [address] [place] [address] [phone]            The results of significant diagnostics from  this hospitalization (including imaging, microbiology, ancillary and laboratory) are listed below for reference.    Significant Diagnostic Studies: Nm Pulmonary Perfusion  Result Date: 05/30/2019 CLINICAL DATA:  Short of breath. Negative D-dimer. Congestive heart failure EXAM: NUCLEAR MEDICINE PERFUSION LUNG SCAN TECHNIQUE: Perfusion images were obtained in multiple projections after intravenous injection of radiopharmaceutical. RADIOPHARMACEUTICALS:  1.5 mCi Tc-18m MAA COMPARISON:  Chest radiograph 05/29/2011 FINDINGS: No wedge-shaped peripheral perfusion defects to localized acute pulmonary embolism. There is decreased relative perfusion to the LEFT lung related to the attenuation from the enlarged cardiac silhouette. IMPRESSION: 1. No evidence of acute of pulmonary embolism. 2. Marked cardiomegaly. Electronically Signed   By: Suzy Bouchard M.D.   On: 05/30/2019 13:32   Dg Chest Portable 1 View  Result Date: 05/29/2019 CLINICAL DATA:  Swelling. Shortness of breath today. History of CHF and hypertension. EXAM: PORTABLE CHEST 1 VIEW COMPARISON:  10/21/2017 FINDINGS: Mild enlargement of the cardiopericardial silhouette, stable. No mediastinal or hilar masses. No convincing adenopathy. Lungs are clear.  No pleural effusion or pneumothorax. Skeletal structures are grossly intact. IMPRESSION: 1. No acute cardiopulmonary disease. 2. Stable cardiomegaly. Electronically Signed   By: Lajean Manes M.D.   On: 05/29/2019 18:00    Microbiology: Recent Results (from the past 240 hour(s))  SARS Coronavirus 2 (CEPHEID- Performed in [place] hospital lab), Hosp Order     Status: None   Collection Time: 05/29/19  5:23 PM   Specimen: Nasopharyngeal Swab  Result Value Ref Range Status   SARS Coronavirus 2 NEGATIVE NEGATIVE Final    Comment: (NOTE) If result is NEGATIVE SARS-CoV-2 target nucleic acids are NOT DETECTED. The SARS-CoV-2 RNA is generally detectable in upper and lower  respiratory specimens  during the acute phase of infection. The lowest  concentration of SARS-CoV-2 viral copies this assay can detect is 250  copies / mL. A negative result does not preclude SARS-CoV-2 infection  and should not be used as the sole basis for treatment or other  patient management decisions.  A negative result may occur with  improper specimen collection / handling, submission of specimen other  than nasopharyngeal swab, presence of viral mutation(s) within the  areas targeted by this assay, and inadequate number of viral copies  (<250 copies / mL). A negative result must be combined with clinical  observations, patient history, and epidemiological information. If result is POSITIVE SARS-CoV-2 target nucleic acids are DETECTED. The SARS-CoV-2 RNA is generally detectable in upper and lower  respiratory specimens dur ing the acute phase of infection.  Positive  results are indicative of active infection with SARS-CoV-2.  Clinical  correlation with patient history and other diagnostic information is  necessary to determine patient infection status.  Positive results do  not rule out bacterial infection or  co-infection with other viruses. If result is PRESUMPTIVE POSTIVE SARS-CoV-2 nucleic acids MAY BE PRESENT.   A presumptive positive result was obtained on the submitted specimen  and confirmed on repeat testing.  While 2019 novel coronavirus  (SARS-CoV-2) nucleic acids may be present in the submitted sample  additional confirmatory testing may be necessary for epidemiological  and / or clinical management purposes  to differentiate between  SARS-CoV-2 and other Sarbecovirus currently known to infect humans.  If clinically indicated additional testing with an alternate test  methodology [phone]) is advised. The SARS-CoV-2 RNA is generally  detectable in upper and lower respiratory sp ecimens during the acute  phase of infection. The expected result is Negative. Fact Sheet for Patients:   BoilerBrush.com.cy Fact Sheet for Healthcare Providers: https://pope.com/ This test is not yet approved or cleared by the [place] FDA and has been authorized for detection and/or diagnosis of SARS-CoV-2 by FDA under an Emergency Use Authorization (EUA).  This EUA will remain in effect (meaning this test can be used) for the duration of the COVID-19 declaration under Section 564(b)(1) of the Act, 21 U.S.C. section 360bbb-3(b)(1), unless the authorization is terminated or revoked sooner. Performed at [facility], [address] [place], [place] [address]      Labs: Basic Metabolic Panel: Recent Labs  Lab 05/29/19 1710 05/30/19 0520 05/31/19 0547 06/01/19 0523  06/01/19 1436 06/01/19 1437 06/01/19 1448 06/01/19 1648 06/02/19 0526 06/03/19 0824 06/03/19 2244  NA 142 143 142 141   < > 143 143 147*  --  138 137  --   K 4.3 4.4 4.3 3.7   < > 3.6 3.6 2.9*  --  3.9 5.3* 4.0  CL 99 97* 93* 94*  --   --   --   --   --  94* 93*  --   CO2 31 36* 38* 37*  --   --   --   --   --  32 32  --   GLUCOSE 135* 108* 100* 99  --   --   --   --   --  105* 119*  --   BUN 21* 18 16 16   --   --   --   --   --  17 16  --   CREATININE 1.23 1.25* 1.25* 1.25*  --   --   --   --  1.04 1.16 1.22  --   CALCIUM 8.5* 8.7* 8.9 9.2  --   --   --   --   --  8.8* 9.1  --   MG 2.0  --   --  2.3  --   --   --   --   --   --   --   --    < > = values in this interval not displayed.   Liver Function Tests: No results for input(s): AST, ALT, ALKPHOS, BILITOT, PROT, ALBUMIN in the last 168 hours. No results for input(s): LIPASE, AMYLASE in the last 168 hours. No results for input(s): AMMONIA in the last 168 hours. CBC: Recent Labs  Lab 05/29/19 1710  06/01/19 1430 06/01/19 1436 06/01/19 1437 06/01/19 1448 06/01/19 1648  WBC 6.8  --   --   --   --   --  4.4  HGB 15.7   < > 17.3* 17.7* 17.7* 16.3 16.7  HCT 52.0   < > 51.0 52.0 52.0 48.0 53.9*  MCV  86.4  --   --   --   --   --  83.2  PLT 290  --   --   --   --   --  290   < > = values in this interval not displayed.   Cardiac Enzymes: Recent Labs  Lab 05/29/19 1710  TROPONINI <0.03   BNP: BNP (last 3 results) Recent Labs    05/29/19 1710  BNP 499.5*    ProBNP (last 3 results) No results for input(s): PROBNP in the last 8760 hours.  CBG: No results for input(s): GLUCAP in the last 168 hours.     Signed:  Darlin Droparole N Jamari Moten, MD [facility] 06/04/2019, 9:54 AM

## 2019-06-10 ENCOUNTER — Telehealth: Payer: Self-pay | Admitting: Cardiovascular Disease

## 2019-06-10 NOTE — Telephone Encounter (Signed)
Left message to call back  

## 2019-06-10 NOTE — Telephone Encounter (Signed)
  Patient was discharged from hospital on 06/04/19 and he just started back to work yesterday. He was told not to stand too long or sit too long or be out in high heat. He would like to know if he should be on light duty or if it is okay for him to be at work since he works in the heat. He needs a note stating what he can and cannot do.

## 2019-06-10 NOTE — Telephone Encounter (Signed)
Pt recently had a Cath procedure and was instructed that he shouldn't be outside in the heat or standing for a long period of time. He report the note he received at discharge was not sufficient for his job and needing a note specifying his restriction.   Will route to MD and Nurse.

## 2019-06-11 NOTE — Telephone Encounter (Signed)
Left message for pt to call.

## 2019-06-11 NOTE — Telephone Encounter (Signed)
Please provide pt a note regarding work restrictions

## 2019-06-15 ENCOUNTER — Encounter (HOSPITAL_COMMUNITY): Payer: Self-pay

## 2019-06-15 ENCOUNTER — Ambulatory Visit (HOSPITAL_COMMUNITY)
Admission: RE | Admit: 2019-06-15 | Discharge: 2019-06-15 | Disposition: A | Discharge status: Home / Self Care | Payer: Managed Care, Other (non HMO) | Source: Ambulatory Visit | Attending: Internal Medicine | Admitting: Internal Medicine

## 2019-06-15 ENCOUNTER — Encounter (HOSPITAL_COMMUNITY): Payer: Self-pay | Admitting: Cardiology

## 2019-06-15 ENCOUNTER — Other Ambulatory Visit: Payer: Self-pay

## 2019-06-15 VITALS — BP 128/86 | HR 81 | Wt 359.6 lb

## 2019-06-15 DIAGNOSIS — J45909 Unspecified asthma, uncomplicated: Secondary | ICD-10-CM | POA: Diagnosis not present

## 2019-06-15 DIAGNOSIS — Z79899 Other long term (current) drug therapy: Secondary | ICD-10-CM | POA: Insufficient documentation

## 2019-06-15 DIAGNOSIS — I5042 Chronic combined systolic (congestive) and diastolic (congestive) heart failure: Secondary | ICD-10-CM | POA: Diagnosis present

## 2019-06-15 DIAGNOSIS — Z6841 Body Mass Index (BMI) 40.0 and over, adult: Secondary | ICD-10-CM | POA: Diagnosis not present

## 2019-06-15 DIAGNOSIS — I5082 Biventricular heart failure: Secondary | ICD-10-CM | POA: Insufficient documentation

## 2019-06-15 DIAGNOSIS — I2781 Cor pulmonale (chronic): Secondary | ICD-10-CM | POA: Insufficient documentation

## 2019-06-15 DIAGNOSIS — I428 Other cardiomyopathies: Secondary | ICD-10-CM | POA: Insufficient documentation

## 2019-06-15 DIAGNOSIS — F17210 Nicotine dependence, cigarettes, uncomplicated: Secondary | ICD-10-CM | POA: Insufficient documentation

## 2019-06-15 DIAGNOSIS — E119 Type 2 diabetes mellitus without complications: Secondary | ICD-10-CM | POA: Insufficient documentation

## 2019-06-15 DIAGNOSIS — G4733 Obstructive sleep apnea (adult) (pediatric): Secondary | ICD-10-CM | POA: Insufficient documentation

## 2019-06-15 DIAGNOSIS — F17200 Nicotine dependence, unspecified, uncomplicated: Secondary | ICD-10-CM | POA: Diagnosis not present

## 2019-06-15 DIAGNOSIS — Z8249 Family history of ischemic heart disease and other diseases of the circulatory system: Secondary | ICD-10-CM | POA: Diagnosis not present

## 2019-06-15 DIAGNOSIS — I2721 Secondary pulmonary arterial hypertension: Secondary | ICD-10-CM | POA: Diagnosis not present

## 2019-06-15 DIAGNOSIS — I110 Hypertensive heart disease with heart failure: Secondary | ICD-10-CM | POA: Diagnosis not present

## 2019-06-15 DIAGNOSIS — Z7982 Long term (current) use of aspirin: Secondary | ICD-10-CM | POA: Insufficient documentation

## 2019-06-15 DIAGNOSIS — Z803 Family history of malignant neoplasm of breast: Secondary | ICD-10-CM | POA: Diagnosis not present

## 2019-06-15 LAB — BASIC METABOLIC PANEL
Anion gap: 9 (ref 5–15)
BUN: 16 mg/dL (ref 6–20)
CO2: 30 mmol/L (ref 22–32)
Calcium: 9.2 mg/dL (ref 8.9–10.3)
Chloride: 101 mmol/L (ref 98–111)
Creatinine, Ser: 1.21 mg/dL (ref 0.61–1.24)
GFR calc Af Amer: >60 mL/min (ref >60)
GFR calc non Af Amer: >60 mL/min (ref >60)
Glucose, Bld: 105 mg/dL — ABNORMAL HIGH (ref 70–99)
Potassium: 4.4 mmol/L (ref 3.5–5.1)
Sodium: 140 mmol/L (ref 135–145)

## 2019-06-15 NOTE — Telephone Encounter (Signed)
Left a message for the patient to call back.  

## 2019-06-15 NOTE — Progress Notes (Signed)
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   LV EF has been known to be low since 3/16, echo showed E 40% at that point.  Last echo in 11/18 showed EF 35-40%, moderately decreased RV systolic function, and D-shaped interventricular septum suggestive of RV pressure/volume overload.  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  Admitted in 6/13 /20 with increased shortness of breath. Diuresed with IV lasix and transitioned to torsemide 40 mg twice a day. Also underwent RHC/LHC that showed normal coronaries, normal RV, and elelvated filling pressures.  Overall diuresed ~ 30 pounds. Discharge weight 363 pounds.   Today he returns for HF post hospital follow up. Overall feeling fine. Mild sob with exertion. Denies PND/Orthopnea. Using CPAP every night. Appetite ok. Tries to limit portions.  No fever or chills. Weight at home 359 pounds. Taking all medications. He is back at work full time. Smoking 1/2 PPD.  Cardiac Testing RHC/LHC  06/01/2019 Ao = 123/88 (104)  LV =  118/23 RA =  22 RV = 58/25 PA = 56/20 (38) PCW = 14 Fick cardiac output/index = 10.3/3.7 PVR = 2.3 Ao sat = 90% PA sat = 72%, 69% Assessment: 1. Normal coronary arteries  2. NICM EF 45% 3. Mild to moderate PAH with RV strain 05/30/2019 Echo with EF 40-45%. RV appears normal. There is septal flattening suggestive of RV pressure volume/overload. Personally reviewed  ROS: All systems negative except as listed in HPI, PMH and Problem List.  SH:  Social History   Socioeconomic History  . Marital status: Married    Spouse name: Not on file  . Number of children: 0  . Years of education: 61  . Highest education level: Not on file  Occupational History  .  Occupation: Librarian, academic at [employer]  . Financial resource strain: Not on file  . Food insecurity    Worry: Not on file    Inability: Not on file  . Transportation needs    Medical: Not on file    Non-medical: Not on file  Tobacco Use  . Smoking status: Current Every Day Smoker    Packs/day: 0.50    Years: 19.00    Pack years: 9.50    Types: Cigarettes    Start date: 01/05/1996    Last attempt to quit: 03/04/2015    Years since quitting: 4.2  . Smokeless tobacco: Never Used  . Tobacco comment: Already decreasaed smoking  Substance and Sexual Activity  . Alcohol use: No    Alcohol/week: 1.0 - 2.0 standard drinks    Types: 1 - 2 Standard drinks or equivalent per week    Comment: Occasional  . Drug use: No  . Sexual activity: Yes    Birth control/protection: Condom  Lifestyle  . Physical activity    Days per week: Not on file    Minutes per session: Not on file  . Stress: Not on file  Relationships  . Social Herbalist on phone: Not on file    Gets together: Not on file    Attends religious service: Not on file    Active member of club or organization: Not on file    Attends meetings of clubs or organizations: Not on file  Relationship status: Not on file  . Intimate partner violence    Fear of current or ex partner: Not on file    Emotionally abused: Not on file    Physically abused: Not on file    Forced sexual activity: Not on file  Other Topics Concern  . Not on file  Social History Narrative   Born and raised in [place]. Fun: Bowling, movies, we go   Denies religious beliefs that would effect health care.     FH:  Family History  Problem Relation Age of Onset  . Hypertension Mother   . Hyperlipidemia Mother   . Heart failure Mother   . Multiple sclerosis Mother   . Breast cancer Maternal Grandmother   . Diabetes Maternal Grandmother   . Colon cancer Maternal Uncle     Past Medical History:  Diagnosis Date  . 2nd  degree AV block    a. noted during 02/2015 admission - second degree AV AV block (not further defined in notes). Not on BB due to this.  . Asthma    Childhood  . Bradycardia    a. noted during 02/2015 admission - second degree AV AV block (not further defined in notes). Not on BB due to this.  . Chronic combined systolic and diastolic CHF (congestive heart failure) (HCC)    a. echo 03/06/15 showing mild LVH, EF 40%, high ventricular filling pressures, mod MR, mildly dilated RV/mildly reduced RV function, mildly dilated RA, PASP .  . Diabetes mellitus, type 2 (HCC)   . Essential hypertension   . Family history of adverse reaction to anesthesia    mother and aunt  both put to sleep for surgery and "stopped breathing"  . Morbid obesity (HCC)   . NSVT (nonsustained ventricular tachycardia) (HCC)    a. noted during 02/2015 admission.  . OSA (obstructive sleep apnea)    a. noncompliance with CPAP - wakes up with it off.    Current Outpatient Medications  Medication Sig Dispense Refill  . aspirin EC 81 MG EC tablet Take 1 tablet (81 mg total) by mouth daily. 30 tablet 0  . carvedilol (COREG) 12.5 MG tablet Take 1 tablet (12.5 mg total) by mouth 2 (two) times daily with a meal. 180 tablet 0  . isosorbide-hydrALAZINE (BIDIL) 20-37.5 MG tablet Take 1 tablet by mouth 3 (three) times daily. 90 tablet 0  . nicotine (NICODERM CQ - DOSED IN MG/24 HOURS) 21 mg/24hr patch Place 1 patch (21 mg total) onto the skin daily. 28 patch 0  . sacubitril-valsartan (ENTRESTO) 97-103 MG Take 1 tablet by mouth 2 (two) times daily. 60 tablet 0  . spironolactone (ALDACTONE) 50 MG tablet Take 1 tablet (50 mg total) by mouth daily. 30 tablet 0  . torsemide (DEMADEX) 20 MG tablet Take 2 tablets (40 mg total) by mouth 2 (two) times daily. 60 tablet 0   No current facility-administered medications for this encounter.     Vitals:   06/15/19 1010  BP: 128/86  Pulse: 81  SpO2: 91%  Weight: (!) 163.1 kg (359 lb 9.6  oz)   Wt Readings from Last 3 Encounters:  06/15/19 (!) 163.1 kg  06/04/19 (!) 164.8 kg  05/29/19 (!) 165.6 kg     PHYSICAL EXAM: General:  Well appearing. No resp difficulty.  HEENT: normal Neck: supple. JVP flat. Carotids 2+ bilaterally; no bruits. No lymphadenopathy or thryomegaly appreciated. Cor: PMI normal. Regular rate & rhythm. No rubs, gallops or murmurs. Lungs: clear Abdomen: obese, soft, nontender,  nondistended. No hepatosplenomegaly. No bruits or masses. Good bowel sounds. Extremities: no cyanosis, clubbing, rash, edema Neuro: alert & orientedx3, cranial nerves grossly intact. Moves all 4 extremities w/o difficulty. Affect pleasant.   ECG: Sr 87 bpm RBBB   ASSESSMENT & PLAN: 1. Chronic systolic CHF with prominent RV dysfunction: Echo in 2018 showed EF 35-40%, diffuse hypokinesis, D-shaped interventricular septum, moderately dysfunctional RV. Suspect cor pulmonale. Cause of RV failure uncertain, he does have significant OSA. However, no evidence so far for OHS (oxygen saturation ok on room air during day so far). - - Echo 05/30/19 with EF 40-45%. RV appears normal. There is septal flattening suggestive of RV pressure volume/overload. -- R/L cath 6/15 . Normal cors.  - Volume status stable. Continue torsemide 40 mg twice a day. Check BMET today.  - Continue Entresto 97/103 bid, spironolactone 50 daily, Bidil 1 tab tid, Coreg 12.5 mg bid.  2. OSA/?OHS: Continue nightly CPAP.  3. 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 Maintaining NSR.  4. HTN: stable.  5. Morbid obesity Body mass index is 47.44 kg/m. Discussed portion control.  6. Tobacco Abuse Discussed smoking cessation.   Provided letter for work today.   Follow up 4-6 weeks.   Amy Clegg NP-C  1:09 PM

## 2019-06-18 NOTE — Telephone Encounter (Signed)
Letter for work provided at appointment on 06/15/2019

## 2019-06-22 ENCOUNTER — Inpatient Hospital Stay: Payer: Managed Care, Other (non HMO) | Admitting: Family

## 2019-06-22 ENCOUNTER — Telehealth: Payer: Self-pay | Admitting: Cardiovascular Disease

## 2019-06-22 NOTE — Telephone Encounter (Signed)
Patient states that he was returning a call from our office. No documentation of who called the patient

## 2019-06-22 NOTE — Telephone Encounter (Signed)
Returned call to patient in response to this message. He is returning a call concerning some [employer] paperwork. Sent to medical records  [place], [place] to Sanda Klein, MD     06/22/19 12:05 PM Hello dr. Sallyanne Kuster this is [place] and i need your help.

## 2019-06-23 NOTE — Telephone Encounter (Signed)
7/72020 Received signed [employer] Form back from Dr. Sallyanne Kuster I then inter-office it to [place] to finish processing.  cbr

## 2019-06-23 NOTE — Telephone Encounter (Signed)
The patient has been informed that his FMLA papers have been signed.

## 2019-06-30 ENCOUNTER — Telehealth: Payer: Self-pay | Admitting: Cardiovascular Disease

## 2019-06-30 NOTE — Telephone Encounter (Signed)
Attempted to contact patient back. No answer and unable to leave voicemail. Will continue efforts.

## 2019-06-30 NOTE — Telephone Encounter (Signed)
New message:    Patient calling stating that his wife works in a nursing home and some people was tested for the virus. Please call patient back.

## 2019-06-30 NOTE — Telephone Encounter (Signed)
Patient informed of Dr. Victorino December recommendations. No further questions.

## 2019-06-30 NOTE — Telephone Encounter (Signed)
Social distancing, mask wearing, frequent hand sanitizing (as you pointed out), no other recommendations

## 2019-06-30 NOTE — Telephone Encounter (Signed)
Called patient back. He has been tested for coivd and it was negative . He just wants to make sure it is okay for him to continue working. He works at [employer]  His wife works in a nursing home and they have confirmed cases there. His wife changes before he gets home and makes sure to wash her hands. Neither of them have symptoms he just wants to be safe with his heart condition. I advised patient to wear a mask when in public and follow cdc guidelines to prevent catching. Patient verbally understands. I will consult with Dr. Sallyanne Kuster.

## 2019-07-06 ENCOUNTER — Telehealth: Payer: Self-pay | Admitting: Cardiovascular Disease

## 2019-07-06 NOTE — Telephone Encounter (Signed)
Spoke to patient he stated he has gout in right foot.He does not have a PCP.He wanted to ask Dr.Croitoru if he would prescribe prednisone or a gout medication.Message sent to Dr.Croitoru for advice.

## 2019-07-06 NOTE — Telephone Encounter (Signed)
°  Patient is having a gout flare-up. He would like to know if Dr. Loletha Grayer would write him a prescription for prednisone. He also states he takes another medication along with the prednisone, but he is unsure as to what it is.   He still uses [id number] - [place], [place] [address]

## 2019-07-06 NOTE — Telephone Encounter (Signed)
Please take colchicine 0.6 mg daily for 5 days and prednisone 20 mg daily for 3 days.

## 2019-07-06 NOTE — Telephone Encounter (Signed)
Left message for patient to call and schedule 1-2 week appointment with APP per 07/05/19 staff message from Kathryne Sharper, RN

## 2019-07-07 MED ORDER — PREDNISONE 20 MG PO TABS: ORAL_TABLET | ORAL | 0 refills | Status: DC

## 2019-07-07 MED ORDER — COLCHICINE 0.6 MG PO TABS: ORAL_TABLET | ORAL | 0 refills | Status: DC

## 2019-07-07 NOTE — Telephone Encounter (Signed)
Called patient left Dr.Croitoru's recommendation on personal voice mail.Prescriptions sent to your pharmacy.Advised to call back if needed.

## 2019-07-10 ENCOUNTER — Telehealth: Payer: Self-pay | Admitting: Physician Assistant

## 2019-07-10 NOTE — Telephone Encounter (Signed)

## 2019-07-13 ENCOUNTER — Ambulatory Visit (INDEPENDENT_AMBULATORY_CARE_PROVIDER_SITE_OTHER): Payer: Managed Care, Other (non HMO) | Admitting: Physician Assistant

## 2019-07-13 ENCOUNTER — Encounter: Payer: Self-pay | Admitting: Physician Assistant

## 2019-07-13 ENCOUNTER — Other Ambulatory Visit: Payer: Self-pay

## 2019-07-13 VITALS — BP 117/69 | HR 101 | Temp 97.3°F | Ht 73.0 in | Wt 382.0 lb

## 2019-07-13 DIAGNOSIS — I110 Hypertensive heart disease with heart failure: Secondary | ICD-10-CM

## 2019-07-13 DIAGNOSIS — G4733 Obstructive sleep apnea (adult) (pediatric): Secondary | ICD-10-CM

## 2019-07-13 DIAGNOSIS — M109 Gout, unspecified: Secondary | ICD-10-CM

## 2019-07-13 DIAGNOSIS — I5043 Acute on chronic combined systolic (congestive) and diastolic (congestive) heart failure: Secondary | ICD-10-CM

## 2019-07-13 DIAGNOSIS — Z6841 Body Mass Index (BMI) 40.0 and over, adult: Secondary | ICD-10-CM

## 2019-07-13 MED ORDER — COLCHICINE 0.6 MG PO TABS: 0.6000 mg | ORAL_TABLET | Freq: Every day | ORAL | 0 refills | Status: DC

## 2019-07-13 MED ORDER — POTASSIUM CHLORIDE CRYS ER 20 MEQ PO TBCR: 20.0000 meq | EXTENDED_RELEASE_TABLET | Freq: Two times a day (BID) | ORAL | 0 refills | Status: DC

## 2019-07-13 MED ORDER — TORSEMIDE 20 MG PO TABS: 40.0000 mg | ORAL_TABLET | Freq: Two times a day (BID) | ORAL | 0 refills | Status: DC

## 2019-07-13 MED ORDER — SPIRONOLACTONE 50 MG PO TABS: 50.0000 mg | ORAL_TABLET | Freq: Every day | ORAL | 1 refills | Status: AC

## 2019-07-13 MED ORDER — METOLAZONE 2.5 MG PO TABS: 2.5000 mg | ORAL_TABLET | Freq: Every day | ORAL | 0 refills | Status: DC

## 2019-07-13 NOTE — Progress Notes (Signed)
Cardiology Office Note    Date:  07/13/2019   ID:  Roger Ward, DOB 04/02/1981, MRN [id number]  PCP:  Patient, No Pcp Per  Cardiologist:  Dr Royann Shiversroitoru [facility]: Dr. Gala RomneyBensimhon  Chief Complaint  Patient presents with   Follow-up    seen for Dr. Royann Shiversroitoru    History of Present Illness:  Roger BasqueJerome L Ward is a 38 y.o. male with past medical history of super morbid obesity, malignant hypertension, suspected OSA, biventricular failure, mild to moderate LV dysfunction, moderate pulmonary artery hypertension.  Previous echocardiogram obtained on 03/06/2015 showed EF 40%, diffuse hypokinesis, moderate MR.  48-hour Holter monitor obtained in March 2017 showed normal sinus rhythm with occasional PVCs.  He was admitted with acute on chronic systolic heart failure secondary to his noncompliance with medical treatment in November 2018.  His admission weight was 50 pounds above his baseline dry weight.  Repeat echocardiogram obtained on 10/22/2017 showed EF 35 to 40%, diffuse hypokinesis, D-shaped interventricular septum suggestive of RV pressure overload, moderately reduced RV EF, PA peak pressure 40 mmHg.  He was not on beta-blocker previously secondary to second-degree AV block.  During the hospitalization, he did have runs of nonsustained VT.  Low-dose carvedilol was added.  Sherryll Burgerntresto was added as outpatient.    Patient has been followed by both general cardiology and heart failure service.  More recently, he was admitted in June 2020 with increasing shortness of breath.  He underwent IV diuresis and transition to torsemide 40 mg twice daily.   He also underwent a left and right heart cath that showed normal coronary arteries, normal RV and elevated filling pressure.  He diuresed 30 pounds during the hospitalization and the discharged with a dry weight of 363 pounds.  He was seen in the heart failure service for follow-up in 06/15/2019, his weight at home was 359 pounds.  Patient presents today  for cardiology office visit.  Based on today's weight, he has gained 23 pounds.  He is still having right ankle gout flare and is currently taking colchicine.  I recommended 2 more weeks of colchicine therapy.  His O2 saturation in the office was between 86 to 87% sitting down, this is likely related to obesity hypoventilation syndrome.  Unfortunately, he has very frequent episode of recurrent heart failure.  In the past 2 weeks, he has not been taking his torsemide as he ran out of the medication.  Instead he has been taking 80 mg twice daily of Lasix which he got in the past.  I asked him to stop the Lasix and started on the torsemide at 60 mg twice daily for 3 days before titrating down to 40 mg twice daily thereafter.  For the next 2 days, he will take 2.5 mg metolazone every morning 30 minutes prior to his morning diuretic.  Metolazone will be discontinued after 2 days.  He will also need a potassium supplement 20 mEq twice daily for 3 days given the aggressive diuresis.  He needs a 1 week basic metabolic panel and follow-up with heart failure service on August 10.  He does not have a home scale, we have obtained a home scale for him.  Unfortunately, patient also lost his job 2 weeks ago.  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  And that he ended up falling asleep several times on the job.  He has not been using his CPAP machine very frequently (he mentions he probably need  a new machine as his previous machine was old), I suspect his daytime somnolence may be related to obstructive sleep apnea combined with obesity hypoventilation syndrome and the amount of heat that was in the warehouse.  He wished to know if he would qualify for long-term disability with his current condition.  I will need to discuss this with Dr. Royann Shiversroitoru.   Past Medical History:  Diagnosis Date   2nd degree AV block    a. noted during 02/2015 admission - second degree AV AV block (not  further defined in notes). Not on BB due to this.   Asthma    Childhood   Bradycardia    a. noted during 02/2015 admission - second degree AV AV block (not further defined in notes). Not on BB due to this.   Chronic combined systolic and diastolic CHF (congestive heart failure) (HCC)    a. echo 03/06/15 showing mild LVH, EF 40%, high ventricular filling pressures, mod MR, mildly dilated RV/mildly reduced RV function, mildly dilated RA, PASP 45mmHg.   Diabetes mellitus, type 2 (HCC)    Essential hypertension    Family history of adverse reaction to anesthesia    mother and aunt  both put to sleep for surgery and "stopped breathing"   Morbid obesity (HCC)    NSVT (nonsustained ventricular tachycardia) (HCC)    a. noted during 02/2015 admission.   OSA (obstructive sleep apnea)    a. noncompliance with CPAP - wakes up with it off.    Past Surgical History:  Procedure Laterality Date   RIGHT/LEFT HEART CATH AND CORONARY ANGIOGRAPHY N/A 06/01/2019   Procedure: RIGHT/LEFT HEART CATH AND CORONARY ANGIOGRAPHY;  Surgeon: Dolores PattyBensimhon, Daniel R, MD;  Location: [facility];  Service: Cardiovascular;  Laterality: N/A;    Current Medications: Outpatient Medications Prior to Visit  Medication Sig Dispense Refill   aspirin EC 81 MG EC tablet Take 1 tablet (81 mg total) by mouth daily. 30 tablet 0   carvedilol (COREG) 12.5 MG tablet Take 1 tablet (12.5 mg total) by mouth 2 (two) times daily with a meal. 180 tablet 0   furosemide (LASIX) 80 MG tablet Take 80 mg by mouth.     isosorbide-hydrALAZINE (BIDIL) 20-37.5 MG tablet Take 1 tablet by mouth 3 (three) times daily. 90 tablet 0   nicotine (NICODERM CQ - DOSED IN MG/24 HOURS) 21 mg/24hr patch Place 1 patch (21 mg total) onto the skin daily. 28 patch 0   sacubitril-valsartan (ENTRESTO) 97-103 MG Take 1 tablet by mouth 2 (two) times daily. 60 tablet 0   colchicine 0.6 MG tablet Take 0.6 mg daily for 5 days 5 tablet 0   spironolactone  (ALDACTONE) 50 MG tablet Take 1 tablet (50 mg total) by mouth daily. 30 tablet 0   predniSONE (DELTASONE) 20 MG tablet Take 20 mg daily for 3 days (Patient not taking: Reported on 07/13/2019) 3 tablet 0   torsemide (DEMADEX) 20 MG tablet Take 2 tablets (40 mg total) by mouth 2 (two) times daily. (Patient not taking: Reported on 07/13/2019) 60 tablet 0   No facility-administered medications prior to visit.      Allergies:   Penicillins   Social History   Socioeconomic History   Marital status: Married    Spouse name: Not on file   Number of children: 0   Years of education: 12   Highest education level: Not on file  Occupational History   Occupation: Merchandiser, retailupervisor at [employer]  Social Needs   Financial  resource strain: Not on file   Food insecurity    Worry: Not on file    Inability: Not on file   Transportation needs    Medical: Not on file    Non-medical: Not on file  Tobacco Use   Smoking status: Current Every Day Smoker    Packs/day: 0.50    Years: 19.00    Pack years: 9.50    Types: Cigarettes    Start date: 01/05/1996    Last attempt to quit: 03/04/2015    Years since quitting: 4.3   Smokeless tobacco: Never Used   Tobacco comment: Already decreasaed smoking  Substance and Sexual Activity   Alcohol use: No    Alcohol/week: 1.0 - 2.0 standard drinks    Types: 1 - 2 Standard drinks or equivalent per week    Comment: Occasional   Drug use: No   Sexual activity: Yes    Birth control/protection: Condom  Lifestyle   Physical activity    Days per week: Not on file    Minutes per session: Not on file   Stress: Not on file  Relationships   Social connections    Talks on phone: Not on file    Gets together: Not on file    Attends religious service: Not on file    Active member of club or organization: Not on file    Attends meetings of clubs or organizations: Not on file    Relationship status: Not on file  Other Topics Concern   Not on  file  Social History Narrative   [place] Fun: Bowling, movies, we go   Denies religious beliefs that would effect health care.      Family History:  The patient's family history includes Breast cancer in his maternal grandmother; Colon cancer in his maternal uncle; Diabetes in his maternal grandmother; Heart failure in his mother; Hyperlipidemia in his mother; Hypertension in his mother; Multiple sclerosis in his mother.   ROS:   Please see the history of present illness.    ROS All other systems reviewed and are negative.   PHYSICAL EXAM:   VS:  BP 117/69    Pulse (!) 101    Temp (!) 97.3 F (36.3 C)    Ht 6\' 1"  (1.854 m)    Wt (!) 382 lb (173.3 kg)    SpO2 (!) 87%    BMI 50.40 kg/m    GEN: Well nourished, well developed, in no acute distress  HEENT: normal  Neck: no JVD, carotid bruits, or masses Cardiac: RRR; no murmurs, rubs, or gallops. 3+ bilateral LE edema  Respiratory:  clear to auscultation bilaterally, normal work of breathing GI: soft, nontender, nondistended, + BS MS: no deformity or atrophy  Skin: warm and dry, no rash Neuro:  Alert and Oriented x 3, Strength and sensation are intact Psych: euthymic mood, full affect  Wt Readings from Last 3 Encounters:  07/13/19 (!) 382 lb (173.3 kg)  06/15/19 (!) 359 lb 9.6 oz (163.1 kg)  06/04/19 (!) 363 lb 4.8 oz (164.8 kg)      Studies/Labs Reviewed:   EKG:  EKG is not ordered today.    Recent Labs: 05/29/2019: B Natriuretic Peptide 499.5 06/01/2019: Hemoglobin 16.7; Magnesium 2.3; Platelets 290 06/15/2019: BUN 16; Creatinine, Ser 1.21; Potassium 4.4; Sodium 140   Lipid Panel    Component Value Date/Time   CHOL 166 03/06/2015 0155   TRIG 248 (H) 03/06/2015 0155   HDL 42 03/06/2015 0155  CHOLHDL 4.0 03/06/2015 0155   VLDL 50 (H) 03/06/2015 0155   LDLCALC 74 03/06/2015 0155    Additional studies/ records that were reviewed today include:   Echo 05/30/2019 IMPRESSIONS    1. The left  ventricle has mild-moderately reduced systolic function, with an ejection fraction of 40-45%. The cavity size was mildly dilated. is abnormal, indeterminate grade.  2. The right ventricle has normal systolic function. The cavity was mildly enlarged. There is no increase in right ventricular wall thickness.  3. RV TAPSE and tissue anular velocity are normal supporting normal RV function. There is septal flattening in systole suggesting RV pressure overload.  4. Left atrial size was mildly dilated.  5. No evidence of mitral valve stenosis.  6. The aortic valve is tricuspid. No stenosis of the aortic valve.  7. The aortic root is normal in size and structure.  8. Pulmonary hypertension is indeterminant, inadequate TR jet.  9. The inferior vena cava was dilated in size with <50% respiratory variability. 10. The interatrial septum was not well visualized.   Cath 06/01/2019 Findings:  Ao = 123/88 (104)  LV =  118/23 RA =  22 RV = 58/25 PA = 56/20 (38) PCW = 14 Fick cardiac output/index = 10.3/3.7 PVR = 2.3 Ao sat = 90% PA sat = 72%, 69%  Assessment: 1. Normal coronary arteries  2. NICM EF 45% 3. Mild to moderate PAH with RV strain  Plan/Discussion:  Continue medical management. Needs weight loss and CPAP.   ASSESSMENT:    1. Acute on chronic combined systolic and diastolic CHF (congestive heart failure) ([place])   2. Acute gout of right ankle, unspecified cause   3. Morbid obesity with BMI of 50.0-59.9, adult ([place])   4. Hypertensive heart disease with heart failure ([place])   5. OSA (obstructive sleep apnea)      PLAN:  In order of problems listed above:  1. Acute on chronic combined systolic and diastolic heart failure: He has gained 23 pounds recently.  Apparently he ran out of his torsemide and has been taking the previous Lasix at 80 mg twice daily.  I will send you a new prescription for torsemide.  I plan to aggressively diurese him with 2 days of 2.5 mg daily of  metolazone taken 30 minutes prior to the morning diuretic.  Increase torsemide to 60 mg twice a day for 3 days before titrating the dose down to 40 mg twice daily afterward.  For the next 3 days, given the aggressive need for diuresis, I will also start him on potassium 20 meq twice daily for a total of 6 doses.  He will need a 1 week basic metabolic panel.  We have obtained a home scale for him.  He has been instructed to check his weight and a documented weight diary on a daily basis.  2. Chronic respiratory failure: Likely due to combination of obesity hypoventilation syndrome, obstructive sleep apnea and acute heart failure.  Baseline O2 saturation in the office was 86-87%.  I discussed the case with DOD Dr. Ellyn Hack, since he is not particularly short of breath, we decided not to admit the patient but instead to try to aggressively diurese him as outpatient.   -I would will discussed the case with Dr. Sallyanne Kuster, unfortunately Mr. 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  He has daytime somnolence likely related to uncontrolled obstructive sleep apnea.  He says the warehouse was very  hot at the time.  This is not the first time he has fallen asleep during work.  He is inquiring about potential possibility of obtaining disability given his recurrent heart failure symptom.  I will need to review this with Dr. Royann Shivers  3. Right ankle gout: He will need 2 more weeks of colchicine as the pain is still persist.  Gout flare may persist given the aggressive need for diuresis  4. Morbid obesity: Weight loss is imperative.  Likely has underlying obesity hypoventilation syndrome  5. Obstructive sleep apnea: He has not been compliant with his CPAP therapy  6. Hypertension: Blood pressure stable    Medication Adjustments/Labs and Tests Ordered: Current medicines are reviewed at length with the patient today.  Concerns regarding medicines are outlined above.   Medication changes, Labs and Tests ordered today are listed in the Patient Instructions below. Patient Instructions  Medication Instructions:   ADD Metolazone 2.5 mg for 2 days with Morning medications-Take 30 mins prior to Torsemide  INCREASE Torsemide to 60 mg (3 tablets) 2 times a day for 3 days then 40 mg (2 tablets) 2 times a day thereafter  ADD Potassium 20 meq 2 times a day for 3 days total of 6 tablets then stop  CONTINUE Colchicine 0.6 ng daily for 2 weeks for gout in right foot/ankle   If you need a refill on your cardiac medications before your next appointment, please call your pharmacy.   Lab work: You will need to have labs (blood work) drawn in 1 week:  BMET  If you have labs (blood work) drawn today and your tests are completely normal, you will receive your results only by:  MyChart Message (if you have MyChart) OR  A paper copy in the mail If you have any lab test that is abnormal or we need to change your treatment, we will call you to review the results.  Testing/Procedures: NONE ordered at this time of appointment   Follow-Up: At [facility], you and your health needs are our priority.  As part of our continuing mission to provide you with exceptional heart care, we have created designated Provider Care Teams.  These Care Teams include your primary Cardiologist (physician) and Advanced Practice Providers (APPs -  Physician Assistants and Nurse Practitioners) who all work together to provide you with the care you need, when you need it.  Your physician recommends that you keep your scheduled appointment with the Heart Failure Clinic    Any Other Special Instructions Will Be Listed Below (If Applicable).  Weigh self daily and record on weight log       Ramond Dial, [place]  07/13/2019 12:45 PM    [facility] [address] [place], [place], [place]  [address] Phone: [phone]; Fax: [phone]

## 2019-07-13 NOTE — Patient Instructions (Addendum)
Medication Instructions:   ADD Metolazone 2.5 mg for 2 days with Morning medications-Take 30 mins prior to Torsemide  INCREASE Torsemide to 60 mg (3 tablets) 2 times a day for 3 days then 40 mg (2 tablets) 2 times a day thereafter  ADD Potassium 20 meq 2 times a day for 3 days total of 6 tablets then stop  CONTINUE Colchicine 0.6 ng daily for 2 weeks for gout in right foot/ankle   If you need a refill on your cardiac medications before your next appointment, please call your pharmacy.   Lab work: You will need to have labs (blood work) drawn in 1 week:  BMET  If you have labs (blood work) drawn today and your tests are completely normal, you will receive your results only by: Marland Kitchen MyChart Message (if you have MyChart) OR . A paper copy in the mail If you have any lab test that is abnormal or we need to change your treatment, we will call you to review the results.  Testing/Procedures: NONE ordered at this time of appointment   Follow-Up: At [facility], you and your health needs are our priority.  As part of our continuing mission to provide you with exceptional heart care, we have created designated Provider Care Teams.  These Care Teams include your primary Cardiologist (physician) and Advanced Practice Providers (APPs -  Physician Assistants and Nurse Practitioners) who all work together to provide you with the care you need, when you need it. . Your physician recommends that you keep your scheduled appointment with the Heart Failure Clinic    Any Other Special Instructions Will Be Listed Below (If Applicable).  Weigh self daily and record on weight log

## 2019-07-14 NOTE — Progress Notes (Signed)
Please remind him that the disability application is decided by a judge, not a physician. He may not get disability based on the data in his records. He needs to do much better with CPAP compliance and sodium restriction to avoid ending up back in the hospital.

## 2019-07-27 ENCOUNTER — Encounter (HOSPITAL_COMMUNITY): Payer: Self-pay

## 2019-07-27 ENCOUNTER — Ambulatory Visit (HOSPITAL_COMMUNITY)
Admission: RE | Admit: 2019-07-27 | Discharge: 2019-07-27 | Disposition: A | Discharge status: Home / Self Care | Payer: Self-pay | Source: Ambulatory Visit | Attending: Cardiology | Admitting: Cardiology

## 2019-07-27 ENCOUNTER — Other Ambulatory Visit: Payer: Self-pay

## 2019-07-27 VITALS — BP 134/86 | HR 106 | Wt 375.0 lb

## 2019-07-27 DIAGNOSIS — J45909 Unspecified asthma, uncomplicated: Secondary | ICD-10-CM | POA: Insufficient documentation

## 2019-07-27 DIAGNOSIS — E119 Type 2 diabetes mellitus without complications: Secondary | ICD-10-CM | POA: Insufficient documentation

## 2019-07-27 DIAGNOSIS — I110 Hypertensive heart disease with heart failure: Secondary | ICD-10-CM

## 2019-07-27 DIAGNOSIS — Z8249 Family history of ischemic heart disease and other diseases of the circulatory system: Secondary | ICD-10-CM | POA: Insufficient documentation

## 2019-07-27 DIAGNOSIS — I428 Other cardiomyopathies: Secondary | ICD-10-CM | POA: Insufficient documentation

## 2019-07-27 DIAGNOSIS — Z6841 Body Mass Index (BMI) 40.0 and over, adult: Secondary | ICD-10-CM | POA: Insufficient documentation

## 2019-07-27 DIAGNOSIS — Z79899 Other long term (current) drug therapy: Secondary | ICD-10-CM | POA: Insufficient documentation

## 2019-07-27 DIAGNOSIS — Z560 Unemployment, unspecified: Secondary | ICD-10-CM | POA: Insufficient documentation

## 2019-07-27 DIAGNOSIS — I2781 Cor pulmonale (chronic): Secondary | ICD-10-CM | POA: Insufficient documentation

## 2019-07-27 DIAGNOSIS — F17200 Nicotine dependence, unspecified, uncomplicated: Secondary | ICD-10-CM

## 2019-07-27 DIAGNOSIS — I5042 Chronic combined systolic (congestive) and diastolic (congestive) heart failure: Secondary | ICD-10-CM

## 2019-07-27 DIAGNOSIS — I5082 Biventricular heart failure: Secondary | ICD-10-CM | POA: Insufficient documentation

## 2019-07-27 DIAGNOSIS — F17210 Nicotine dependence, cigarettes, uncomplicated: Secondary | ICD-10-CM | POA: Insufficient documentation

## 2019-07-27 DIAGNOSIS — Z7982 Long term (current) use of aspirin: Secondary | ICD-10-CM | POA: Insufficient documentation

## 2019-07-27 DIAGNOSIS — G4733 Obstructive sleep apnea (adult) (pediatric): Secondary | ICD-10-CM

## 2019-07-27 DIAGNOSIS — I2721 Secondary pulmonary arterial hypertension: Secondary | ICD-10-CM | POA: Insufficient documentation

## 2019-07-27 NOTE — Addendum Note (Signed)
Encounter addended by: Marlise Eves, RN on: 07/27/2019 11:52 AM  Actions taken: Clinical Note Signed

## 2019-07-27 NOTE — Patient Instructions (Signed)
You have been referred to our heart failure social worker.   Please follow up with Dr. Theodosia Blender office for Obstructive Sleep Apnea.  Please follow up with the [facility] in 6 week.   At the [facility], you and your health needs are our priority. As part of our continuing mission to provide you with exceptional heart care, we have created designated Provider Care Teams. These Care Teams include your primary Cardiologist (physician) and Advanced Practice Providers (APPs- Physician Assistants and Nurse Practitioners) who all work together to provide you with the care you need, when you need it.   You may see any of the following providers on your designated Care Team at your next follow up: Marland Kitchen Dr Glori Bickers . Dr Loralie Champagne . Darrick Grinder, NP   Please be sure to bring in all your medications bottles to every appointment.

## 2019-07-27 NOTE — Progress Notes (Signed)
PCP: Jeanine Luz NP Primary Cardiologist: Dr Royann Shivers HF MD: Dr Gala Romney  Sleep Apnea: Dr Mendel Corning.   HPI: Roger Debates Smithis a 38 y.o.malewith super morbid obesity, malignant hypertension, obstructive sleep apnea, biventricular failure, moderate pulmonary artery hypertension (PASP 45 mmHg) due to cor pulmonale.   LV EF has been known to be low since 3/16, echo showed E 40% at that point.  Last echo in 11/18 showed EF 35-40%, moderately decreased RV systolic function, and D-shaped interventricular septum suggestive of RV pressure/volume overload.  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  Admitted in 6/13 /20 with increased shortness of breath. Diuresed with IV lasix and transitioned to torsemide 40 mg twice a day. Also underwent RHC/LHC that showed normal coronaries, normal RV, and elelvated filling pressures.  Overall diuresed ~ 30 pounds. Discharge weight 363 pounds.   Today he returns for HF follow up. Just lost his job because he fell asleep at work. Using CPAP intermittently 3-4 days a week.  Overall feeling fine. SOB with exertion. Denies PND/Orthopnea. Appetite ok. No fever or chills. Weight at home 370 pounds. Taking all medications. Smoking 1/2 PPD. Lives with his wife.    Cardiac Testing RHC/LHC  06/01/2019 Ao = 123/88 (104)  LV =  118/23 RA =  22 RV = 58/25 PA = 56/20 (38) PCW = 14 Fick cardiac output/index = 10.3/3.7 PVR = 2.3 Ao sat = 90% PA sat = 72%, 69% Assessment: 1. Normal coronary arteries  2. NICM EF 45% 3. Mild to moderate PAH with RV strain 05/30/2019 Echo with EF 40-45%. RV appears normal. There is septal flattening suggestive of RV pressure volume/overload. Personally reviewed  ROS: All systems negative except as listed in HPI, PMH and Problem List.  SH:  Social History   Socioeconomic History  . Marital status: Married    Spouse name: Not on file  . Number of children: 0  . Years of education: 57  .  Highest education level: Not on file  Occupational History  . Occupation: Merchandiser, retail at [employer]  . Financial resource strain: Not on file  . Food insecurity    Worry: Not on file    Inability: Not on file  . Transportation needs    Medical: Not on file    Non-medical: Not on file  Tobacco Use  . Smoking status: Current Every Day Smoker    Packs/day: 0.50    Years: 19.00    Pack years: 9.50    Types: Cigarettes    Start date: 01/05/1996    Last attempt to quit: 03/04/2015    Years since quitting: 4.4  . Smokeless tobacco: Never Used  . Tobacco comment: Already decreasaed smoking  Substance and Sexual Activity  . Alcohol use: No    Alcohol/week: 1.0 - 2.0 standard drinks    Types: 1 - 2 Standard drinks or equivalent per week    Comment: Occasional  . Drug use: No  . Sexual activity: Yes    Birth control/protection: Condom  Lifestyle  . Physical activity    Days per week: Not on file    Minutes per session: Not on file  . Stress: Not on file  Relationships  . Social Musician on phone: Not on file    Gets together: Not on file    Attends religious service: Not on file    Active member of club or organization: Not on  file    Attends meetings of clubs or organizations: Not on file    Relationship status: Not on file  . Intimate partner violence    Fear of current or ex partner: Not on file    Emotionally abused: Not on file    Physically abused: Not on file    Forced sexual activity: Not on file  Other Topics Concern  . Not on file  Social History Narrative   [place]. Fun: Bowling, movies, we go   Denies religious beliefs that would effect health care.     FH:  Family History  Problem Relation Age of Onset  . Hypertension Mother   . Hyperlipidemia Mother   . Heart failure Mother   . Multiple sclerosis Mother   . Breast cancer Maternal Grandmother   . Diabetes Maternal Grandmother   . Colon cancer  Maternal Uncle     Past Medical History:  Diagnosis Date  . 2nd degree AV block    a. noted during 02/2015 admission - second degree AV AV block (not further defined in notes). Not on BB due to this.  . Asthma    Childhood  . Bradycardia    a. noted during 02/2015 admission - second degree AV AV block (not further defined in notes). Not on BB due to this.  . Chronic combined systolic and diastolic CHF (congestive heart failure) (HCC)    a. echo 03/06/15 showing mild LVH, EF 40%, high ventricular filling pressures, mod MR, mildly dilated RV/mildly reduced RV function, mildly dilated RA, PASP 45mmHg.  . Diabetes mellitus, type 2 (HCC)   . Essential hypertension   . Family history of adverse reaction to anesthesia    mother and aunt  both put to sleep for surgery and "stopped breathing"  . Morbid obesity (HCC)   . NSVT (nonsustained ventricular tachycardia) (HCC)    a. noted during 02/2015 admission.  . OSA (obstructive sleep apnea)    a. noncompliance with CPAP - wakes up with it off.    Current Outpatient Medications  Medication Sig Dispense Refill  . aspirin EC 81 MG EC tablet Take 1 tablet (81 mg total) by mouth daily. 30 tablet 0  . carvedilol (COREG) 12.5 MG tablet Take 1 tablet (12.5 mg total) by mouth 2 (two) times daily with a meal. 180 tablet 0  . furosemide (LASIX) 80 MG tablet Take 80 mg by mouth.    . isosorbide-hydrALAZINE (BIDIL) 20-37.5 MG tablet Take 1 tablet by mouth 3 (three) times daily. 90 tablet 0  . metolazone (ZAROXOLYN) 2.5 MG tablet Take 1 tablet (2.5 mg total) by mouth daily for 2 days. 2 tablet 0  . nicotine (NICODERM CQ - DOSED IN MG/24 HOURS) 21 mg/24hr patch Place 1 patch (21 mg total) onto the skin daily. 28 patch 0  . potassium chloride SA (K-DUR) 20 MEQ tablet Take 1 tablet (20 mEq total) by mouth 2 (two) times daily for 3 days. 6 tablet 0  . predniSONE (DELTASONE) 20 MG tablet Take 20 mg daily for 3 days (Patient not taking: Reported on 07/13/2019) 3 tablet  0  . sacubitril-valsartan (ENTRESTO) 97-103 MG Take 1 tablet by mouth 2 (two) times daily. 60 tablet 0  . spironolactone (ALDACTONE) 50 MG tablet Take 1 tablet (50 mg total) by mouth daily. 90 tablet 1  . torsemide (DEMADEX) 20 MG tablet Take 2 tablets (40 mg total) by mouth 2 (two) times daily. 360 tablet 0   No current facility-administered  medications for this encounter.     Vitals:   07/27/19 1116  BP: 134/86  Pulse: (!) 106  SpO2: 90%  Weight: (!) 170.1 kg (375 lb)   Wt Readings from Last 3 Encounters:  07/27/19 (!) 170.1 kg (375 lb)  07/13/19 (!) 173.3 kg (382 lb)  06/15/19 (!) 163.1 kg (359 lb 9.6 oz)     PHYSICAL EXAM: General:  No resp difficulty HEENT: normal Neck: supple. JVD 5-6 . Carotids 2+ bilat; no bruits. No lymphadenopathy or thryomegaly appreciated. Cor: PMI nondisplaced. Regular rate & rhythm. No rubs, gallops or murmurs. Lungs: clear Abdomen: obese, soft, nontender, nondistended. No hepatosplenomegaly. No bruits or masses. Good bowel sounds. Extremities: no cyanosis, clubbing, rash, trace edema Neuro: alert & orientedx3, cranial nerves grossly intact. moves all 4 extremities w/o difficulty. Affect pleasant  EKG: SR with occasional PVCs 92 bpm    ASSESSMENT & PLAN: 1. Chronic systolic CHF with prominent RV dysfunction: Echo in 2018 showed EF 35-40%, diffuse hypokinesis, D-shaped interventricular septum, moderately dysfunctional RV. Suspect cor pulmonale. Cause of RV failure uncertain, he does have significant OSA. However, no evidence so far for OHS (oxygen saturation ok on room air during day so far). - - Echo 05/30/19 with EF 40-45%. RV appears normal. There is septal flattening suggestive of RV pressure volume/overload. -- R/L cath 6/15 . Normal cors.  -NYHA III.  Volume status stable. Continue torsemide 40 mg twice a day.  - Continue Entresto 97/103 bid,  - Continue spironolactone 50 daily,  Bidil 1 tab tid, Coreg 12.5 mg bid.  2. OSA/?OHS:  Continue nightly CPAP. Followed by Dr Radford Pax. He has not seen her in 2 years. We need to send him back to her.  3. 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 Maintaining NSR.  4. HTN: stable.  5. Morbid obesity Body mass index is 49.48 kg/m. Needs to lose weight. We discussed portion cotrol. .  6. Tobacco Abuse Discussed smoking cessation.  He has lost his insurance. For now he has all of his medications and he will try to get disability.    Follow up in 6 weeks. I referred him to HFSW for assistance with disability.   Kayal Mula NP-C  11:17 AM

## 2019-07-28 NOTE — Progress Notes (Signed)
CSW referred to provide information regarding disability benefits. CSW discussed Social Security Disability and reviewed general information packet about the process. Patient verbalizes understanding and will follow up with information provided. .CSW continues to follow and be available as needed. Raquel Sarna, [place], [place]

## 2019-07-28 NOTE — Addendum Note (Signed)
Encounter addended by: Louann Liv, LCSW on: 07/28/2019 3:20 PM  Actions taken: Clinical Note Signed

## 2019-08-15 IMAGING — CR PORTABLE CHEST - 1 VIEW
1 series · 1 of 1 positions shown · non-contrast
Comparison: 10/21/2017

CLINICAL DATA: Swelling. Shortness of breath today. History of CHF
and hypertension.

EXAM:
PORTABLE CHEST 1 VIEW

[AP]
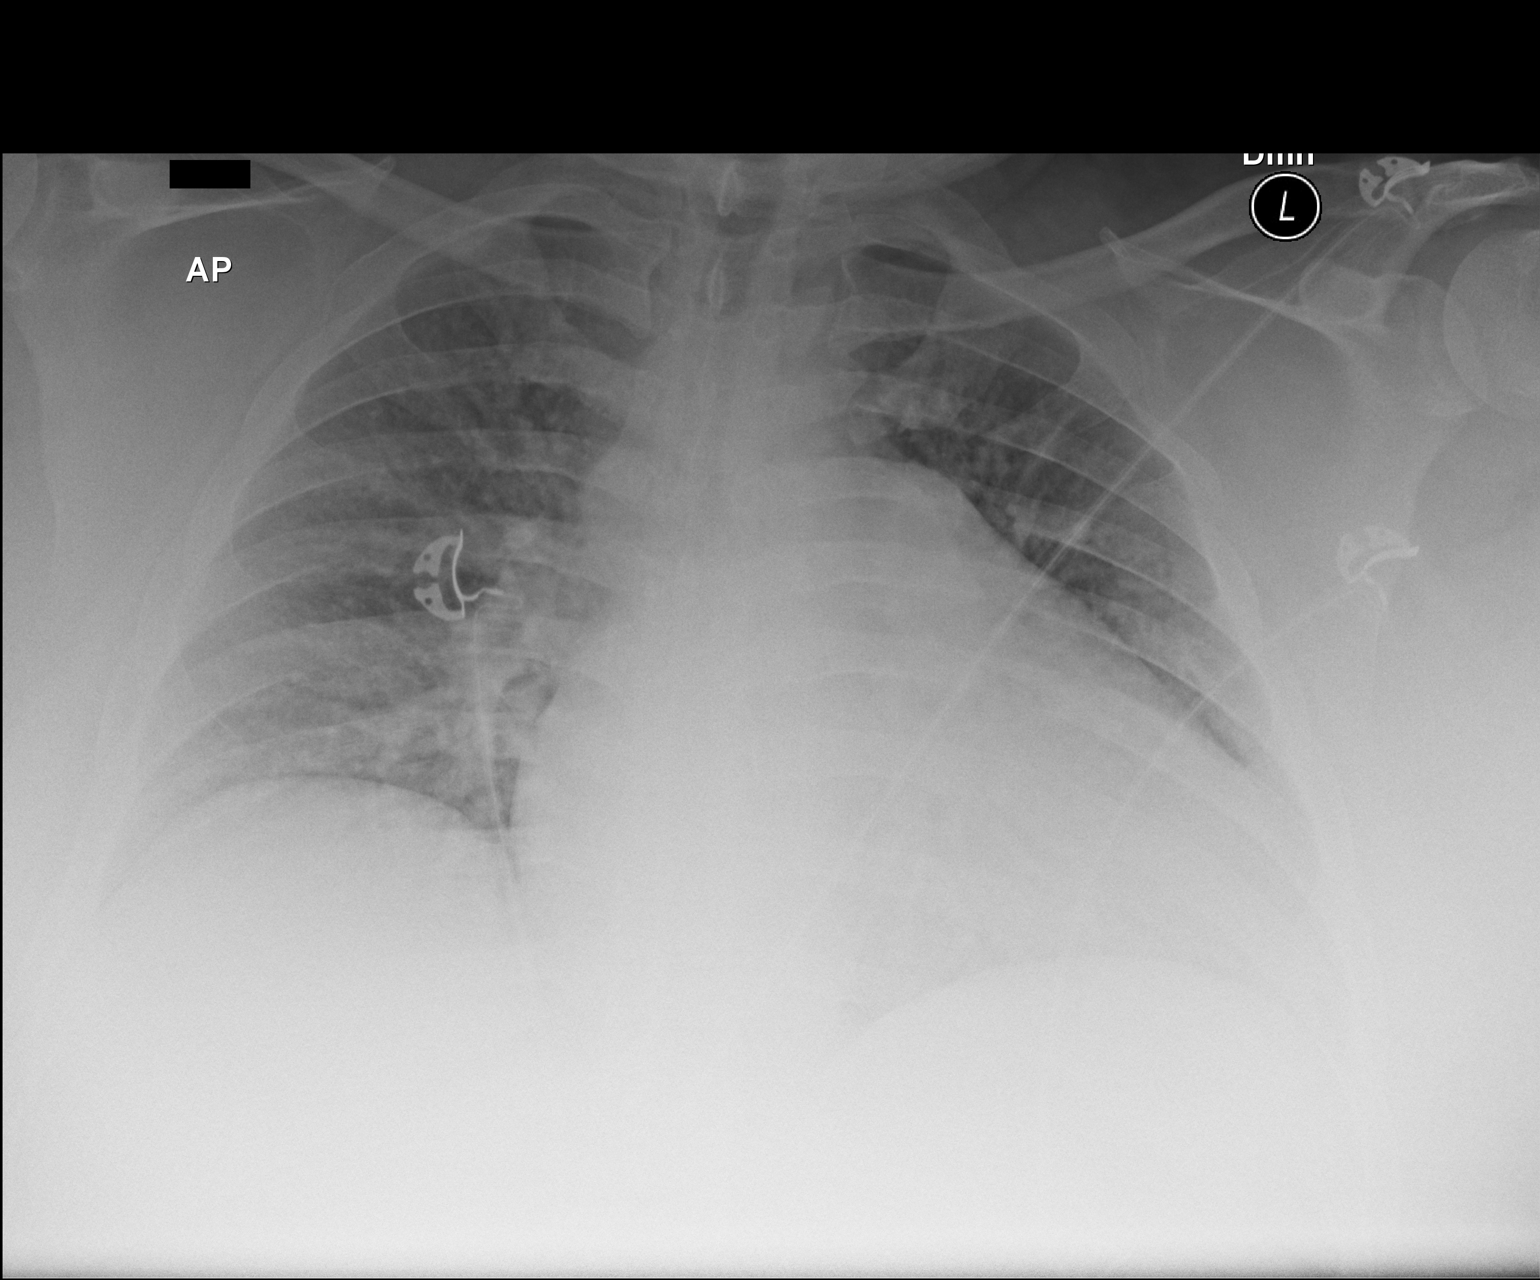

[1 of 1 positions shown; findings below may reference images not displayed]

FINDINGS: Mild enlargement of the cardiopericardial silhouette, stable. No
mediastinal or hilar masses. No convincing adenopathy.

Lungs are clear.  No pleural effusion or pneumothorax.

Skeletal structures are grossly intact.
IMPRESSION: 1. No acute cardiopulmonary disease.
2. Stable cardiomegaly.

## 2019-08-16 IMAGING — NM NM PULMONARY PERFUSION PARTICULATE
8 series · 8 of 8 positions shown · non-contrast
Comparison: Chest radiograph 05/29/2011

CLINICAL DATA: Short of breath. Negative D-dimer. Congestive heart
failure

EXAM:
NUCLEAR MEDICINE PERFUSION LUNG SCAN
TECHNIQUE: Perfusion images were obtained in multiple projections after
intravenous injection of radiopharmaceutical.
RADIOPHARMACEUTICALS:  1.5 mCi Lc-XXm MAA

[Series 1: ant/post perf · 4.14mm/px · 1 of 1 slices shown (1 of 2)]
[im 1/1]
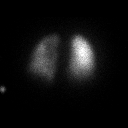

[Series 1: ant/post perf · 4.14mm/px · 1 of 1 slices shown (2 of 2)]
[im 1/1]
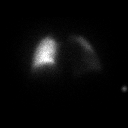

[Series 2: lao/rpo perf · 4.14mm/px · 1 of 1 slices shown (1 of 2)]
[im 1/1]
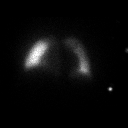

[Series 2: lao/rpo perf · 4.14mm/px · 1 of 1 slices shown (2 of 2)]
[im 1/1]
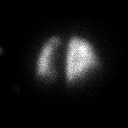

[Series 3: lpo/rao perf · 4.14mm/px · 1 of 1 slices shown (1 of 2)]
[im 1/1]
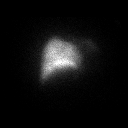

[Series 3: lpo/rao perf · 4.14mm/px · 1 of 1 slices shown (2 of 2)]
[im 1/1]
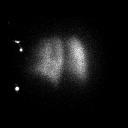

[Series 4: lt lat/rt lat perf · 4.14mm/px · 1 of 1 slices shown (1 of 2)]
[im 1/1]
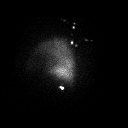

[Series 4: lt lat/rt lat perf · 4.14mm/px · 1 of 1 slices shown (2 of 2)]
[im 1/1]
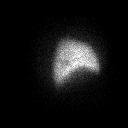

[8 of 8 positions shown; findings below may reference images not displayed]

FINDINGS: No wedge-shaped peripheral perfusion defects to localized acute
pulmonary embolism. There is decreased relative perfusion to the
LEFT lung related to the attenuation from the enlarged cardiac
silhouette.
IMPRESSION: 1. No evidence of acute of pulmonary embolism.
2. Marked cardiomegaly.

## 2019-08-17 ENCOUNTER — Other Ambulatory Visit: Payer: Self-pay | Admitting: Cardiovascular Disease

## 2019-08-18 NOTE — Telephone Encounter (Signed)
Please advise if OK to refill. Last filled by Carole Hall, DO. Thank you! 

## 2019-08-31 ENCOUNTER — Other Ambulatory Visit: Payer: Self-pay

## 2019-08-31 ENCOUNTER — Telehealth: Payer: Self-pay | Admitting: Cardiovascular Disease

## 2019-08-31 MED ORDER — ENTRESTO 97-103 MG PO TABS: 1.0000 | ORAL_TABLET | Freq: Two times a day (BID) | ORAL | 0 refills | Status: AC

## 2019-08-31 NOTE — Telephone Encounter (Signed)
New Message    *STAT* If patient is at the pharmacy, call can be transferred to refill team.   1. Which medications need to be refilled? (please list name of each medication and dose if known) cubitril-valsartan (ENTRESTO) 97-103 MG  2. Which pharmacy/location (including street and city if local pharmacy) is medication to be sent to? [id number] - [place], [place] - [address]  3. Do they need a 30 day or 90 day supply? [place]

## 2019-08-31 NOTE — Telephone Encounter (Signed)
Patient called again, he is currently out of the medication.

## 2019-09-03 ENCOUNTER — Telehealth: Payer: Self-pay | Admitting: Cardiovascular Disease

## 2019-09-03 NOTE — Telephone Encounter (Signed)
Can you please check since I am not on site to see if any samples are available?

## 2019-09-03 NOTE — Telephone Encounter (Signed)
Patient calling the office for samples of medication:   1.  What medication and dosage are you requesting samples for? Entresto  2.  Are you currently out of this medication?  Yes, pt has no job, no insurance and no money to get this medicineno

## 2019-09-04 NOTE — Telephone Encounter (Signed)
Left a message for the patient to call back to see if he would like to try for assistance for the [facility]

## 2019-09-04 NOTE — Telephone Encounter (Signed)
Follow UP;   Pt was calling to see if we had any samples of Eliquis. He have not had any for 2 weeks.

## 2019-09-04 NOTE — Telephone Encounter (Signed)
Follow Up:    Pt  meant Entresto.  We did not have any samples of Entresto.

## 2019-09-06 NOTE — Progress Notes (Signed)
PCP: Jeanine Luz NP Primary Cardiologist: Dr Royann Shivers HF MD: Dr Gala Romney  Sleep Apnea: Dr Mendel Corning.   HPI: Roger Banter Smithis a 37 y.o.malewith super morbid obesity, malignant hypertension, obstructive sleep apnea, biventricular failure, moderate pulmonary artery hypertension (PASP 45 mmHg) due to cor pulmonale.   LV EF has been known to be low since 3/16, echo showed E 40% at that point.  Last echo in 11/18 showed EF 35-40%, moderately decreased RV systolic function, and D-shaped interventricular septum suggestive of RV pressure/volume overload.  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  Admitted in 6/13 /20 with increased shortness of breath. Diuresed with IV lasix and transitioned to torsemide 40 mg twice a day. Also underwent RHC/LHC that showed normal coronaries, normal RV, and elelvated filling pressures.  Overall diuresed ~ 30 pounds. Discharge weight 363 pounds.   Today he returns for HF follow up with his wife. Overall feeling fair. Says his mom passed away a few weeks ago and has been having a hard time dealing with it. SOB with exertion. Denies PND/Orthopnea.  Appetite ok. No fever or chills. He has not been weighing. Using CPAP 3-4. Taking all medications but entresto. He ran out of entresto because he lost his insurance.  Smoking 1/2 PPD   Cardiac Testing RHC/LHC  06/01/2019 Ao = 123/88 (104)  LV =  118/23 RA =  22 RV = 58/25 PA = 56/20 (38) PCW = 14 Fick cardiac output/index = 10.3/3.7 PVR = 2.3 Ao sat = 90% PA sat = 72%, 69% Assessment: 1. Normal coronary arteries  2. NICM EF 45% 3. Mild to moderate PAH with RV strain 05/30/2019 Echo with EF 40-45%. RV appears normal. There is septal flattening suggestive of RV pressure volume/overload. Personally reviewed  ROS: All systems negative except as listed in HPI, PMH and Problem List.  SH:  Social History   Socioeconomic History  . Marital status: Married    Spouse name: Not  on file  . Number of children: 0  . Years of education: 57  . Highest education level: Not on file  Occupational History  . Occupation: Merchandiser, retail at [employer]  . Financial resource strain: Not on file  . Food insecurity    Worry: Not on file    Inability: Not on file  . Transportation needs    Medical: Not on file    Non-medical: Not on file  Tobacco Use  . Smoking status: Current Every Day Smoker    Packs/day: 0.50    Years: 19.00    Pack years: 9.50    Types: Cigarettes    Start date: 01/05/1996    Last attempt to quit: 03/04/2015    Years since quitting: 4.5  . Smokeless tobacco: Never Used  . Tobacco comment: Already decreasaed smoking  Substance and Sexual Activity  . Alcohol use: No    Alcohol/week: 1.0 - 2.0 standard drinks    Types: 1 - 2 Standard drinks or equivalent per week    Comment: Occasional  . Drug use: No  . Sexual activity: Yes    Birth control/protection: Condom  Lifestyle  . Physical activity    Days per week: Not on file    Minutes per session: Not on file  . Stress: Not on file  Relationships  . Social Musician on phone: Not on file    Gets together: Not on file    Attends  religious service: Not on file    Active member of club or organization: Not on file    Attends meetings of clubs or organizations: Not on file    Relationship status: Not on file  . Intimate partner violence    Fear of current or ex partner: Not on file    Emotionally abused: Not on file    Physically abused: Not on file    Forced sexual activity: Not on file  Other Topics Concern  . Not on file  Social History Narrative   [place]. Fun: Bowling, movies, we go   Denies religious beliefs that would effect health care.     FH:  Family History  Problem Relation Age of Onset  . Hypertension Mother   . Hyperlipidemia Mother   . Heart failure Mother   . Multiple sclerosis Mother   . Breast cancer Maternal  Grandmother   . Diabetes Maternal Grandmother   . Colon cancer Maternal Uncle     Past Medical History:  Diagnosis Date  . 2nd degree AV block    a. noted during 02/2015 admission - second degree AV AV block (not further defined in notes). Not on BB due to this.  . Asthma    Childhood  . Bradycardia    a. noted during 02/2015 admission - second degree AV AV block (not further defined in notes). Not on BB due to this.  . Chronic combined systolic and diastolic CHF (congestive heart failure) (HCC)    a. echo 03/06/15 showing mild LVH, EF 40%, high ventricular filling pressures, mod MR, mildly dilated RV/mildly reduced RV function, mildly dilated RA, PASP 45mmHg.  . Diabetes mellitus, type 2 (HCC)   . Essential hypertension   . Family history of adverse reaction to anesthesia    mother and aunt  both put to sleep for surgery and "stopped breathing"  . Morbid obesity (HCC)   . NSVT (nonsustained ventricular tachycardia) (HCC)    a. noted during 02/2015 admission.  . OSA (obstructive sleep apnea)    a. noncompliance with CPAP - wakes up with it off.    Current Outpatient Medications  Medication Sig Dispense Refill  . BIDIL 20-37.5 MG tablet TAKE 1 TABLET BY MOUTH THREE TIMES A DAY 270 tablet 1  . carvedilol (COREG) 12.5 MG tablet Take 1 tablet (12.5 mg total) by mouth 2 (two) times daily with a meal. 180 tablet 0  . sacubitril-valsartan (ENTRESTO) 97-103 MG Take 1 tablet by mouth 2 (two) times daily. 60 tablet 0  . spironolactone (ALDACTONE) 50 MG tablet Take 1 tablet (50 mg total) by mouth daily. 90 tablet 1  . torsemide (DEMADEX) 20 MG tablet Take 2 tablets (40 mg total) by mouth 2 (two) times daily. 360 tablet 0   No current facility-administered medications for this encounter.     Vitals:   09/07/19 1047  BP: 132/75  Pulse: 89  SpO2: 92%  Weight: (!) 172.3 kg (379 lb 12.8 oz)   Wt Readings from Last 3 Encounters:  09/07/19 (!) 172.3 kg (379 lb 12.8 oz)  07/27/19 (!) 170.1 kg  (375 lb)  07/13/19 (!) 173.3 kg (382 lb)     PHYSICAL EXAM: General:  Well appearing. No resp difficulty HEENT: normal Neck: supple. JVD 6-7 . Carotids 2+ bilat; no bruits. No lymphadenopathy or thryomegaly appreciated. Cor: PMI nondisplaced. Regular rate & rhythm. No rubs, gallops or murmurs. Lungs: clear Abdomen: soft, nontender, nondistended. No hepatosplenomegaly. No bruits or masses. Good  bowel sounds. Extremities: no cyanosis, clubbing, rash,RLE and LLE trace-1+  edema Neuro: alert & orientedx3, cranial nerves grossly intact. moves all 4 extremities w/o difficulty. Affect pleasant    ASSESSMENT & PLAN: 1. Chronic systolic CHF with prominent RV dysfunction: Echo in 2018 showed EF 35-40%, diffuse hypokinesis, D-shaped interventricular septum, moderately dysfunctional RV. Suspect cor pulmonale. Cause of RV failure uncertain, he does have significant OSA. However, no evidence so far for OHS (oxygen saturation ok on room air during day so far). - - Echo 05/30/19 with EF 40-45%. RV appears normal. There is septal flattening suggestive of RV pressure volume/overload. -- R/L cath 6/15 . Normal cors.  NYHA IIIb.  - Continue torsemide 40 mg twice a day.  - Continue Entresto 97/103 bid, will try to give samples of entresto today. We will set up with patient assistance  - Continue spironolactone 50 daily, -Continue  Bidil 1 tab tid - Continue Coreg 12.5 mg bid.  - Check BMET today.  2. OSA/?OHS: Reinforced daily CPAP use.  Followed by Dr Radford Pax. He has not seen her in 2 years. We need to send him back to her.  3. 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 Maintaining NSR.  4. HTN: stable.  5. Morbid obesity Body mass index is 50.11 kg/m.  Discussed portion control.  6. Tobacco Abuse Discussed smoking cessation.    .  Refer to HFSW/ Pharamacy Tech. Needs assistance with medications and insurance. Will need to see if he qualifies for food stamps.   Follow up  in 4 weeks.     NP-C  11:13 AM

## 2019-09-07 ENCOUNTER — Encounter (HOSPITAL_COMMUNITY): Payer: Self-pay

## 2019-09-07 ENCOUNTER — Telehealth (HOSPITAL_COMMUNITY): Payer: Self-pay | Admitting: Pharmacy Technician

## 2019-09-07 ENCOUNTER — Other Ambulatory Visit: Payer: Self-pay

## 2019-09-07 ENCOUNTER — Ambulatory Visit (HOSPITAL_COMMUNITY)
Admission: RE | Admit: 2019-09-07 | Discharge: 2019-09-07 | Disposition: A | Discharge status: Home / Self Care | Payer: Self-pay | Source: Ambulatory Visit | Attending: Internal Medicine | Admitting: Internal Medicine

## 2019-09-07 VITALS — BP 132/75 | HR 89 | Wt 379.8 lb

## 2019-09-07 DIAGNOSIS — F17210 Nicotine dependence, cigarettes, uncomplicated: Secondary | ICD-10-CM | POA: Insufficient documentation

## 2019-09-07 DIAGNOSIS — E119 Type 2 diabetes mellitus without complications: Secondary | ICD-10-CM | POA: Insufficient documentation

## 2019-09-07 DIAGNOSIS — I110 Hypertensive heart disease with heart failure: Secondary | ICD-10-CM | POA: Insufficient documentation

## 2019-09-07 DIAGNOSIS — I5082 Biventricular heart failure: Secondary | ICD-10-CM | POA: Insufficient documentation

## 2019-09-07 DIAGNOSIS — I509 Heart failure, unspecified: Secondary | ICD-10-CM | POA: Insufficient documentation

## 2019-09-07 DIAGNOSIS — I428 Other cardiomyopathies: Secondary | ICD-10-CM | POA: Insufficient documentation

## 2019-09-07 DIAGNOSIS — J45909 Unspecified asthma, uncomplicated: Secondary | ICD-10-CM | POA: Insufficient documentation

## 2019-09-07 DIAGNOSIS — I5042 Chronic combined systolic (congestive) and diastolic (congestive) heart failure: Secondary | ICD-10-CM | POA: Insufficient documentation

## 2019-09-07 DIAGNOSIS — Z6841 Body Mass Index (BMI) 40.0 and over, adult: Secondary | ICD-10-CM | POA: Insufficient documentation

## 2019-09-07 DIAGNOSIS — Z79899 Other long term (current) drug therapy: Secondary | ICD-10-CM | POA: Insufficient documentation

## 2019-09-07 DIAGNOSIS — I2781 Cor pulmonale (chronic): Secondary | ICD-10-CM | POA: Insufficient documentation

## 2019-09-07 DIAGNOSIS — G4733 Obstructive sleep apnea (adult) (pediatric): Secondary | ICD-10-CM | POA: Insufficient documentation

## 2019-09-07 DIAGNOSIS — I2721 Secondary pulmonary arterial hypertension: Secondary | ICD-10-CM | POA: Insufficient documentation

## 2019-09-07 DIAGNOSIS — F17200 Nicotine dependence, unspecified, uncomplicated: Secondary | ICD-10-CM

## 2019-09-07 DIAGNOSIS — Z8249 Family history of ischemic heart disease and other diseases of the circulatory system: Secondary | ICD-10-CM | POA: Insufficient documentation

## 2019-09-07 LAB — BASIC METABOLIC PANEL
Anion gap: 13 (ref 5–15)
BUN: 10 mg/dL (ref 6–20)
CO2: 33 mmol/L — ABNORMAL HIGH (ref 22–32)
Calcium: 8.6 mg/dL — ABNORMAL LOW (ref 8.9–10.3)
Chloride: 96 mmol/L — ABNORMAL LOW (ref 98–111)
Creatinine, Ser: 0.91 mg/dL (ref 0.61–1.24)
GFR calc Af Amer: >60 mL/min (ref >60)
GFR calc non Af Amer: >60 mL/min (ref >60)
Glucose, Bld: 100 mg/dL — ABNORMAL HIGH (ref 70–99)
Potassium: 3.7 mmol/L (ref 3.5–5.1)
Sodium: 142 mmol/L (ref 135–145)

## 2019-09-07 NOTE — Telephone Encounter (Signed)
Left a message for the patient to call back.  

## 2019-09-07 NOTE — Telephone Encounter (Signed)
Met patient in clinic today to start [facility] patient assistance application. Will get provider portion filled out and fax application to [employer].  Phone #: [phone]  Fax #: [phone]  Will follow up.  Charlann Boxer, CPhT

## 2019-09-07 NOTE — Telephone Encounter (Signed)
Patient is returning call.  °

## 2019-09-07 NOTE — Patient Instructions (Signed)
It was great to see you today! No medication changes are needed at this time.   Your physician recommends that you schedule a follow-up appointment in: 4 weeks with Amy Clegg,NP  Do the following things EVERYDAY: 1) Weigh yourself in the morning before breakfast. Write it down and keep it in a log. 2) Take your medicines as prescribed 3) Eat low salt foods-Limit salt (sodium) to 2000 mg per day.  4) Stay as active as you can everyday 5) Limit all fluids for the day to less than 2 liters   At the [facility], you and your health needs are our priority. As part of our continuing mission to provide you with exceptional heart care, we have created designated Provider Care Teams. These Care Teams include your primary Cardiologist (physician) and Advanced Practice Providers (APPs- Physician Assistants and Nurse Practitioners) who all work together to provide you with the care you need, when you need it.   You may see any of the following providers on your designated Care Team at your next follow up: . Dr Daniel Bensimhon . Dr Dalton McLean . Amy Clegg, NP   Please be sure to bring in all your medications bottles to every appointment.     

## 2019-09-08 NOTE — Progress Notes (Signed)
CSW referred to assist patient with community resources for food and insurance. CSW discussed options for insurance and patient reports he has a pending disability application. CSW encouraged patient to follow up with local social services to inquire about a medicaid application as well as apply for food stamps. Patient is unable to work at the moment dur to his health and last worked in June. He is married and his wife works although they are tight with the budget due to his inability to work. CSW also provided food banks and shared about the [employer] to supplement their monthly food supply. Patient and wife appreciative of resources and verbalize understanding and follow up. CSW available as needed. Raquel Sarna, [place], [place]

## 2019-09-08 NOTE — Addendum Note (Signed)
Encounter addended by: Louann Liv, LCSW on: 09/08/2019 9:32 AM  Actions taken: Clinical Note Signed

## 2019-09-09 NOTE — Telephone Encounter (Signed)
Completed application and faxed it in.  Will continue to follow up.  Charlann Boxer, CPhT

## 2019-09-11 ENCOUNTER — Encounter (HOSPITAL_COMMUNITY): Payer: Managed Care, Other (non HMO) | Admitting: Cardiology

## 2019-09-14 NOTE — Telephone Encounter (Signed)
Patient was approved to receive Entresto through [employer].  ID: [id number]  Effective Dates: 09/11/2019 through 09/10/2020  Called patient to give him approval with phone number to [employer]. Had to leave a message. Advised patient to call me with any issues.  Charlann Boxer, CPhT

## 2019-09-18 NOTE — Telephone Encounter (Signed)
Left a message for the patient to call back.  

## 2019-09-18 NOTE — Telephone Encounter (Signed)
Patient has received assistance for Entresto according to epic encounter after Dr. Haroldine Laws appointment. This will encounter will be closed.

## 2019-10-04 ENCOUNTER — Other Ambulatory Visit: Payer: Self-pay | Admitting: Physician Assistant

## 2019-10-05 ENCOUNTER — Ambulatory Visit (HOSPITAL_COMMUNITY)
Admission: RE | Admit: 2019-10-05 | Discharge: 2019-10-05 | Disposition: A | Discharge status: Home / Self Care | Payer: Self-pay | Source: Ambulatory Visit | Attending: Adult Health | Admitting: Adult Health

## 2019-10-05 ENCOUNTER — Other Ambulatory Visit: Payer: Self-pay

## 2019-10-05 VITALS — BP 150/92 | HR 97 | Wt 375.2 lb

## 2019-10-05 DIAGNOSIS — I441 Atrioventricular block, second degree: Secondary | ICD-10-CM | POA: Insufficient documentation

## 2019-10-05 DIAGNOSIS — Z8249 Family history of ischemic heart disease and other diseases of the circulatory system: Secondary | ICD-10-CM | POA: Insufficient documentation

## 2019-10-05 DIAGNOSIS — F17200 Nicotine dependence, unspecified, uncomplicated: Secondary | ICD-10-CM

## 2019-10-05 DIAGNOSIS — Z803 Family history of malignant neoplasm of breast: Secondary | ICD-10-CM | POA: Insufficient documentation

## 2019-10-05 DIAGNOSIS — Z6841 Body Mass Index (BMI) 40.0 and over, adult: Secondary | ICD-10-CM

## 2019-10-05 DIAGNOSIS — I428 Other cardiomyopathies: Secondary | ICD-10-CM | POA: Insufficient documentation

## 2019-10-05 DIAGNOSIS — Z79899 Other long term (current) drug therapy: Secondary | ICD-10-CM | POA: Insufficient documentation

## 2019-10-05 DIAGNOSIS — Z800 Family history of malignant neoplasm of digestive organs: Secondary | ICD-10-CM | POA: Insufficient documentation

## 2019-10-05 DIAGNOSIS — Z9119 Patient's noncompliance with other medical treatment and regimen: Secondary | ICD-10-CM | POA: Insufficient documentation

## 2019-10-05 DIAGNOSIS — I2721 Secondary pulmonary arterial hypertension: Secondary | ICD-10-CM | POA: Insufficient documentation

## 2019-10-05 DIAGNOSIS — E119 Type 2 diabetes mellitus without complications: Secondary | ICD-10-CM | POA: Insufficient documentation

## 2019-10-05 DIAGNOSIS — F17210 Nicotine dependence, cigarettes, uncomplicated: Secondary | ICD-10-CM | POA: Insufficient documentation

## 2019-10-05 DIAGNOSIS — I472 Ventricular tachycardia: Secondary | ICD-10-CM | POA: Insufficient documentation

## 2019-10-05 DIAGNOSIS — I5042 Chronic combined systolic (congestive) and diastolic (congestive) heart failure: Secondary | ICD-10-CM | POA: Insufficient documentation

## 2019-10-05 DIAGNOSIS — I5082 Biventricular heart failure: Secondary | ICD-10-CM | POA: Insufficient documentation

## 2019-10-05 DIAGNOSIS — Z833 Family history of diabetes mellitus: Secondary | ICD-10-CM | POA: Insufficient documentation

## 2019-10-05 DIAGNOSIS — G4733 Obstructive sleep apnea (adult) (pediatric): Secondary | ICD-10-CM | POA: Insufficient documentation

## 2019-10-05 DIAGNOSIS — I110 Hypertensive heart disease with heart failure: Secondary | ICD-10-CM | POA: Insufficient documentation

## 2019-10-05 MED ORDER — CARVEDILOL 12.5 MG PO TABS: 18.7500 mg | ORAL_TABLET | Freq: Two times a day (BID) | ORAL | 3 refills | Status: AC

## 2019-10-05 NOTE — Patient Instructions (Signed)
INCREASE Carvedilol to 18.75mg  (1.5 tab) two times daily.  Please follow up with the [facility] in 2 months.  At the [facility], you and your health needs are our priority. As part of our continuing mission to provide you with exceptional heart care, we have created designated Provider Care Teams. These Care Teams include your primary Cardiologist (physician) and Advanced Practice Providers (APPs- Physician Assistants and Nurse Practitioners) who all work together to provide you with the care you need, when you need it.   You may see any of the following providers on your designated Care Team at your next follow up: Marland Kitchen Dr Glori Bickers . Dr Loralie Champagne . Darrick Grinder, NP . Lyda Jester, PA   Please be sure to bring in all your medications bottles to every appointment.

## 2019-10-05 NOTE — Progress Notes (Signed)
PCP: Mauricio Po NP Primary Cardiologist: Dr Sallyanne Kuster HF MD: Dr Haroldine Laws  Sleep Apnea: Dr Heron Nay.   HPI: Roger Grumbine Smithis a 38 y.o.malewith super morbid obesity, malignant hypertension, obstructive sleep apnea, biventricular failure, moderate pulmonary artery hypertension (PASP 45 mmHg) due to cor pulmonale.   LV EF has been known to be low since 3/16, echo showed E 40% at that point.  Last echo in 11/18 showed EF 35-40%, moderately decreased RV systolic function, and D-shaped interventricular septum suggestive of RV pressure/volume overload.  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  Admitted in 6/13 /20 with increased shortness of breath. Diuresed with IV lasix and transitioned to torsemide 40 mg twice a day. Also underwent RHC/LHC that showed normal coronaries, normal RV, and elelvated filling pressures.  Overall diuresed ~ 30 pounds. Discharge weight 363 pounds.   Today he returns for HF follow up.Overall feeling fine. Denies PND/Orthopnea. Mild SOB with steps.  Appetite ok. No fever or chills. Weight at home 370-378 pounds. Trying to walk more. Using CPAP every night.  Taking all medications. Receiving PT assistance for entresto. Smoking 1/2 PPD. Lives with his wife.    Cardiac Testing RHC/LHC  06/01/2019 Ao = 123/88 (104)  LV =  118/23 RA =  22 RV = 58/25 PA = 56/20 (38) PCW = 14 Fick cardiac output/index = 10.3/3.7 PVR = 2.3 Ao sat = 90% PA sat = 72%, 69% Assessment: 1. Normal coronary arteries  2. NICM EF 45% 3. Mild to moderate PAH with RV strain 05/30/2019 Echo with EF 40-45%. RV appears normal. There is septal flattening suggestive of RV pressure volume/overload. Personally reviewed  ROS: All systems negative except as listed in HPI, PMH and Problem List.  SH:  Social History   Socioeconomic History  . Marital status: Married    Spouse name: Not on file  . Number of children: 0  . Years of education: 35  . Highest  education level: Not on file  Occupational History  . Occupation: Librarian, academic at [employer]  . Financial resource strain: Not on file  . Food insecurity    Worry: Not on file    Inability: Not on file  . Transportation needs    Medical: Not on file    Non-medical: Not on file  Tobacco Use  . Smoking status: Current Every Day Smoker    Packs/day: 0.50    Years: 19.00    Pack years: 9.50    Types: Cigarettes    Start date: 01/05/1996    Last attempt to quit: 03/04/2015    Years since quitting: 4.5  . Smokeless tobacco: Never Used  . Tobacco comment: Already decreasaed smoking  Substance and Sexual Activity  . Alcohol use: No    Alcohol/week: 1.0 - 2.0 standard drinks    Types: 1 - 2 Standard drinks or equivalent per week    Comment: Occasional  . Drug use: No  . Sexual activity: Yes    Birth control/protection: Condom  Lifestyle  . Physical activity    Days per week: Not on file    Minutes per session: Not on file  . Stress: Not on file  Relationships  . Social Herbalist on phone: Not on file    Gets together: Not on file    Attends religious service: Not on file    Active member of club or organization: Not on file  Attends meetings of clubs or organizations: Not on file    Relationship status: Not on file  . Intimate partner violence    Fear of current or ex partner: Not on file    Emotionally abused: Not on file    Physically abused: Not on file    Forced sexual activity: Not on file  Other Topics Concern  . Not on file  Social History Narrative   Born and raised in [place]. Fun: Bowling, movies, we go   Denies religious beliefs that would effect health care.     FH:  Family History  Problem Relation Age of Onset  . Hypertension Mother   . Hyperlipidemia Mother   . Heart failure Mother   . Multiple sclerosis Mother   . Breast cancer Maternal Grandmother   . Diabetes Maternal Grandmother   . Colon cancer Maternal  Uncle     Past Medical History:  Diagnosis Date  . 2nd degree AV block    a. noted during 02/2015 admission - second degree AV AV block (not further defined in notes). Not on BB due to this.  . Asthma    Childhood  . Bradycardia    a. noted during 02/2015 admission - second degree AV AV block (not further defined in notes). Not on BB due to this.  . Chronic combined systolic and diastolic CHF (congestive heart failure) (HCC)    a. echo 03/06/15 showing mild LVH, EF 40%, high ventricular filling pressures, mod MR, mildly dilated RV/mildly reduced RV function, mildly dilated RA, PASP .  . Diabetes mellitus, type 2 (HCC)   . Essential hypertension   . Family history of adverse reaction to anesthesia    mother and aunt  both put to sleep for surgery and "stopped breathing"  . Morbid obesity (HCC)   . NSVT (nonsustained ventricular tachycardia) (HCC)    a. noted during 02/2015 admission.  . OSA (obstructive sleep apnea)    a. noncompliance with CPAP - wakes up with it off.    Current Outpatient Medications  Medication Sig Dispense Refill  . BIDIL 20-37.5 MG tablet TAKE 1 TABLET BY MOUTH THREE TIMES A DAY 270 tablet 1  . carvedilol (COREG) 12.5 MG tablet Take 1 tablet (12.5 mg total) by mouth 2 (two) times daily with a meal. 180 tablet 0  . sacubitril-valsartan (ENTRESTO) 97-103 MG Take 1 tablet by mouth 2 (two) times daily. 60 tablet 0  . spironolactone (ALDACTONE) 50 MG tablet Take 1 tablet (50 mg total) by mouth daily. 90 tablet 1  . torsemide (DEMADEX) 20 MG tablet Take 2 tablets (40 mg total) by mouth 2 (two) times daily. 360 tablet 0   No current facility-administered medications for this encounter.     Vitals:   10/05/19 1127  BP: (!) 150/92  Pulse: 97  SpO2: (!) 89%  Weight: (!) 170.2 kg (375 lb 3.2 oz)   Wt Readings from Last 3 Encounters:  10/05/19 (!) 170.2 kg (375 lb 3.2 oz)  09/07/19 (!) 172.3 kg (379 lb 12.8 oz)  07/27/19 (!) 170.1 kg (375 lb)     PHYSICAL  EXAM: General:  Well appearing. No resp difficulty HEENT: normal Neck: supple. no JVD. Carotids 2+ bilat; no bruits. No lymphadenopathy or thryomegaly appreciated. Cor: PMI nondisplaced. Regular rate & rhythm. No rubs, gallops or murmurs. Lungs: clear Abdomen: obese, soft, nontender, nondistended. No hepatosplenomegaly. No bruits or masses. Good bowel sounds. Extremities: no cyanosis, clubbing, rash, edema Neuro: alert & orientedx3, cranial nerves grossly intact.  moves all 4 extremities w/o difficulty. Affect pleasant   ASSESSMENT & PLAN: 1. Chronic systolic CHF with prominent RV dysfunction: Echo in 2018 showed EF 35-40%, diffuse hypokinesis, D-shaped interventricular septum, moderately dysfunctional RV. Suspect cor pulmonale. Cause of RV failure uncertain, he does have significant OSA. However, no evidence so far for OHS (oxygen saturation ok on room air during day so far). - - Echo 05/30/19 with EF 40-45%. RV appears normal. There is septal flattening suggestive of RV pressure volume/overload. -- R/L cath 6/15 . Normal cors.  - NYHA II. Functional improvement.  - Volume status stable. Continue torsemide 40 mg twice a day.  - Continue Entresto 97/103 bid - Continue spironolactone 50 daily, -Continue  Bidil 1 tab tid - Increase Coreg 18.75 mg bid.  2. OSA/?OHS: Reinforced daily CPAP use. Continue CPAP every night.  Followed by Dr Mayford Knife. He has not seen her in 2 years. We need to send him back to her.  3. 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  4. HTN: Elevated. Increase coreg as noted above.  5. Morbid obesity Body mass index is 49.5 kg/m.  Discussed portion control.  6. Tobacco Abuse Discussed cessation.   Follow up 2 months.  Check BMET next visit.  Levina Boyack NP-C  11:29 AM

## 2019-12-07 ENCOUNTER — Encounter (HOSPITAL_COMMUNITY): Payer: Self-pay

## 2019-12-12 ENCOUNTER — Telehealth: Payer: Self-pay | Admitting: Cardiology

## 2019-12-17 ENCOUNTER — Telehealth (HOSPITAL_COMMUNITY): Payer: Self-pay

## 2019-12-17 NOTE — Telephone Encounter (Signed)
Received pt's death certificate. Pt to be cremated. Death certificate signed by Dr. Aundra Dubin and copy faxed to [facility] and [facility]. Fax confirmation received. Called [facility] and made them aware original was ready for pick up in the front office.

## 2019-12-18 NOTE — Telephone Encounter (Signed)
Received call from EMS stating that patient was last seen alive at [facility] and then came home to find him unresponsive on the floor. Upon arrival CPR was started but unsuccessful.

## 2019-12-18 DEATH — deceased

## 2019-12-28 ENCOUNTER — Encounter (HOSPITAL_COMMUNITY): Payer: Self-pay

## 2020-01-14 ENCOUNTER — Other Ambulatory Visit: Payer: Self-pay | Admitting: Physician Assistant

## 2020-03-01 ENCOUNTER — Other Ambulatory Visit: Payer: Self-pay | Admitting: Physician Assistant
